# Patient Record
Sex: Female | Born: 1960 | Race: White | Hispanic: No | Smoking: Never smoker
Health system: Southern US, Community
[De-identification: ages and names within clinical notes are randomized; demographics above are authoritative.]

## PROBLEM LIST (undated history)

## (undated) DIAGNOSIS — R87610 Atypical squamous cells of undetermined significance on cytologic smear of cervix (ASC-US): Secondary | ICD-10-CM

## (undated) DIAGNOSIS — Z9889 Other specified postprocedural states: Secondary | ICD-10-CM

## (undated) DIAGNOSIS — D649 Anemia, unspecified: Secondary | ICD-10-CM

## (undated) DIAGNOSIS — J45909 Unspecified asthma, uncomplicated: Secondary | ICD-10-CM

## (undated) DIAGNOSIS — Z87442 Personal history of urinary calculi: Secondary | ICD-10-CM

## (undated) DIAGNOSIS — T7840XA Allergy, unspecified, initial encounter: Secondary | ICD-10-CM

## (undated) DIAGNOSIS — E7800 Pure hypercholesterolemia, unspecified: Secondary | ICD-10-CM

## (undated) DIAGNOSIS — R112 Nausea with vomiting, unspecified: Secondary | ICD-10-CM

## (undated) DIAGNOSIS — J302 Other seasonal allergic rhinitis: Secondary | ICD-10-CM

## (undated) DIAGNOSIS — K219 Gastro-esophageal reflux disease without esophagitis: Secondary | ICD-10-CM

## (undated) DIAGNOSIS — E039 Hypothyroidism, unspecified: Secondary | ICD-10-CM

## (undated) HISTORY — DX: Allergy, unspecified, initial encounter: T78.40XA

## (undated) HISTORY — DX: Gastro-esophageal reflux disease without esophagitis: K21.9

## (undated) HISTORY — DX: Pure hypercholesterolemia, unspecified: E78.00

## (undated) HISTORY — DX: Unspecified asthma, uncomplicated: J45.909

## (undated) HISTORY — DX: Other seasonal allergic rhinitis: J30.2

## (undated) HISTORY — DX: Hypothyroidism, unspecified: E03.9

## (undated) HISTORY — DX: Anemia, unspecified: D64.9

## (undated) HISTORY — DX: Atypical squamous cells of undetermined significance on cytologic smear of cervix (ASC-US): R87.610

## (undated) HISTORY — PX: FOOT SURGERY: SHX648

## (undated) HISTORY — PX: OTHER SURGICAL HISTORY: SHX169

## (undated) HISTORY — PX: COLONOSCOPY: SHX174

---

## 1998-08-12 HISTORY — PX: OTHER SURGICAL HISTORY: SHX169

## 2000-08-07 ENCOUNTER — Encounter (INDEPENDENT_AMBULATORY_CARE_PROVIDER_SITE_OTHER): Payer: Self-pay

## 2000-08-07 ENCOUNTER — Ambulatory Visit (HOSPITAL_COMMUNITY): Admission: RE | Admit: 2000-08-07 | Discharge: 2000-08-07 | Payer: Self-pay | Admitting: Obstetrics and Gynecology

## 2001-08-12 HISTORY — PX: CARPAL TUNNEL RELEASE: SHX101

## 2001-08-12 HISTORY — PX: LAPAROSCOPIC CHOLECYSTECTOMY: SUR755

## 2002-01-27 ENCOUNTER — Ambulatory Visit (HOSPITAL_COMMUNITY): Admission: RE | Admit: 2002-01-27 | Discharge: 2002-01-27 | Payer: Self-pay | Admitting: Gastroenterology

## 2002-01-27 ENCOUNTER — Encounter: Payer: Self-pay | Admitting: Gastroenterology

## 2002-01-29 ENCOUNTER — Encounter: Payer: Self-pay | Admitting: Gastroenterology

## 2002-01-29 ENCOUNTER — Ambulatory Visit (HOSPITAL_COMMUNITY): Admission: RE | Admit: 2002-01-29 | Discharge: 2002-01-29 | Payer: Self-pay | Admitting: Gastroenterology

## 2002-02-23 ENCOUNTER — Observation Stay (HOSPITAL_COMMUNITY): Admission: RE | Admit: 2002-02-23 | Discharge: 2002-02-23 | Payer: Self-pay | Admitting: *Deleted

## 2002-02-23 ENCOUNTER — Encounter (INDEPENDENT_AMBULATORY_CARE_PROVIDER_SITE_OTHER): Payer: Self-pay | Admitting: Specialist

## 2007-01-12 ENCOUNTER — Ambulatory Visit: Payer: Self-pay | Admitting: Gastroenterology

## 2007-01-22 ENCOUNTER — Encounter: Payer: Self-pay | Admitting: Gastroenterology

## 2007-01-22 ENCOUNTER — Ambulatory Visit (HOSPITAL_COMMUNITY): Admission: RE | Admit: 2007-01-22 | Discharge: 2007-01-22 | Payer: Self-pay | Admitting: Gastroenterology

## 2007-01-22 DIAGNOSIS — K222 Esophageal obstruction: Secondary | ICD-10-CM | POA: Insufficient documentation

## 2007-01-30 ENCOUNTER — Ambulatory Visit: Payer: Self-pay | Admitting: Gastroenterology

## 2009-08-29 ENCOUNTER — Ambulatory Visit: Payer: Self-pay | Admitting: Gynecology

## 2009-08-31 ENCOUNTER — Ambulatory Visit: Payer: Self-pay | Admitting: Gynecology

## 2009-12-22 ENCOUNTER — Ambulatory Visit: Payer: Self-pay | Admitting: Gynecology

## 2010-02-06 ENCOUNTER — Ambulatory Visit: Payer: Self-pay | Admitting: Gynecology

## 2010-08-21 ENCOUNTER — Other Ambulatory Visit
Admission: RE | Admit: 2010-08-21 | Discharge: 2010-08-21 | Payer: Self-pay | Source: Home / Self Care | Admitting: Gynecology

## 2010-08-21 ENCOUNTER — Ambulatory Visit
Admission: RE | Admit: 2010-08-21 | Discharge: 2010-08-21 | Payer: Self-pay | Source: Home / Self Care | Attending: Gynecology | Admitting: Gynecology

## 2010-12-25 NOTE — Assessment & Plan Note (Signed)
[facility]                         GASTROENTEROLOGY OFFICE NOTE   NAME:STOVALLGustavia, Carie                       MRN:          [id number]  DATE:01/12/2007                            DOB:          [birth year]    REFERRING PHYSICIAN:  Samuel Jester   REASON FOR CONSULTATION:  Ms. Louis Meckel is a 50 year old, white female  referred through the courtesy of Dr. Charm Barges and Aniceto Boss for  evaluation. She has been complaining of band-like chest discomfort. This  occurred spontaneously. It is without radiation. She is complaining of  dysphagia to solids. Symptoms have improved since taking Omeprazole. She  had been taking milk with some relief. She denies pyrosis, per say. She  is on no gastric irritants including nonsteroidals.   PAST MEDICAL HISTORY:  Pertinent for thyroid disease. She is status post  cholecystectomy.   FAMILY HISTORY:  Pertinent for mother with ovarian cancer. Father has  heart disease.   MEDICATIONS:  Synthroid, omeprazole, Hyoscyamine, Atarax, Zocor, and  Yaz.   ALLERGIES:  TEQUIN.   She neither smokes nor drinks. She is married and works in [employer].   REVIEW OF SYSTEMS:  Positive for night sweats.   PHYSICAL EXAMINATION:  VITAL SIGNS:  Pulse 76, blood pressure 106/78,  weight 155.  HEENT: EOMI. PERRLA. Sclerae are anicteric.  Conjunctivae are pink.  NECK:  Supple without thyromegaly, adenopathy or carotid bruits.  CHEST:  Clear to auscultation and percussion without adventitious  sounds.  CARDIAC:  Regular rhythm; normal S1 S2.  There are no murmurs, gallops  or rubs.  ABDOMEN:  Bowel sounds are normoactive.  Abdomen is soft, non-tender and  non-distended.  There are no abdominal masses, tenderness, splenic  enlargement or hepatomegaly.  EXTREMITIES:  Full range of motion.  No cyanosis, clubbing or edema.  RECTAL:  Deferred.   IMPRESSION:  1. Chest pain. This is likely related to acid reflux with possible      esophageal  spasm.  2. Dysphagia - rule out early esophageal stricture.   RECOMMENDATION:  1. Continue Omeprazole. I instructed Ms. Stovall to take it first      thing in the morning rather      than in the evening.  2. Upper endoscopy with Savary dilatation as indicated.     Barbette Hair. Arlyce Dice, MD,FACG  Electronically Signed    RDK/MedQ  DD: 01/12/2007  DT: 01/12/2007  Job #: [id number]   cc:   Jani Gravel, PA

## 2010-12-25 NOTE — Letter (Signed)
January 12, 2007    Carol Jester, MD  [address]  [place], [place] [address]   RE:  Carol Chambers, Carol Chambers  MRN:  [id number]  /  DOB:  Sep 20, 1960   Dear Dr. Charm Barges:   Upon your kind referral, I had the pleasure of evaluating your patient  and I am pleased to offer my findings.  I saw Carol Chambers in the office  today.  Enclosed is a copy of my progress note that details my findings  and recommendations.   Thank you for the opportunity to participate in your patient's care.    Sincerely,      Barbette Hair. Arlyce Dice, MD,FACG  Electronically Signed    RDK/MedQ  DD: 01/12/2007  DT: 01/12/2007  Job #: [id number]

## 2010-12-28 NOTE — Op Note (Signed)
[facility]  Patient:    Carol Chambers, Carol Chambers Visit Number: [id number] MRN: [id number]          Service Type: SUR Location: 4W [id number] [id number] Attending Physician:  Vikki Ports Proc. Date: 02/23/02 Admit Date:  02/23/2002 Discharge Date: 02/23/2002                             Operative Report  PREOPERATIVE DIAGNOSIS:  Symptomatic cholelithiasis.  POSTOPERATIVE DIAGNOSIS:  Symptomatic cholelithiasis.  PROCEDURE PERFORMED:  Laparoscopic cholecystectomy.  SURGEON:  Vikki Ports, M.D.  ASSISTANT:  Rose Phi. Young, M.D.  DESCRIPTION OF PROCEDURE:  The patient was taken to the operating room and placed in a supine position.  After adequate anesthesia was induced using the endotracheal tube, the abdomen was prepped and draped in normal sterile fashion.  Using a transverse infraumbilical incision, I dissected down to the fascia.  The fascia was opened vertically.  The peritoneum was entered.  An 0 Vicryl pursestring suture was placed around the fascial defect.  A Hasson trocar was placed in the abdomen and pneumoperitoneum was obtained with continuous flow carbon dioxide to a measurement of 15 mmHg.  Under direct visualization, a 10 mm port was placed in the subxiphoid region, two 5 mm ports were placed in the right abdomen.  The gallbladder was identified and had no adhesions, no edema.  It was grasped and retracted cephalad.  Each dissection down near the infundibulum showed a 1 mm cystic duct.  A good window was created behind it until the liver could be visualized.  The cystic duct was triply clipped and divided.  The cystic artery which lay just posterior to this also was dissected free and a good window was created behind it.  It was triply clipped and divided.  The gallbladder was on a nice thin mesentery and was easily dissected off the gallbladder bed and removed through the umbilical port.  Adequate hemostasis was ensured.  The  pneumoperitoneum was released.  The infraumbilical fascial defect was closed with the 0 Vicryl pursestring suture.  Incisions were all injected using 0.5% Marcaine.  The skin incisions were closed with subcuticular 4-0 Monocryl.  Steri-Strips and sterile dressings were applied.  The patient tolerated the procedure well and went the PACU in good condition. Attending Physician:  Danna Hefty R. DD:  02/23/02 TD:  02/25/02 Job: [id number]

## 2010-12-28 NOTE — Op Note (Signed)
[facility]  Patient:    Carol Chambers                         MRN: [id number] Attending:  Katherine Roan, M.D.                           Operative Report  PREOPERATIVE DIAGNOSIS:       Painful occlusion cyst of the right groin.  POSTOPERATIVE DIAGNOSIS:      Painful occlusion cyst of the right groin.  OPERATION:                    Excision of occlusion cyst of the right groin.  SURGEON:                      Katherine Roan, M.D.  DESCRIPTION OF PROCEDURE:     The patient was placed in the lithotomy position, prepped and draped in the usual fashion.  A small cyst was identified on the right labia.  It was infiltrated with 0.5% Marcaine with epinephrine and excised in an elliptical fashion.  The cyst was excised and the skin was closed with 4-0 Dexon.  Hemostasis was accomplished with needlepoint Bovie.  The patient tolerated this procedure well and was sent to the recovery room in good condition. DD:  08/07/00 TD:  08/07/00 Job: [id number]

## 2011-01-28 ENCOUNTER — Ambulatory Visit (INDEPENDENT_AMBULATORY_CARE_PROVIDER_SITE_OTHER): Payer: BC Managed Care – PPO | Admitting: Gynecology

## 2011-01-28 DIAGNOSIS — N898 Other specified noninflammatory disorders of vagina: Secondary | ICD-10-CM

## 2011-01-28 DIAGNOSIS — R350 Frequency of micturition: Secondary | ICD-10-CM

## 2011-01-28 DIAGNOSIS — B373 Candidiasis of vulva and vagina: Secondary | ICD-10-CM

## 2011-09-03 ENCOUNTER — Other Ambulatory Visit (HOSPITAL_COMMUNITY)
Admission: RE | Admit: 2011-09-03 | Discharge: 2011-09-03 | Disposition: A | Discharge status: Home / Self Care | Payer: BC Managed Care – PPO | Source: Ambulatory Visit | Attending: Gynecology | Admitting: Gynecology

## 2011-09-03 ENCOUNTER — Encounter: Payer: Self-pay | Admitting: Gynecology

## 2011-09-03 ENCOUNTER — Ambulatory Visit (INDEPENDENT_AMBULATORY_CARE_PROVIDER_SITE_OTHER): Payer: BC Managed Care – PPO | Admitting: Gynecology

## 2011-09-03 VITALS — BP 114/70 | Ht 63.0 in | Wt 189.0 lb

## 2011-09-03 DIAGNOSIS — N951 Menopausal and female climacteric states: Secondary | ICD-10-CM

## 2011-09-03 DIAGNOSIS — Z13220 Encounter for screening for lipoid disorders: Secondary | ICD-10-CM

## 2011-09-03 DIAGNOSIS — N926 Irregular menstruation, unspecified: Secondary | ICD-10-CM

## 2011-09-03 DIAGNOSIS — Z01419 Encounter for gynecological examination (general) (routine) without abnormal findings: Secondary | ICD-10-CM | POA: Insufficient documentation

## 2011-09-03 DIAGNOSIS — Z131 Encounter for screening for diabetes mellitus: Secondary | ICD-10-CM

## 2011-09-03 DIAGNOSIS — K219 Gastro-esophageal reflux disease without esophagitis: Secondary | ICD-10-CM | POA: Insufficient documentation

## 2011-09-03 LAB — URINALYSIS W MICROSCOPIC + REFLEX CULTURE
Bilirubin Urine: NEGATIVE
Leukocytes, UA: NEGATIVE
Nitrite: NEGATIVE
Protein, ur: NEGATIVE mg/dL
Specific Gravity, Urine: 1.015 (ref 1.005–1.030)
Urobilinogen, UA: NEGATIVE mg/dL (ref 0.0–1.0)

## 2011-09-03 LAB — TSH: TSH: 1.36 u[IU]/mL (ref 0.350–4.500)

## 2011-09-03 LAB — COMPREHENSIVE METABOLIC PANEL
ALT: 14 U/L (ref 0–35)
AST: 17 U/L (ref 0–37)
Albumin: 4.4 g/dL (ref 3.5–5.2)
Alkaline Phosphatase: 71 U/L (ref 39–117)
BUN: 16 mg/dL (ref 6–23)
Calcium: 9.6 mg/dL (ref 8.4–10.5)
Chloride: 103 mEq/L (ref 96–112)
Potassium: 4.5 mEq/L (ref 3.5–5.3)
Sodium: 138 mEq/L (ref 135–145)
Total Protein: 7 g/dL (ref 6.0–8.3)

## 2011-09-03 LAB — CBC WITH DIFFERENTIAL/PLATELET
Basophils Absolute: 0.1 10*3/uL (ref 0.0–0.1)
Eosinophils Relative: 2 % (ref 0–5)
HCT: 43.9 % (ref 36.0–46.0)
Lymphocytes Relative: 26 % (ref 12–46)
Lymphs Abs: 2.5 10*3/uL (ref 0.7–4.0)
MCV: 91.5 fL (ref 78.0–100.0)
Neutro Abs: 6 10*3/uL (ref 1.7–7.7)
Platelets: 296 10*3/uL (ref 150–400)
RBC: 4.8 MIL/uL (ref 3.87–5.11)
RDW: 13.6 % (ref 11.5–15.5)
WBC: 9.5 10*3/uL (ref 4.0–10.5)

## 2011-09-03 LAB — LIPID PANEL: LDL Cholesterol: 121 mg/dL — ABNORMAL HIGH (ref 0–99)

## 2011-09-03 MED ORDER — DIAZEPAM 5 MG PO TABS: 5.0000 mg | ORAL_TABLET | Freq: Three times a day (TID) | ORAL | Status: AC | PRN

## 2011-09-03 MED ORDER — NORETHIN ACE-ETH ESTRAD-FE 1-20 MG-MCG PO TABS: 1.0000 | ORAL_TABLET | Freq: Every day | ORAL | Status: DC

## 2011-09-03 NOTE — Patient Instructions (Signed)
Follow up for lab results.  Return in one year for gyn check up.

## 2011-09-03 NOTE — Progress Notes (Signed)
Carol Chambers 03-Apr-1961 [id number]        51 y.o.  for annual exam.  Doing well. On low dose oral contraceptives. Does note some night sweats throughout the month on and off.  Past medical history,surgical history, medications, allergies, family history and social history were all reviewed and documented in the EPIC chart. ROS:  Was performed and pertinent positives and negatives are included in the history.  Exam: Sherrilyn Rist chaperone present There were no vitals filed for this visit. General appearance  Normal Skin grossly normal Head/Neck normal with no cervical or supraclavicular adenopathy thyroid normal Lungs  clear Cardiac RR, without RMG Abdominal  soft, nontender, without masses, organomegaly or hernia Breasts  examined lying and sitting without masses, retractions, discharge or axillary adenopathy. Pelvic  Ext/BUS/vagina  normal   Cervix  normal  Pap done  Uterus  anteverted, normal size, shape and contour, midline and mobile nontender   Adnexa  Without masses or tenderness    Anus and perineum  normal   Rectovaginal  normal sphincter tone without palpated masses or tenderness.    Assessment/Plan:  51 y.o. female for annual exam.    1. Night sweats. Throughout the month does not seem worse during her pill free week.  No other symptoms such as hair loss skin changes weight changes. She is on thyroid replacement. We'll check baseline FSH, TSH. 2. Irregular menses. Patient does have skips in her periods when she does not have a withdrawal bleed and some that are very light. Certainly may be normal for her low dose pills but will go ahead and check a baseline prolactin along with her FSH TSH. 3. Contraception. Patient wants to continue on low dose oral contraceptives. I again discussed the risks to include stroke heart attack DVT all of which she understands accepts. She does not smoke she's not beening followed for any medical issues and I refilled her times a year with Loestrin 1/20  equivalent. 4. History of vaginismus. She is doing well from that standpoint. She does use Valium to help with muscle relaxation as well as some anxiety issues. I refilled her Valium 5 mg #90 no refill which seems to last year. 5. Breast health. Patient due for her mammogram in April I reminded her to schedule this. SBE monthly discussed. 6. Pap smear. Pap smear was done today as she does not have a number of normals in her chart yet. She has no history of abnormalities in the past.  Assuming this is normal then we may go to a less frequent screening interval will rediscuss next year. 7. Health maintenance. She asked me to do her baseline labs for her internist and that she would fax them to her who is following her for elevated cholesterol and hypothyroidism. I ordered a CBC comprehensive metabolic panel TSH lipid profile and urinalysis. Patient will follow up for all of her labs and I will discuss if any abnormalities otherwise she will see me in a year.    Dara Lords MD, 9:27 AM 09/03/2011

## 2011-09-04 LAB — PROLACTIN: Prolactin: 6.9 ng/mL

## 2011-10-28 ENCOUNTER — Ambulatory Visit
Admission: RE | Admit: 2011-10-28 | Discharge: 2011-10-28 | Disposition: A | Discharge status: Home / Self Care | Payer: BC Managed Care – PPO | Source: Ambulatory Visit | Attending: Allergy and Immunology | Admitting: Allergy and Immunology

## 2011-10-28 ENCOUNTER — Other Ambulatory Visit: Payer: Self-pay | Admitting: Allergy and Immunology

## 2011-10-28 ENCOUNTER — Telehealth: Payer: Self-pay | Admitting: Gastroenterology

## 2011-10-28 DIAGNOSIS — R059 Cough, unspecified: Secondary | ICD-10-CM

## 2011-10-28 DIAGNOSIS — R05 Cough: Secondary | ICD-10-CM

## 2011-10-28 NOTE — Telephone Encounter (Signed)
Pt states that her allergist has been treating her for infections and she has had several medications and steroids. He suggested she see Dr. Arlyce Dice because he thinks she may have damage to her vocal cords or esophagus. Let pt know Dr. Marzetta Board 1st available is in April. Pt scheduled to see Mike Gip PA Thursday 10/31/11@11am . Pt aware of appt date and time.

## 2011-10-31 ENCOUNTER — Encounter: Payer: Self-pay | Admitting: Physician Assistant

## 2011-10-31 ENCOUNTER — Ambulatory Visit (INDEPENDENT_AMBULATORY_CARE_PROVIDER_SITE_OTHER): Payer: BC Managed Care – PPO | Admitting: Physician Assistant

## 2011-10-31 VITALS — BP 110/70 | HR 68 | Ht 63.0 in | Wt 184.0 lb

## 2011-10-31 DIAGNOSIS — E039 Hypothyroidism, unspecified: Secondary | ICD-10-CM

## 2011-10-31 DIAGNOSIS — E785 Hyperlipidemia, unspecified: Secondary | ICD-10-CM

## 2011-10-31 DIAGNOSIS — K219 Gastro-esophageal reflux disease without esophagitis: Secondary | ICD-10-CM

## 2011-10-31 MED ORDER — DEXLANSOPRAZOLE 60 MG PO CPDR: 60.0000 mg | DELAYED_RELEASE_CAPSULE | Freq: Every day | ORAL | Status: DC

## 2011-10-31 NOTE — Patient Instructions (Signed)
We sent a prescription for the Dexilant to [facility].  We have given you some samples today. Take 1 tab 30 min before breakfast. We scheduled the Endoscopy with Dr Arlyce Dice for 11-07-2011.                                                                                                                ENDOSCOPY .  Upper GI endoscopy means using a flexible scope to look at the esophagus, stomach, and upper small bowel. This is done to make a diagnosis in people with heartburn, abdominal pain, or abnormal bleeding. Sometimes an endoscope is needed to remove foreign bodies or food that become stuck in the esophagus; it can also be used to take biopsy samples. For the best results, do not eat or drink for 8 hours before having your upper endoscopy.  To perform the endoscopy, you will probably be sedated and your throat will be numbed with a special spray. The endoscope is then slowly passed down your throat (this will not interfere with your breathing). An endoscopy exam takes 15 to 30 minutes to complete and there is no real pain. Patients rarely remember much about the procedure. The results of the test may take several days if a biopsy or other test is taken.  You may have a sore throat after an endoscopy exam. Serious complications are very rare. Stick to liquids and soft foods until your pain is better. Do not drive a car or operate any dangerous equipment for at least 24 hours after being sedated. SEEK IMMEDIATE MEDICAL CARE IF:   You have severe throat pain.   You have shortness of breath.   You have bleeding problems.   You have a fever.   You have difficulty recovering from your sedation.  Document Released: 09/05/2004 Document Revised: 07/18/2011 Document Reviewed: 07/31/2008 [facility] Patient Information 2012 [place], [place].

## 2011-10-31 NOTE — Progress Notes (Signed)
Subjective:    Patient ID: Carol Chambers, female    DOB: Oct 19, 1960, 51 y.o.   MRN: [id number]  HPI Carol Chambers is very nice 51 year old white female known to Dr. Arlyce Dice from prior endoscopies. She initially had an upper endoscopy done in 2003 which was a normal exam. She then had repeat EGD in June of 2008 with complaints of dysphagia and was found to have a distal esophageal stricture which was balloon dilated to 18 mm. She had good response from last dilation. She says she has had problems with chronic reflux for several years but had done well on Prilosec. Over the past couple of months she has had an episode of bronchitis which has been very difficult to resolve. She is being treated currently by her allergist and says that she has gone through courses of antibiotics, steroids, and inhalers and continues to have complaints of coughing, hoarseness and irritation in her throat. Her allergist suggested that she may be having increasing reflux problems and ask her to be seen by GI.  Patient states that she has had problems swallowing solids over the past couple of months and has actually been avoiding of meat and dry foods for fear of dysphagia.She says she's also having difficulty drinking anything cold because it seems to induce spasms which then causes her to have coughing and choking. She has had some increase in  indigestion but is not having ongoing heartburn. She has been maintained over the past few years on Prilosec 20 mg by mouth daily. She says she is having a lot of problems at night and wakes up nightly with a sense of fluid coming up in her throat which then causes her to choke and have coughing spells. She says she gets to the point she can't stop coughing and then makes a loud wheezing type noise. She is staying hoarse  during the day and also having frequent coughing spells during the day.   Review of Systems  Constitutional: Negative.   HENT: Positive for trouble swallowing and voice change.    Eyes: Negative.   Respiratory: Positive for cough and choking.   Cardiovascular: Negative.   Genitourinary: Negative.   Musculoskeletal: Negative.   Neurological: Negative.  Negative for dizziness.  Hematological: Negative.   Psychiatric/Behavioral: Negative.    Outpatient Prescriptions Prior to Visit  Medication Sig Dispense Refill  . HydrOXYzine HCl (ATARAX PO) Take 25 mg by mouth daily.       Marland Kitchen levothyroxine (SYNTHROID, LEVOTHROID) 112 MCG tablet Take 112 mcg by mouth daily.        Marland Kitchen MAGNESIUM PO Take by mouth.        . naproxen sodium (ANAPROX) 220 MG tablet Take 220 mg by mouth as needed.        . norethindrone-ethinyl estradiol (LOESTRIN FE 1/20) 1-20 MG-MCG tablet Take 1 tablet by mouth daily.  3 Package  4  . omeprazole (PRILOSEC) 20 MG capsule Take 20 mg by mouth daily.      . simvastatin (ZOCOR) 20 MG tablet Take 20 mg by mouth at bedtime.         Allergies  Allergen Reactions  . Sulfa Antibiotics   . Tequin   . Vicodin (Hydrocodone-Acetaminophen)    Patient Active Problem List  Diagnoses  . ESOPHAGEAL STRICTURE  . Acid reflux  . Hypothyroid  . Hyperlipidemia       Objective:   Physical Exam Well-developed white female in no acute distress, pleasant somewhat hoarse and coughing   blood  pressure 110/70 pulse 68 height 5 foot 3  weight 184, HEENT;  Nontraumatic, normocephalic, EOMI PERRLA sclera anicteric conjunctiva pink, voice with hoarse quality, Neck; Supple no JVD, Cardiovascular; regular rate and rhythm with S1-S2 no murmur gallop, Pulmonary; clear bilaterally, Abdomen; sof,t nontender, nondistended, bowel sounds are active there is no palpable mass or hepatosplenomegaly, Rectal; not done, Extremities; no clubbing cyanosis or edema skin warm dry, Psych; mood and affect normal and appropriate.        Assessment & Plan:  #7 51 year old female with history of chronic GERD and prior esophageal stricture dilated in 2008 now presenting with recurrent solid food  dysphagia as well as nocturnal episodes of reflux-induced coughing hoarseness and choking and possible reflux-induced asthmatic symptoms.  Plan; Will schedule for upper endoscopy with esophageal dilation with Dr. Arlyce Dice as soon as possible. Start Dexilant  60 mg by mouth every morning Antireflux regimen was reviewed in detail with the patient including need for elevation of the head of her bed at least 45 and n.p.o. for at least 2 hours prior to bedtime  each night. Further plans will be dependent on her response to dilation and maximal acid suppression with Dexilant

## 2011-11-01 NOTE — Progress Notes (Signed)
Reviewed and agree with management. Robert D. Kaplan, M.D., FACG  

## 2011-11-07 ENCOUNTER — Ambulatory Visit (AMBULATORY_SURGERY_CENTER): Payer: BC Managed Care – PPO | Admitting: Gastroenterology

## 2011-11-07 ENCOUNTER — Encounter: Payer: Self-pay | Admitting: Gastroenterology

## 2011-11-07 VITALS — BP 138/66 | HR 89 | Temp 99.2°F | Resp 99 | Ht 63.0 in | Wt 184.0 lb

## 2011-11-07 DIAGNOSIS — K222 Esophageal obstruction: Secondary | ICD-10-CM

## 2011-11-07 DIAGNOSIS — K219 Gastro-esophageal reflux disease without esophagitis: Secondary | ICD-10-CM

## 2011-11-07 MED ORDER — SODIUM CHLORIDE 0.9 % IV SOLN: 500.0000 mL | INTRAVENOUS | Status: DC

## 2011-11-07 NOTE — Op Note (Signed)
[facility] [address] [place], [place]  [address]  ENDOSCOPY PROCEDURE REPORT  PATIENT:  Carol Chambers, Carol Chambers  MR#:  [id number] BIRTHDATE:  1960-12-22, 51 yrs. old  GENDER:  female  ENDOSCOPIST:  Barbette Hair. Arlyce Dice, MD Referred by:  PROCEDURE DATE:  11/07/2011 PROCEDURE:  EGD, diagnostic 43235, Maloney Dilation of Esophagus ASA CLASS:  Class I INDICATIONS:  dysphagia  MEDICATIONS:   MAC sedation, administered by CRNA propofol 120mg IV, glycopyrrolate (Robinal) 0.2 mg IV, 0.6cc simethancone 0.6 cc PO TOPICAL ANESTHETIC:  DESCRIPTION OF PROCEDURE:   After the risks and benefits of the procedure were explained, informed consent was obtained.  The [facility] [id number] [id number] endoscope was introduced through the mouth and advanced to the third portion of the duodenum.  The instrument was slowly withdrawn as the mucosa was fully examined. <<PROCEDUREIMAGES>>  A stricture was found at the gastroesophageal junction. Early esophageal stricture Dilation with maloney dilator 18mm Mild resistance; no heme  Otherwise the examination was normal (see image2 and image3).    Retroflexed views revealed no abnormalities.    The scope was then withdrawn from the patient and the procedure completed.  COMPLICATIONS:  None  ENDOSCOPIC IMPRESSION: 1) Stricture at the gastroesophageal junction - s/p maloney dilitation 2) GERD 2) Otherwise normal examination RECOMMENDATIONS: 1) continue PPI - dexilant 2) Call office next 2-3 days to schedule an office appointment for 3-4 weeks  ______________________________ Barbette Hair. Arlyce Dice, MD  CC:  Edilia Bo MD  n. Rosalie DoctorBarbette Hair. Abed Schar at 11/07/2011 11:52 AM  Clearance Coots, [phone]

## 2011-11-07 NOTE — Progress Notes (Signed)
HUSBAND NOT PRESENT AT TIME OF MD CONSULTATION FOLLOWING PROCEDURE. CALLED HUSBAND ON CELL NUMBER, NO ANSWER, LEFT MESSAGE FOR HUSBAND TO RETURN TO [facility]. HUSBAND RETURNED AT 1205. CALLED DR. KAPLAN, MESSAGE GIVEN TO PATIENT'S HUSBAND PER MARIE MCCRAW RN. PATIENT STATING SHE REMEMBERS POST PROCEDURE REPORT WITH DR. KAPLAN AND HIS FINDINGS. HUSBAND VERBALIZED SATISFACTION WITH REPORT.

## 2011-11-07 NOTE — Progress Notes (Signed)
PROPOFOL PER [place] AMP CRNA. EWM

## 2011-11-07 NOTE — Progress Notes (Signed)
Patient did not experience any of the following events: a burn prior to discharge; a fall within the facility; wrong site/side/patient/procedure/implant event; or a hospital transfer or hospital admission upon discharge from the facility. (G8907) Patient did not have preoperative order for IV antibiotic SSI prophylaxis. (G8918)  

## 2011-11-07 NOTE — Patient Instructions (Addendum)
OV 3-4 weeks  NOTHING TO EAT OR DRINK UNTIL 1:00 PM. CLEAR LIQUIDS ONLY FROM 1:00 UNTIL 2:00. SOFT FOODS ONLY FOR THE REMAINDER OF THE DAY. RESUME NORMAL DIET IN AM.YOU HAD AN ENDOSCOPIC PROCEDURE TODAY AT THE [facility]: Refer to the procedure report that was given to you for any specific questions about what was found during the examination.  If the procedure report does not answer your questions, please call your gastroenterologist to clarify.  If you requested that your care partner not be given the details of your procedure findings, then the procedure report has been included in a sealed envelope for you to review at your convenience later.  YOU SHOULD EXPECT: Some feelings of bloating in the abdomen. Passage of more gas than usual.  Walking can help get rid of the air that was put into your GI tract during the procedure and reduce the bloating. If you had a lower endoscopy (such as a colonoscopy or flexible sigmoidoscopy) you may notice spotting of blood in your stool or on the toilet paper. If you underwent a bowel prep for your procedure, then you may not have a normal bowel movement for a few days.  DIET: Your first meal following the procedure should be a light meal and then it is ok to progress to your normal diet.  A half-sandwich or bowl of soup is an example of a good first meal.  Heavy or fried foods are harder to digest and may make you feel nauseous or bloated.  Likewise meals heavy in dairy and vegetables can cause extra gas to form and this can also increase the bloating.  Drink plenty of fluids but you should avoid alcoholic beverages for 24 hours.  ACTIVITY: Your care partner should take you home directly after the procedure.  You should plan to take it easy, moving slowly for the rest of the day.  You can resume normal activity the day after the procedure however you should NOT DRIVE or use heavy machinery for 24 hours (because of the sedation medicines used during the  test).    SYMPTOMS TO REPORT IMMEDIATELY: A gastroenterologist can be reached at any hour.  During normal business hours, 8:30 AM to 5:00 PM Monday through Friday, call [phone].  After hours and on weekends, please call the GI answering service at [phone] who will take a message and have the physician on call contact you.   Following lower endoscopy (colonoscopy or flexible sigmoidoscopy):  Excessive amounts of blood in the stool  Significant tenderness or worsening of abdominal pains  Swelling of the abdomen that is new, acute  Fever of 100F or higher  Following upper endoscopy (EGD)  Vomiting of blood or coffee ground material  New chest pain or pain under the shoulder blades  Painful or persistently difficult swallowing  New shortness of breath  Fever of 100F or higher  Black, tarry-looking stools  FOLLOW UP: If any biopsies were taken you will be contacted by phone or by letter within the next 1-3 weeks.  Call your gastroenterologist if you have not heard about the biopsies in 3 weeks.  Our staff will call the home number listed on your records the next business day following your procedure to check on you and address any questions or concerns that you may have at that time regarding the information given to you following your procedure. This is a courtesy call and so if there is no answer at the home number and  we have not heard from you through the emergency physician on call, we will assume that you have returned to your regular daily activities without incident.  SIGNATURES/CONFIDENTIALITY: You and/or your care partner have signed paperwork which will be entered into your electronic medical record.  These signatures attest to the fact that that the information above on your After Visit Summary has been reviewed and is understood.  Full responsibility of the confidentiality of this discharge information lies with you and/or your care-partner.

## 2011-11-11 ENCOUNTER — Telehealth: Payer: Self-pay | Admitting: *Deleted

## 2011-11-11 NOTE — Telephone Encounter (Signed)
  Follow up Call-  Call back number 11/07/2011  Post procedure Call Back phone  # [phone] cell  Permission to leave phone message Yes     No answer at # given.  Message left on voicemail.

## 2011-12-11 ENCOUNTER — Encounter: Payer: Self-pay | Admitting: Gastroenterology

## 2011-12-11 ENCOUNTER — Ambulatory Visit (INDEPENDENT_AMBULATORY_CARE_PROVIDER_SITE_OTHER): Payer: BC Managed Care – PPO | Admitting: Gastroenterology

## 2011-12-11 VITALS — BP 102/72 | HR 76 | Ht 63.0 in | Wt 182.2 lb

## 2011-12-11 DIAGNOSIS — K219 Gastro-esophageal reflux disease without esophagitis: Secondary | ICD-10-CM

## 2011-12-11 DIAGNOSIS — K222 Esophageal obstruction: Secondary | ICD-10-CM

## 2011-12-11 NOTE — Patient Instructions (Signed)
Call back as needed 

## 2011-12-11 NOTE — Assessment & Plan Note (Signed)
Resolved following dilatation

## 2011-12-11 NOTE — Progress Notes (Signed)
History of Present Illness:  Mrs. Carol Chambers has returned following dilatation of a distal esophagus stricture. Dysphagia has subsided. She has stopped coughing since starting dexilant.    Review of Systems: Pertinent positive and negative review of systems were noted in the above HPI section. All other review of systems were otherwise negative.    Current Medications, Allergies, Past Medical History, Past Surgical History, Family History and Social History were reviewed in [employer] electronic medical record  Vital signs were reviewed in today's medical record. Physical Exam: General: Well developed , well nourished, no acute distress

## 2011-12-11 NOTE — Assessment & Plan Note (Signed)
Well controlled with dexilant.

## 2011-12-13 ENCOUNTER — Encounter: Payer: Self-pay | Admitting: Gynecology

## 2012-09-03 ENCOUNTER — Ambulatory Visit (INDEPENDENT_AMBULATORY_CARE_PROVIDER_SITE_OTHER): Payer: BC Managed Care – PPO | Admitting: Gynecology

## 2012-09-03 ENCOUNTER — Encounter: Payer: Self-pay | Admitting: Gynecology

## 2012-09-03 VITALS — BP 120/76 | Ht 62.0 in | Wt 159.0 lb

## 2012-09-03 DIAGNOSIS — Z13220 Encounter for screening for lipoid disorders: Secondary | ICD-10-CM

## 2012-09-03 DIAGNOSIS — N912 Amenorrhea, unspecified: Secondary | ICD-10-CM

## 2012-09-03 DIAGNOSIS — E039 Hypothyroidism, unspecified: Secondary | ICD-10-CM

## 2012-09-03 DIAGNOSIS — N951 Menopausal and female climacteric states: Secondary | ICD-10-CM

## 2012-09-03 DIAGNOSIS — Z01419 Encounter for gynecological examination (general) (routine) without abnormal findings: Secondary | ICD-10-CM

## 2012-09-03 LAB — COMPREHENSIVE METABOLIC PANEL
ALT: 19 U/L (ref 0–35)
CO2: 29 mEq/L (ref 19–32)
Calcium: 9.3 mg/dL (ref 8.4–10.5)
Chloride: 105 mEq/L (ref 96–112)
Creat: 0.91 mg/dL (ref 0.50–1.10)

## 2012-09-03 LAB — CBC WITH DIFFERENTIAL/PLATELET
Eosinophils Absolute: 0.2 10*3/uL (ref 0.0–0.7)
Eosinophils Relative: 2 % (ref 0–5)
HCT: 42.7 % (ref 36.0–46.0)
Hemoglobin: 14.7 g/dL (ref 12.0–15.0)
Lymphs Abs: 2.1 10*3/uL (ref 0.7–4.0)
MCH: 30.2 pg (ref 26.0–34.0)
MCV: 87.9 fL (ref 78.0–100.0)
Monocytes Relative: 9 % (ref 3–12)
RBC: 4.86 MIL/uL (ref 3.87–5.11)

## 2012-09-03 LAB — LIPID PANEL
Cholesterol: 152 mg/dL (ref 0–200)
Total CHOL/HDL Ratio: 2.5 Ratio

## 2012-09-03 LAB — PREGNANCY, URINE: Preg Test, Ur: NEGATIVE

## 2012-09-03 MED ORDER — DIAZEPAM 5 MG PO TABS: 5.0000 mg | ORAL_TABLET | Freq: Four times a day (QID) | ORAL | Status: DC | PRN

## 2012-09-03 NOTE — Progress Notes (Signed)
Carol Chambers 1960/09/03 [id number]        52 y.o.  G1P0010 for annual exam.  Several issues noted below.  Past medical history,surgical history, medications, allergies, family history and social history were all reviewed and documented in the EPIC chart. ROS:  Was performed and pertinent positives and negatives are included in the history.  Exam: Sherrilyn Rist assistant Filed Vitals:   09/03/12 0957  BP: 120/76  Height: 5\' 2"  (1.575 m)  Weight: 159 lb (72.122 kg)   General appearance  Normal Skin grossly normal Head/Neck normal with no cervical or supraclavicular adenopathy thyroid normal Lungs  clear Cardiac RR, without RMG Abdominal  soft, nontender, without masses, organomegaly or hernia Breasts  examined lying and sitting without masses, retractions, discharge or axillary adenopathy. Pelvic  Ext/BUS/vagina  normal   Cervix  normal   Uterus  retroverted, normal size, shape and contour, midline and mobile nontender   Adnexa  Without masses or tenderness    Anus and perineum  normal   Rectovaginal  normal sphincter tone without palpated masses or tenderness.    Assessment/Plan:  52 y.o. G1P0010 female for annual exam.   1. Amenorrhea.  Patient stopped her birth control pills in October and has not had a period since then. She did note her periods were down to just light spotting. Is having some hot flashes and sweats. Check UPT TSH FSH. I reviewed various scenarios. If FSH elevated options for HRT versus observation reviewed. Patient would prefer observation. If FSH is normal I reviewed every 2-3 month progesterone challenge of this coming year and she accepts this. The patient will call for results. 2. Hypothyroidism. On replacement.  Check TSH per above. 3. Contraception. Not actively using contraception. I reviewed the risk of pregnancy even though she is 52 in the perimenopause. I strongly recommended she use condoms at least at this point and she agrees with this. 4. Pap smear 2013.   No Pap smear done today. No history of abnormal Pap smears previously. Plan repeat in 3 years per current screening guidelines. 5. Mammography 12/2011. Recommend repeat annually. SBE monthly reviewed. 6. Colonoscopy. Recommended screening colonoscopy this coming year she turns 52. Patient agrees to arrange. 7. Anxiety. Patient does use Valium 5 mg intermittently throughout the year for stress. #90 mail order prescription given no refill. 8. Health maintenance. Patient actually being seen by her primary for her hypothyroidism and hypercholesterolemia. She did ask if I would do routine labs she will forward these to her primary. I discussed my chart with her and she knows to log on and get copies of her reports to show to her primary physician. CBC comprehensive metabolic panel lipid profile urinalysis ordered along with her TSH and FSH. Follow up for lab results and will triage based on these results. Otherwise follow up in one year.    Dara Lords MD, 10:29 AM 09/03/2012

## 2012-09-03 NOTE — Patient Instructions (Signed)
Follow up for lab work through My Chart Follow up for annual exam in one year

## 2012-09-04 LAB — URINALYSIS W MICROSCOPIC + REFLEX CULTURE
Bacteria, UA: NONE SEEN
Glucose, UA: NEGATIVE mg/dL
Hgb urine dipstick: NEGATIVE
Ketones, ur: NEGATIVE mg/dL
Leukocytes, UA: NEGATIVE
Nitrite: NEGATIVE
Specific Gravity, Urine: 1.01 (ref 1.005–1.030)
Urobilinogen, UA: 0.2 mg/dL (ref 0.0–1.0)
pH: 7 (ref 5.0–8.0)

## 2012-09-04 LAB — FOLLICLE STIMULATING HORMONE: FSH: 143.2 m[IU]/mL — ABNORMAL HIGH

## 2012-09-04 LAB — TSH: TSH: 0.613 u[IU]/mL (ref 0.350–4.500)

## 2012-09-06 ENCOUNTER — Encounter: Payer: Self-pay | Admitting: Gastroenterology

## 2012-09-07 ENCOUNTER — Encounter: Payer: Self-pay | Admitting: Gastroenterology

## 2012-09-11 ENCOUNTER — Encounter: Payer: Self-pay | Admitting: Gynecology

## 2012-09-14 ENCOUNTER — Telehealth: Payer: Self-pay

## 2012-09-14 NOTE — Telephone Encounter (Signed)
Patient had sent email to [place] via My Chart on 09/07/12 saying her lab results had not yet appeared in My Chart.  I emailed her back and told her we would release them here at the office and Sherrilyn Rist did so that day.  Patient is calling today to say that she still has not been able to get them.  I printed results and mailed them to patient today.

## 2012-09-25 ENCOUNTER — Other Ambulatory Visit: Payer: Self-pay | Admitting: Gynecology

## 2012-09-25 ENCOUNTER — Other Ambulatory Visit: Payer: Self-pay

## 2012-09-25 MED ORDER — DIAZEPAM 5 MG PO TABS: ORAL_TABLET | ORAL | Status: DC

## 2012-09-25 NOTE — Telephone Encounter (Signed)
This refill was documented in error as denied.

## 2012-09-26 ENCOUNTER — Other Ambulatory Visit: Payer: Self-pay

## 2012-10-02 ENCOUNTER — Ambulatory Visit: Payer: BC Managed Care – PPO | Admitting: Gastroenterology

## 2012-10-08 ENCOUNTER — Encounter: Payer: Self-pay | Admitting: Gastroenterology

## 2012-10-08 ENCOUNTER — Ambulatory Visit (AMBULATORY_SURGERY_CENTER): Payer: BC Managed Care – PPO | Admitting: *Deleted

## 2012-10-08 VITALS — Ht 62.0 in | Wt 169.6 lb

## 2012-10-08 DIAGNOSIS — Z1211 Encounter for screening for malignant neoplasm of colon: Secondary | ICD-10-CM

## 2012-10-08 MED ORDER — NA SULFATE-K SULFATE-MG SULF 17.5-3.13-1.6 GM/177ML PO SOLN
ORAL | Status: DC
Start: 2012-10-08 — End: 2012-10-22

## 2012-10-22 ENCOUNTER — Encounter: Payer: Self-pay | Admitting: Gastroenterology

## 2012-10-22 ENCOUNTER — Ambulatory Visit (AMBULATORY_SURGERY_CENTER): Payer: BC Managed Care – PPO | Admitting: Gastroenterology

## 2012-10-22 VITALS — BP 105/72 | HR 68 | Temp 97.7°F | Resp 14

## 2012-10-22 DIAGNOSIS — Z1211 Encounter for screening for malignant neoplasm of colon: Secondary | ICD-10-CM

## 2012-10-22 MED ORDER — SODIUM CHLORIDE 0.9 % IV SOLN: 500.0000 mL | INTRAVENOUS | Status: DC

## 2012-10-22 NOTE — Progress Notes (Signed)
Report to pacu rn, vss, bbs=clear 

## 2012-10-22 NOTE — Progress Notes (Signed)
After giving the pt levsin, pt states pain is now a 3/10 and she thinks she can get the rest of her air out at home, pt states it is much better than it was before, letting pt leave   Patient did not experience any of the following events: a burn prior to discharge; a fall within the facility; wrong site/side/patient/procedure/implant event; or a hospital transfer or hospital admission upon discharge from the facility. (G8907)Patient did not have preoperative order for IV antibiotic SSI prophylaxis. [id number])

## 2012-10-22 NOTE — Op Note (Signed)
[facility] [address] [place], [address]   COLONOSCOPY PROCEDURE REPORT  PATIENT: Carol Chambers, Carol Chambers  MR#: [id number] BIRTHDATE: 1961/05/07 , 52  yrs. old GENDER: Female ENDOSCOPIST: Louis Meckel, MD REFERRED AO:[id number] Fontaine, M.D. PROCEDURE DATE:  10/22/2012 PROCEDURE:   Colonoscopy, diagnostic ASA CLASS:   Class I INDICATIONS:average risk screening. MEDICATIONS: MAC sedation, administered by CRNA and propofol (Diprivan) 250mg  IV  DESCRIPTION OF PROCEDURE:   After the risks benefits and alternatives of the procedure were thoroughly explained, informed consent was obtained.  A digital rectal exam revealed no abnormalities of the rectum.   The LB [id number]-[id number]  endoscope was introduced through the anus and advanced to the cecum, which was identified by both the appendix and ileocecal valve. No adverse events experienced.   The quality of the prep was Suprep excellent The instrument was then slowly withdrawn as the colon was fully examined.      COLON FINDINGS: A normal appearing cecum, ileocecal valve, and appendiceal orifice were identified.  The ascending, hepatic flexure, transverse, splenic flexure, descending, sigmoid colon and rectum appeared unremarkable.  No polyps or cancers were seen. Retroflexed views revealed no abnormalities. The time to cecum=4 minutes 23 seconds.  Withdrawal time=8 minutes 59 seconds.  The scope was withdrawn and the procedure completed. COMPLICATIONS: There were no complications.  ENDOSCOPIC IMPRESSION: Normal colon  RECOMMENDATIONS: Continue current colorectal screening recommendations for "routine risk" patients with a repeat colonoscopy in 10 years.   eSigned:  Louis Meckel, MD 10/22/2012 10:15 AM   cc:

## 2012-10-22 NOTE — Patient Instructions (Addendum)
YOU HAD AN ENDOSCOPIC PROCEDURE TODAY AT THE [facility]: Refer to the procedure report that was given to you for any specific questions about what was found during the examination.  If the procedure report does not answer your questions, please call your gastroenterologist to clarify.  If you requested that your care partner not be given the details of your procedure findings, then the procedure report has been included in a sealed envelope for you to review at your convenience later.  YOU SHOULD EXPECT: Some feelings of bloating in the abdomen. Passage of more gas than usual.  Walking can help get rid of the air that was put into your GI tract during the procedure and reduce the bloating. If you had a lower endoscopy (such as a colonoscopy or flexible sigmoidoscopy) you may notice spotting of blood in your stool or on the toilet paper. If you underwent a bowel prep for your procedure, then you may not have a normal bowel movement for a few days.  DIET: Your first meal following the procedure should be a light meal and then it is ok to progress to your normal diet.  A half-sandwich or bowl of soup is an example of a good first meal.  Heavy or fried foods are harder to digest and may make you feel nauseous or bloated.  Likewise meals heavy in dairy and vegetables can cause extra gas to form and this can also increase the bloating.  Drink plenty of fluids but you should avoid alcoholic beverages for 24 hours.  ACTIVITY: Your care partner should take you home directly after the procedure.  You should plan to take it easy, moving slowly for the rest of the day.  You can resume normal activity the day after the procedure however you should NOT DRIVE or use heavy machinery for 24 hours (because of the sedation medicines used during the test).    SYMPTOMS TO REPORT IMMEDIATELY: A gastroenterologist can be reached at any hour.  During normal business hours, 8:30 AM to 5:00 PM Monday through Friday,  call [phone].  After hours and on weekends, please call the GI answering service at [phone] who will take a message and have the physician on call contact you.   Following lower endoscopy (colonoscopy or flexible sigmoidoscopy):  Excessive amounts of blood in the stool  Significant tenderness or worsening of abdominal pains  Swelling of the abdomen that is new, acute  Fever of 100F or higher  FOLLOW UP: If any biopsies were taken you will be contacted by phone or by letter within the next 1-3 weeks.  Call your gastroenterologist if you have not heard about the biopsies in 3 weeks.  Our staff will call the home number listed on your records the next business day following your procedure to check on you and address any questions or concerns that you may have at that time regarding the information given to you following your procedure. This is a courtesy call and so if there is no answer at the home number and we have not heard from you through the emergency physician on call, we will assume that you have returned to your regular daily activities without incident.  SIGNATURES/CONFIDENTIALITY: You and/or your care partner have signed paperwork which will be entered into your electronic medical record.  These signatures attest to the fact that that the information above on your After Visit Summary has been reviewed and is understood.  Full responsibility of the confidentiality of this   discharge information lies with you and/or your care-partner.  Repeat colonoscopy in 10 years.  

## 2012-10-22 NOTE — Progress Notes (Signed)
When pt got up to get dressed pt started cramping and have abd pain rates 6/10, let pt go to restroom to see if she could get anymore air out, pt states she passed some gas and liquid, gave pt levsin 0.25mg  and sat pt in room off to side since she was already off monitor

## 2012-10-23 ENCOUNTER — Telehealth: Payer: Self-pay | Admitting: *Deleted

## 2012-10-23 NOTE — Telephone Encounter (Signed)
No answer left message to call if questions or concerns. 

## 2012-12-14 ENCOUNTER — Encounter: Payer: Self-pay | Admitting: Gynecology

## 2013-06-09 ENCOUNTER — Telehealth: Payer: Self-pay | Admitting: Gastroenterology

## 2013-06-09 NOTE — Telephone Encounter (Signed)
Pt states she thinks she is having problems with her esophagus again. States that she has seen her PCP and they think the cough that she is having is related to her esophagus. Pt states she is taking her prilosec and sleeping with the head of her bed elevated. Pt requesting to be seen. Pt scheduled to see Dr. Arlyce Dice 06/23/13@8 :45am. Pt aware of appt.

## 2013-06-17 ENCOUNTER — Other Ambulatory Visit: Payer: Self-pay

## 2013-06-23 ENCOUNTER — Ambulatory Visit (INDEPENDENT_AMBULATORY_CARE_PROVIDER_SITE_OTHER): Payer: BC Managed Care – PPO | Admitting: Gastroenterology

## 2013-06-23 ENCOUNTER — Encounter: Payer: Self-pay | Admitting: Gastroenterology

## 2013-06-23 VITALS — BP 100/66 | HR 60 | Ht 62.0 in | Wt 167.4 lb

## 2013-06-23 DIAGNOSIS — R059 Cough, unspecified: Secondary | ICD-10-CM | POA: Insufficient documentation

## 2013-06-23 DIAGNOSIS — R05 Cough: Secondary | ICD-10-CM | POA: Insufficient documentation

## 2013-06-23 NOTE — Assessment & Plan Note (Signed)
Patient's developed a chronic cough with the need to clear her throat, hoarseness with only occasional pyrosis.  Symptoms could be do to ongoing acid reflux.  Cyclical coughing is also a possibility.  Recommendations #1 trial of dexilant 60 mg q. a.m. and before dinner; if not improved in the next 2 weeks I will obtain a gastric emptying scan.  Finally, would consider esophageal pH testing if symptoms continue and tests are not diagnostic

## 2013-06-23 NOTE — Patient Instructions (Signed)
Take Dexilant twice a day Call back in 2 weeks to let us know how your doing

## 2013-06-23 NOTE — Progress Notes (Signed)
History of Present Illness:  Pleasant 52 year old white female with history of esophageal stricture referred for evaluation of coughing and hoarseness.  She has occasional pyrosis.  She's taking Prilosec daily.  She denies dysphagia or sore throat. She takes diclofenac a couple times a week    Review of Systems: Pertinent positive and negative review of systems were noted in the above HPI section. All other review of systems were otherwise negative.    Current Medications, Allergies, Past Medical History, Past Surgical History, Family History and Social History were reviewed in [employer] electronic medical record  Vital signs were reviewed in today's medical record. Physical Exam: General: Well developed , well nourished, no acute distress Skin: anicteric Head: Normocephalic and atraumatic Eyes:  sclerae anicteric, EOMI Ears: Normal auditory acuity Mouth: No deformity or lesions Lungs: Clear throughout to auscultation Heart: Regular rate and rhythm; no murmurs, rubs or bruits Abdomen: Soft, non tender and non distended. No masses, hepatosplenomegaly or hernias noted. Normal Bowel sounds Rectal:deferred Musculoskeletal: Symmetrical with no gross deformities  Pulses:  Normal pulses noted Extremities: No clubbing, cyanosis, edema or deformities noted Neurological: Alert oriented x 4, grossly nonfocal Psychological:  Alert and cooperative. Normal mood and affect

## 2013-07-01 ENCOUNTER — Telehealth: Payer: Self-pay | Admitting: Gastroenterology

## 2013-07-01 MED ORDER — DEXLANSOPRAZOLE 60 MG PO CPDR: 60.0000 mg | DELAYED_RELEASE_CAPSULE | Freq: Every day | ORAL | Status: DC

## 2013-07-01 NOTE — Telephone Encounter (Signed)
Called the patient to explain I would send in the prescription to her pharmacy  And that the insurance may not pay for Dexilant  I offered her samples but she said she lives to far away to pick them up

## 2013-07-06 ENCOUNTER — Telehealth: Payer: Self-pay | Admitting: Gastroenterology

## 2013-07-06 NOTE — Telephone Encounter (Signed)
Spoke with pt and she states she started taking the Dexilant 06/23/13. Discussed with pt that it probably was not the Dexilant if she has been taking it for 2 weeks. Instructed pt to call her PCP.

## 2013-09-06 ENCOUNTER — Encounter: Payer: BC Managed Care – PPO | Admitting: Gynecology

## 2013-09-10 ENCOUNTER — Encounter: Payer: Self-pay | Admitting: Gynecology

## 2013-09-10 ENCOUNTER — Other Ambulatory Visit: Payer: Self-pay | Admitting: Gynecology

## 2013-09-10 ENCOUNTER — Ambulatory Visit (INDEPENDENT_AMBULATORY_CARE_PROVIDER_SITE_OTHER): Payer: BC Managed Care – PPO | Admitting: Gynecology

## 2013-09-10 VITALS — BP 120/76 | Ht 63.0 in | Wt 167.0 lb

## 2013-09-10 DIAGNOSIS — E039 Hypothyroidism, unspecified: Secondary | ICD-10-CM

## 2013-09-10 DIAGNOSIS — Z01419 Encounter for gynecological examination (general) (routine) without abnormal findings: Secondary | ICD-10-CM

## 2013-09-10 DIAGNOSIS — L989 Disorder of the skin and subcutaneous tissue, unspecified: Secondary | ICD-10-CM

## 2013-09-10 DIAGNOSIS — N951 Menopausal and female climacteric states: Secondary | ICD-10-CM

## 2013-09-10 LAB — LIPID PANEL
CHOL/HDL RATIO: 2.3 ratio
CHOLESTEROL: 176 mg/dL (ref 0–200)
HDL: 77 mg/dL (ref >39)
LDL Cholesterol: 85 mg/dL (ref 0–99)
Triglycerides: 69 mg/dL (ref <150)
VLDL: 14 mg/dL (ref 0–40)

## 2013-09-10 LAB — COMPREHENSIVE METABOLIC PANEL
ALT: 15 U/L (ref 0–35)
AST: 15 U/L (ref 0–37)
Albumin: 4.4 g/dL (ref 3.5–5.2)
Alkaline Phosphatase: 77 U/L (ref 39–117)
BILIRUBIN TOTAL: 0.7 mg/dL (ref 0.2–1.2)
BUN: 13 mg/dL (ref 6–23)
CALCIUM: 9.5 mg/dL (ref 8.4–10.5)
CHLORIDE: 104 meq/L (ref 96–112)
CO2: 30 meq/L (ref 19–32)
Creat: 0.93 mg/dL (ref 0.50–1.10)
Glucose, Bld: 91 mg/dL (ref 70–99)
Potassium: 4.4 mEq/L (ref 3.5–5.3)
SODIUM: 140 meq/L (ref 135–145)
TOTAL PROTEIN: 6.7 g/dL (ref 6.0–8.3)

## 2013-09-10 LAB — CBC WITH DIFFERENTIAL/PLATELET
BASOS ABS: 0 10*3/uL (ref 0.0–0.1)
Basophils Relative: 1 % (ref 0–1)
Eosinophils Absolute: 0.1 10*3/uL (ref 0.0–0.7)
Eosinophils Relative: 2 % (ref 0–5)
HCT: 42.4 % (ref 36.0–46.0)
Hemoglobin: 14.4 g/dL (ref 12.0–15.0)
LYMPHS ABS: 2.1 10*3/uL (ref 0.7–4.0)
LYMPHS PCT: 35 % (ref 12–46)
MCH: 30.1 pg (ref 26.0–34.0)
MCHC: 34 g/dL (ref 30.0–36.0)
MCV: 88.7 fL (ref 78.0–100.0)
Monocytes Absolute: 0.6 10*3/uL (ref 0.1–1.0)
Monocytes Relative: 10 % (ref 3–12)
NEUTROS ABS: 3.2 10*3/uL (ref 1.7–7.7)
NEUTROS PCT: 52 % (ref 43–77)
PLATELETS: 265 10*3/uL (ref 150–400)
RBC: 4.78 MIL/uL (ref 3.87–5.11)
RDW: 13.9 % (ref 11.5–15.5)
WBC: 6 10*3/uL (ref 4.0–10.5)

## 2013-09-10 MED ORDER — PROGESTERONE MICRONIZED 100 MG PO CAPS: 100.0000 mg | ORAL_CAPSULE | Freq: Every day | ORAL | Status: DC

## 2013-09-10 MED ORDER — DIAZEPAM 5 MG PO TABS: ORAL_TABLET | ORAL | Status: DC

## 2013-09-10 MED ORDER — ESTRADIOL 0.075 MG/24HR TD PTTW: 1.0000 | MEDICATED_PATCH | TRANSDERMAL | Status: DC

## 2013-09-10 NOTE — Progress Notes (Signed)
Carol Chambers 25-Jul-1961 [id number]        52 y.o.  G1P0010 for annual exam.  Several issues and below.  Past medical history,surgical history, problem list, medications, allergies, family history and social history were all reviewed and documented in the EPIC chart.  ROS:  Performed and pertinent positives and negatives are included in the history, assessment and plan .  Exam: Kim assistant Filed Vitals:   09/10/13 0929  BP: 120/76  Height: 5\' 3"  (1.6 m)  Weight: 167 lb (75.751 kg)   General appearance  Normal Skin grossly normal excepting small raised hyperkeratotic papule in her upper thigh. Head/Neck normal with no cervical or supraclavicular adenopathy thyroid normal Lungs  clear Cardiac RR, without RMG Abdominal  soft, nontender, without masses, organomegaly or hernia Breasts  examined lying and sitting without masses, retractions, discharge or axillary adenopathy. Pelvic  Ext/BUS/vagina  Normal  Cervix  Normal  Uterus  Anteverted, normal size, shape and contour, midline and mobile nontender   Adnexa  Without masses or tenderness    Anus and perineum  Normal   Rectovaginal  Normal sphincter tone without palpated masses or tenderness.    Assessment/Plan:  53 y.o. G22P0010 female for annual exam.   1. Postmenopausal/menopausal symptoms. Patient having worsening hot flushes and night sweats. No significant vaginal dryness or dyspareunia. Finding it difficult to sleep at night. No vaginal bleeding. Options reviewed to include OTC products, pharmacologic nonhormonal such as Effexor and HRT.  I reviewed the whole issue of HRT with her to include the WHI study with increased risk of stroke, heart attack, DVT and breast cancer. The ACOG and NAMS statements for lowest dose for the shortest period of time reviewed. Transdermal versus oral first-pass effect benefit discussed. After lengthy discussion the patient wants to start HRT and we will start with Minivelle 0.075 twice weekly and  Prometrium 100 mg at bedtime. Annual prescription for both written. Patient will call me if she has any issues after initiation. Patient knows to call me if she does any vaginal bleeding. 2. New raised papule in her upper left thigh. Noticed for about a month. Hyperkeratotic appearance. Recommended removal and patient agrees per above note. Patient will followup for biopsy results. 3. Pap smear 2013. No Pap smear done today. No history of abnormal Pap smears previously. Plan repeat Pap smear next year 3 year interval. 4. Mammography 12/2012. Repeat this coming May. SBE monthly reviewed. 5. Colonoscopy 2014. Repeat at their recommended interval. 6. DEXA never. Plan further into the menopause. Increase calcium vitamin D reviewed. Check vitamin D level today. 7. Health maintenance. Baseline CBC comprehensive metabolic panel lipid profile urinalysis TSH vitamin D ordered. Followup if any issues with HRT otherwise annually.   Note: This document was prepared with digital dictation and possible smart phrase technology. Any transcriptional errors that result from this process are unintentional.   Anastasio Auerbach MD, 10:10 AM 09/10/2013

## 2013-09-10 NOTE — Addendum Note (Signed)
Addended by: Anastasio Auerbach on: 09/10/2013 10:18 AM   Modules accepted: Orders

## 2013-09-10 NOTE — Patient Instructions (Signed)
Start on hormone replacement as we discussed. Call me if you have any issues at all. Office will call you with the biopsy results.

## 2013-09-11 LAB — URINALYSIS W MICROSCOPIC + REFLEX CULTURE
BILIRUBIN URINE: NEGATIVE
CRYSTALS: NONE SEEN
Casts: NONE SEEN
Glucose, UA: NEGATIVE mg/dL
Hgb urine dipstick: NEGATIVE
Ketones, ur: NEGATIVE mg/dL
Nitrite: NEGATIVE
PROTEIN: NEGATIVE mg/dL
SPECIFIC GRAVITY, URINE: 1.011 (ref 1.005–1.030)
UROBILINOGEN UA: 0.2 mg/dL (ref 0.0–1.0)
pH: 7.5 (ref 5.0–8.0)

## 2013-09-11 LAB — VITAMIN D 25 HYDROXY (VIT D DEFICIENCY, FRACTURES): Vit D, 25-Hydroxy: 43 ng/mL (ref 30–89)

## 2013-09-11 LAB — TSH: TSH: 0.514 u[IU]/mL (ref 0.350–4.500)

## 2013-09-13 LAB — URINE CULTURE

## 2013-09-14 ENCOUNTER — Encounter: Payer: Self-pay | Admitting: Gynecology

## 2013-09-14 NOTE — Telephone Encounter (Signed)
The patches are to be used twice weekly. If cost is an issue 2 options would be to go to a generic weekly patch which is larger but cheaper or going to the estrogen pills which I think given her clinical situation does not carry a much higher risk of blood clots which is the real issue as far as patch over 12. The pills are very cheap.

## 2014-01-04 ENCOUNTER — Encounter: Payer: Self-pay | Admitting: Gynecology

## 2014-05-27 ENCOUNTER — Other Ambulatory Visit: Payer: Self-pay

## 2014-06-13 ENCOUNTER — Encounter: Payer: Self-pay | Admitting: Gynecology

## 2014-07-02 ENCOUNTER — Other Ambulatory Visit: Payer: Self-pay | Admitting: Gastroenterology

## 2014-07-13 ENCOUNTER — Ambulatory Visit (INDEPENDENT_AMBULATORY_CARE_PROVIDER_SITE_OTHER): Payer: BC Managed Care – PPO | Admitting: Gastroenterology

## 2014-07-13 ENCOUNTER — Encounter: Payer: Self-pay | Admitting: Gastroenterology

## 2014-07-13 VITALS — BP 110/60 | HR 66 | Ht 62.0 in | Wt 182.6 lb

## 2014-07-13 DIAGNOSIS — R059 Cough, unspecified: Secondary | ICD-10-CM

## 2014-07-13 DIAGNOSIS — K219 Gastro-esophageal reflux disease without esophagitis: Secondary | ICD-10-CM

## 2014-07-13 DIAGNOSIS — R05 Cough: Secondary | ICD-10-CM

## 2014-07-13 MED ORDER — DEXLANSOPRAZOLE 60 MG PO CPDR: 60.0000 mg | DELAYED_RELEASE_CAPSULE | Freq: Every day | ORAL | Status: DC

## 2014-07-13 NOTE — Progress Notes (Signed)
      History of Present Illness:  [name] has returned for reevaluation of a chronic cough.  She has a chronic nonproductive cough occurs throughout the day and night.  She will awaken coughing.  She's taking Robitussin without much improvement.  She also complains of frequent postprandial belching.  Food will come up into her chest.  She denies pyrosis.  He takes dexilant once daily.  She denies dysphagia.  In 2013 she underwent dilation of an early distal esophageal stricture     Review of Systems: Pertinent positive and negative review of systems were noted in the above HPI section. All other review of systems were otherwise negative.    Current Medications, Allergies, Past Medical History, Past Surgical History, Family History and Social History were reviewed in [facility] record  Vital signs were reviewed in today's medical record. Physical Exam: General: Well developed , well nourished, no acute distress Skin: anicteric Head: Normocephalic and atraumatic Eyes:  sclerae anicteric, EOMI Ears: Normal auditory acuity Mouth: No deformity or lesions Lungs: Clear throughout to auscultation Heart: Regular rate and rhythm; no murmurs, rubs or bruits Abdomen: Soft, non tender and non distended. No masses, hepatosplenomegaly or hernias noted. Normal Bowel sounds Rectal:deferred Musculoskeletal: Symmetrical with no gross deformities  Pulses:  Normal pulses noted Extremities: No clubbing, cyanosis, edema or deformities noted Neurological: Alert oriented x 4, grossly nonfocal Psychological:  Alert and cooperative. Normal mood and affect  See Assessment and Plan under Problem List

## 2014-07-13 NOTE — Patient Instructions (Signed)
You have been scheduled for a gastric emptying scan at [facility] on 08/01/2014 7:30am  at [facility] . Please arrive at least 15 minutes prior to your appointment for registration. Please make certain not to have anything to eat or drink after midnight the night before your test. Hold all stomach medications (ex: Zofran, phenergan, Reglan) 48 hours prior to your test. If you need to reschedule your appointment, please contact radiology scheduling at [phone]. _____________________________________________________________________ A gastric-emptying study measures how long it takes for food to move through your stomach. There are several ways to measure stomach emptying. In the most common test, you eat food that contains a small amount of radioactive material. A scanner that detects the movement of the radioactive material is placed over your abdomen to monitor the rate at which food leaves your stomach. This test normally takes about 2 hours to complete. _____________________________________________________________________   Refill Dexilant Call us back in 2 weeks and let us know how you are doing

## 2014-07-13 NOTE — Assessment & Plan Note (Signed)
Chronic cough could be a cyclical cough.  Chronic  esophageal reflux is also a concern.  If the latter, she has not responded to medical therapy.  Recommendations #1 increase dexilant to every morning and daily at bedtime #2 gastric emptying scan #3 if she's not improved and scan is negative then I will obtain a 48 hour bravo pH study off medicines together with impedance plethysmography

## 2014-07-19 ENCOUNTER — Telehealth: Payer: Self-pay | Admitting: Gastroenterology

## 2014-07-19 NOTE — Telephone Encounter (Signed)
Called pharmacy    They stated pt said she is suppose to get twice daily dexilant  I see that increase in Dr Guillermina [place] office note but Dr Deatra Ina sent it in for once a day  Dr Berna Spare is not going to pay for twice a day. I just spoke to pharmacist. We sent in her script for once a day   What do you want to do. Send in something new?? Insurance will not cover twice daily dexilant

## 2014-07-20 NOTE — Telephone Encounter (Signed)
Spoke with the pharmacist at [facility]  He said her insurance will pay for another prescription on the 16th.  I told her to go ahead and take the twice daily and if she runs out of her medication before then to contact the pharmacy and they said they will help her with giving her a few dexilant until the 16th. Patient understands and will call us if the twice a day is helping her

## 2014-07-20 NOTE — Telephone Encounter (Signed)
She needs to try twice a day dexilant for 1-2 weeks only then call back regarding status of cough.

## 2014-07-27 ENCOUNTER — Telehealth: Payer: Self-pay | Admitting: Gastroenterology

## 2014-07-27 NOTE — Telephone Encounter (Signed)
Patient has a Gastric Emptying scan on 08/01/14.

## 2014-08-01 ENCOUNTER — Ambulatory Visit (HOSPITAL_COMMUNITY)
Admission: RE | Admit: 2014-08-01 | Discharge: 2014-08-01 | Disposition: A | Discharge status: Home / Self Care | Payer: BC Managed Care – PPO | Source: Ambulatory Visit | Attending: Gastroenterology | Admitting: Gastroenterology

## 2014-08-01 DIAGNOSIS — K219 Gastro-esophageal reflux disease without esophagitis: Secondary | ICD-10-CM

## 2014-08-01 DIAGNOSIS — R140 Abdominal distension (gaseous): Secondary | ICD-10-CM | POA: Insufficient documentation

## 2014-08-01 MED ORDER — TECHNETIUM TC 99M SULFUR COLLOID
2.1000 | Freq: Once | INTRAVENOUS | Status: AC | PRN
  Administered 2014-08-01: 2.1 via ORAL

## 2014-08-02 ENCOUNTER — Other Ambulatory Visit: Payer: Self-pay

## 2014-08-02 ENCOUNTER — Telehealth: Payer: Self-pay | Admitting: Gastroenterology

## 2014-08-02 DIAGNOSIS — K2100 Gastro-esophageal reflux disease with esophagitis, without bleeding: Secondary | ICD-10-CM

## 2014-08-03 ENCOUNTER — Other Ambulatory Visit: Payer: Self-pay

## 2014-08-03 DIAGNOSIS — R1013 Epigastric pain: Secondary | ICD-10-CM

## 2014-08-03 NOTE — Telephone Encounter (Signed)
Advised of her GES results. 48hr Bravo with impedance plethysmography scheduled for 08/23/14. Instructions mailed to the patient.

## 2014-08-10 ENCOUNTER — Encounter (HOSPITAL_COMMUNITY): Payer: Self-pay | Admitting: *Deleted

## 2014-08-11 ENCOUNTER — Telehealth: Payer: Self-pay | Admitting: Gastroenterology

## 2014-08-11 NOTE — Telephone Encounter (Signed)
She is advised to hold the Dexilant for 7 days prior to the study.

## 2014-08-17 ENCOUNTER — Telehealth: Payer: Self-pay | Admitting: Gastroenterology

## 2014-08-17 NOTE — Telephone Encounter (Signed)
Patient notified of recommendation. 

## 2014-08-17 NOTE — Telephone Encounter (Signed)
Resume dexilant and stop it 48 hours before test

## 2014-08-17 NOTE — Telephone Encounter (Signed)
Spoke with patient and she states she is coughing since she stopped the Dexilant 2 days ago(has to be off until 1/l12/16 for test). She is having problems working due to coughing. Please, advise.

## 2014-08-23 ENCOUNTER — Encounter (HOSPITAL_COMMUNITY): Payer: Self-pay | Admitting: *Deleted

## 2014-08-23 ENCOUNTER — Ambulatory Visit (HOSPITAL_COMMUNITY): Payer: BLUE CROSS/BLUE SHIELD | Admitting: Anesthesiology

## 2014-08-23 ENCOUNTER — Ambulatory Visit (HOSPITAL_COMMUNITY)
Admission: RE | Admit: 2014-08-23 | Discharge: 2014-08-23 | Disposition: A | Discharge status: Home / Self Care | Payer: BLUE CROSS/BLUE SHIELD | Source: Ambulatory Visit | Attending: Gastroenterology | Admitting: Gastroenterology

## 2014-08-23 ENCOUNTER — Encounter (HOSPITAL_COMMUNITY)
Admission: RE | Disposition: A | Discharge status: Home / Self Care | Payer: Self-pay | Source: Ambulatory Visit | Attending: Gastroenterology

## 2014-08-23 DIAGNOSIS — R05 Cough: Secondary | ICD-10-CM | POA: Insufficient documentation

## 2014-08-23 DIAGNOSIS — K259 Gastric ulcer, unspecified as acute or chronic, without hemorrhage or perforation: Secondary | ICD-10-CM | POA: Diagnosis not present

## 2014-08-23 DIAGNOSIS — E039 Hypothyroidism, unspecified: Secondary | ICD-10-CM | POA: Insufficient documentation

## 2014-08-23 DIAGNOSIS — K219 Gastro-esophageal reflux disease without esophagitis: Secondary | ICD-10-CM | POA: Insufficient documentation

## 2014-08-23 DIAGNOSIS — R1013 Epigastric pain: Secondary | ICD-10-CM

## 2014-08-23 DIAGNOSIS — K269 Duodenal ulcer, unspecified as acute or chronic, without hemorrhage or perforation: Secondary | ICD-10-CM

## 2014-08-23 DIAGNOSIS — K2981 Duodenitis with bleeding: Secondary | ICD-10-CM

## 2014-08-23 HISTORY — DX: Personal history of urinary calculi: Z87.442

## 2014-08-23 HISTORY — DX: Nausea with vomiting, unspecified: R11.2

## 2014-08-23 HISTORY — PX: ESOPHAGOGASTRODUODENOSCOPY (EGD) WITH PROPOFOL: SHX5813

## 2014-08-23 HISTORY — PX: BRAVO PH STUDY: SHX5421

## 2014-08-23 HISTORY — DX: Other specified postprocedural states: Z98.890

## 2014-08-23 SURGERY — PH MONITORING, ESOPHAGUS, WIRELESS
Anesthesia: Monitor Anesthesia Care

## 2014-08-23 MED ORDER — PROPOFOL 10 MG/ML IV BOLUS
INTRAVENOUS | Status: DC | PRN
  Administered 2014-08-23 (×2): 100 mg via INTRAVENOUS
  Administered 2014-08-23 (×2): 50 mg via INTRAVENOUS

## 2014-08-23 MED ORDER — DEXLANSOPRAZOLE 60 MG PO CPDR: 60.0000 mg | DELAYED_RELEASE_CAPSULE | Freq: Every day | ORAL | Status: DC

## 2014-08-23 MED ORDER — SODIUM CHLORIDE 0.9 % IV SOLN: INTRAVENOUS | Status: DC

## 2014-08-23 MED ORDER — LACTATED RINGERS IV SOLN
INTRAVENOUS | Status: DC
  Administered 2014-08-23: 1000 mL via INTRAVENOUS

## 2014-08-23 MED ORDER — PROPOFOL 10 MG/ML IV BOLUS
INTRAVENOUS | Status: AC
  Filled 2014-08-23: qty 20

## 2014-08-23 SURGICAL SUPPLY — 16 items
BLOCK BITE 60FR ADLT L/F BLUE (MISCELLANEOUS) ×2 IMPLANT
Bravo PH Capsule ×1 IMPLANT
ELECT REM PT RETURN 9FT ADLT (ELECTROSURGICAL)
ELECTRODE REM PT RTRN 9FT ADLT (ELECTROSURGICAL) IMPLANT
FORCEP RJ3 GP 1.8X160 W-NEEDLE (CUTTING FORCEPS) IMPLANT
FORCEPS BIOP RAD 4 LRG CAP 4 (CUTTING FORCEPS) IMPLANT
NDL SCLEROTHERAPY 25GX240 (NEEDLE) IMPLANT
NEEDLE SCLEROTHERAPY 25GX240 (NEEDLE) IMPLANT
PROBE APC STR FIRE (PROBE) IMPLANT
PROBE INJECTION GOLD (MISCELLANEOUS)
PROBE INJECTION GOLD 7FR (MISCELLANEOUS) IMPLANT
SNARE SHORT THROW 13M SML OVAL (MISCELLANEOUS) IMPLANT
SYR 50ML LL SCALE MARK (SYRINGE) IMPLANT
TUBING ENDO SMARTCAP PENTAX (MISCELLANEOUS) ×4 IMPLANT
TUBING IRRIGATION ENDOGATOR (MISCELLANEOUS) ×2 IMPLANT
WATER STERILE IRR 1000ML POUR (IV SOLUTION) IMPLANT

## 2014-08-23 NOTE — Transfer of Care (Signed)
Immediate Anesthesia Transfer of Care Note  Patient: Carol Chambers  Procedure(s) Performed: Procedure(s) (LRB): BRAVO PH STUDY (N/A) ESOPHAGOGASTRODUODENOSCOPY (EGD) WITH PROPOFOL (N/A)  Patient Location: PACU  Anesthesia Type: MAC  Level of Consciousness: sedated, patient cooperative and responds to stimulation  Airway & Oxygen Therapy: Patient Spontanous Breathing and Patient connected to face mask oxgen  Post-op Assessment: Report given to PACU RN and Post -op Vital signs reviewed and stable  Post vital signs: Reviewed and stable  Complications: No apparent anesthesia complications

## 2014-08-23 NOTE — H&P (Signed)
    History of Present Illness: [name] has returned for reevaluation of a chronic cough. She has a chronic nonproductive cough occurs throughout the day and night. She will awaken coughing. She's taking Robitussin without much improvement. She also complains of frequent postprandial belching. Food will come up into her chest. She denies pyrosis. He takes dexilant once daily. She denies dysphagia. In 2013 she underwent dilation of an early distal esophageal stricture     Review of Systems: Pertinent positive and negative review of systems were noted in the above HPI section. All other review of systems were otherwise negative.    Current Medications, Allergies, Past Medical History, Past Surgical History, Family History and Social History were reviewed in [place] record  Vital signs were reviewed in today's medical record. Physical Exam: General: Well developed , well nourished, no acute distress Skin: anicteric Head: Normocephalic and atraumatic Eyes: sclerae anicteric, EOMI Ears: Normal auditory acuity Mouth: No deformity or lesions Lungs: Clear throughout to auscultation Heart: Regular rate and rhythm; no murmurs, rubs or bruits Abdomen: Soft, non tender and non distended. No masses, hepatosplenomegaly or hernias noted. Normal Bowel sounds Rectal:deferred Musculoskeletal: Symmetrical with no gross deformities  Pulses: Normal pulses noted Extremities: No clubbing, cyanosis, edema or deformities noted Neurological: Alert oriented x 4, grossly nonfocal Psychological: Alert and cooperative. Normal mood and affect  See Assessment and Plan under Problem List               Cough - Carol Castle, MD at 07/13/2014 10:52 AM     Status: Written Related Problem: Cough   Expand All Collapse All   Chronic cough could be a cyclical cough. Chronic esophageal reflux is also a concern. If the latter, she has not responded to medical  therapy.  Recommendations #1 increase dexilant to every morning and daily at bedtime #2 gastric emptying scan #3 if she's not improved and scan is negative then I will obtain a 48 hour bravo pH study off medicines together with impedance plethysmography

## 2014-08-23 NOTE — Op Note (Addendum)
[facility] [place], [id number]   ENDOSCOPY PROCEDURE REPORT  PATIENT: Carol Chambers, Carol Chambers  MR#: [id number] BIRTHDATE: 1960/09/02 , 66  yrs. old GENDER: female ENDOSCOPIST: Inda Castle, MD REFERRED BY:  Donalynn Furlong, M.D. PROCEDURE DATE:  08/23/2014 PROCEDURE:  EGD w/ biopsy and EGD w/ Bravo capsule placement ASA CLASS:     Class II INDICATIONS:  chronic cough. MEDICATIONS: Monitored anesthesia care TOPICAL ANESTHETIC:  DESCRIPTION OF PROCEDURE: After the risks benefits and alternatives of the procedure were thoroughly explained, informed consent was obtained.  The [place] [id number] endoscope was introduced through the mouth and advanced to the second portion of the duodenum , Without limitations.  The instrument was slowly withdrawn as the mucosa was fully examined.    In the duodenal bulb there are at least 2 superficial 1-2 mm ulcers in several areas of erosions.  Biopsies were taken.   Except for the findings listed the EGD was otherwise normal.  Retroflexed views revealed no abnormalities.   C-line was at 41 cm  The scope was then withdrawn from the patient and the procedure completed. a bravo pH probe was placed at 37 cm from the incisors.  COMPLICATIONS: There were no immediate complications.  ENDOSCOPIC IMPRESSION: 1.   In the duodenal bulb there are at least 2 superficial 1-2 mm ulcers in several areas of erosions.  Biopsies were taken 2.   EGD was otherwise normal  RECOMMENDATIONS: Hold PPI therapy for 2 days Await biopsy results  REPEAT EXAM:  eSigned:  Inda Castle, MD 08/23/2014 4:41 PM Revised: 08/23/2014 4:41 PM   CC:

## 2014-08-23 NOTE — Discharge Instructions (Signed)
Esophagogastroduodenoscopy °Care After °Refer to this sheet in the next few weeks. These instructions provide you with information on caring for yourself after your procedure. Your caregiver may also give you more specific instructions. Your treatment has been planned according to current medical practices, but problems sometimes occur. Call your caregiver if you have any problems or questions after your procedure.  °HOME CARE INSTRUCTIONS °· Do not eat or drink anything until the numbing medicine (local anesthetic) has worn off and your gag reflex has returned. You will know that the local anesthetic has worn off when you can swallow comfortably. °· Do not drive for 12 hours after the procedure or as directed by your caregiver. °· Only take medicines as directed by your caregiver. °SEEK MEDICAL CARE IF:  °· You cannot stop coughing. °· You are not urinating at all or less than usual. °SEEK IMMEDIATE MEDICAL CARE IF: °· You have difficulty swallowing. °· You cannot eat or drink. °· You have worsening throat or chest pain. °· You have dizziness, lightheadedness, or you faint. °· You have nausea or vomiting. °· You have chills. °· You have a fever. °· You have severe abdominal pain. °· You have black, tarry, or bloody stools. °Document Released: 07/15/2012 Document Reviewed: 07/15/2012 °Exit[employer]® Patient Information ©2015 [employer]. This information is not intended to replace advice given to you by your health care provider. Make sure you discuss any questions you have with your health care provider. ° °

## 2014-08-23 NOTE — Anesthesia Preprocedure Evaluation (Signed)
Anesthesia Evaluation  Patient identified by MRN, date of birth, ID band Patient awake    Reviewed: Allergy & Precautions, H&P , NPO status , Patient's Chart, lab work & pertinent test results  History of Anesthesia Complications (+) PONV  Airway Mallampati: II  TM Distance: >3 FB Neck ROM: full    Dental no notable dental hx. (+) Teeth Intact, Dental Advisory Given   Pulmonary neg pulmonary ROS,  breath sounds clear to auscultation  Pulmonary exam normal       Cardiovascular Exercise Tolerance: Good negative cardio ROS  Rhythm:regular Rate:Normal     Neuro/Psych negative neurological ROS  negative psych ROS   GI/Hepatic negative GI ROS, Neg liver ROS, GERD-  Medicated and Controlled,  Endo/Other  negative endocrine ROSHypothyroidism   Renal/GU negative Renal ROS  negative genitourinary   Musculoskeletal   Abdominal   Peds  Hematology negative hematology ROS (+)   Anesthesia Other Findings   Reproductive/Obstetrics negative OB ROS                             Anesthesia Physical Anesthesia Plan  ASA: II  Anesthesia Plan: MAC   Post-op Pain Management:    Induction:   Airway Management Planned:   Additional Equipment:   Intra-op Plan:   Post-operative Plan:   Informed Consent: I have reviewed the patients History and Physical, chart, labs and discussed the procedure including the risks, benefits and alternatives for the proposed anesthesia with the patient or authorized representative who has indicated his/her understanding and acceptance.   Dental Advisory Given  Plan Discussed with: CRNA and Surgeon  Anesthesia Plan Comments:         Anesthesia Quick Evaluation

## 2014-08-23 NOTE — Anesthesia Postprocedure Evaluation (Signed)
  Anesthesia Post-op Note  Patient: Carol Chambers  Procedure(s) Performed: Procedure(s) (LRB): BRAVO PH STUDY (N/A) ESOPHAGOGASTRODUODENOSCOPY (EGD) WITH PROPOFOL (N/A)  Patient Location: PACU  Anesthesia Type: Mac  Level of Consciousness: awake and alert   Airway and Oxygen Therapy: Patient Spontanous Breathing  Post-op Pain: mild  Post-op Assessment: Post-op Vital signs reviewed, Patient's Cardiovascular Status Stable, Respiratory Function Stable, Patent Airway and No signs of Nausea or vomiting  Last Vitals:  Filed Vitals:   08/23/14 1710  BP: 112/64  Pulse: 81  Temp:   Resp: 11    Post-op Vital Signs: stable   Complications: No apparent anesthesia complications

## 2014-08-24 ENCOUNTER — Encounter (HOSPITAL_COMMUNITY): Payer: Self-pay | Admitting: Gastroenterology

## 2014-08-26 ENCOUNTER — Encounter: Payer: Self-pay | Admitting: Gastroenterology

## 2014-08-29 ENCOUNTER — Encounter: Payer: Self-pay | Admitting: Gastroenterology

## 2014-08-29 ENCOUNTER — Encounter: Payer: Self-pay | Admitting: Gynecology

## 2014-08-30 ENCOUNTER — Encounter: Payer: Self-pay | Admitting: Gastroenterology

## 2014-08-30 ENCOUNTER — Telehealth: Payer: Self-pay | Admitting: Gastroenterology

## 2014-08-30 NOTE — Telephone Encounter (Signed)
Advised patient the email was forwarded to Dr Deatra Ina

## 2014-08-31 ENCOUNTER — Encounter: Payer: Self-pay | Admitting: Gastroenterology

## 2014-08-31 NOTE — Telephone Encounter (Signed)
I am talking with patient about placing the probe again for the Bravo. She is trying to come up with a date she can do this again. It is looking like late Feb. To coordinate schedules. She has surgery on 2/18. She asking if this is the only option to diagnose and treat her for the chronic cough.

## 2014-08-31 NOTE — Telephone Encounter (Signed)
Think this is an important test to do

## 2014-09-01 ENCOUNTER — Other Ambulatory Visit: Payer: Self-pay

## 2014-09-01 ENCOUNTER — Other Ambulatory Visit: Payer: Self-pay | Admitting: Gynecology

## 2014-09-01 DIAGNOSIS — R053 Chronic cough: Secondary | ICD-10-CM

## 2014-09-01 DIAGNOSIS — R05 Cough: Secondary | ICD-10-CM

## 2014-09-01 NOTE — Telephone Encounter (Signed)
rescheduled

## 2014-09-05 ENCOUNTER — Encounter: Payer: Self-pay | Admitting: Gastroenterology

## 2014-09-08 ENCOUNTER — Other Ambulatory Visit: Payer: Self-pay | Admitting: Gastroenterology

## 2014-09-08 DIAGNOSIS — R0789 Other chest pain: Secondary | ICD-10-CM

## 2014-09-08 MED ORDER — HYOSCYAMINE SULFATE 0.125 MG SL SUBL: 0.2500 mg | SUBLINGUAL_TABLET | SUBLINGUAL | Status: DC | PRN

## 2014-09-08 NOTE — Telephone Encounter (Signed)
Please see mychart msg : pt wants to know if we are going to call this medication into her pharmacy.  She would like a call when it has been sent.

## 2014-09-12 ENCOUNTER — Encounter: Payer: BC Managed Care – PPO | Admitting: Gynecology

## 2014-09-22 ENCOUNTER — Other Ambulatory Visit (HOSPITAL_COMMUNITY)
Admission: RE | Admit: 2014-09-22 | Discharge: 2014-09-22 | Disposition: A | Discharge status: Home / Self Care | Payer: BLUE CROSS/BLUE SHIELD | Source: Ambulatory Visit | Attending: Gynecology | Admitting: Gynecology

## 2014-09-22 ENCOUNTER — Encounter: Payer: Self-pay | Admitting: Gynecology

## 2014-09-22 ENCOUNTER — Ambulatory Visit (INDEPENDENT_AMBULATORY_CARE_PROVIDER_SITE_OTHER): Payer: BLUE CROSS/BLUE SHIELD | Admitting: Gynecology

## 2014-09-22 VITALS — BP 120/76 | Ht 63.0 in | Wt 183.0 lb

## 2014-09-22 DIAGNOSIS — Z79890 Hormone replacement therapy (postmenopausal): Secondary | ICD-10-CM

## 2014-09-22 DIAGNOSIS — L918 Other hypertrophic disorders of the skin: Secondary | ICD-10-CM | POA: Diagnosis not present

## 2014-09-22 DIAGNOSIS — Z1151 Encounter for screening for human papillomavirus (HPV): Secondary | ICD-10-CM | POA: Insufficient documentation

## 2014-09-22 DIAGNOSIS — Z01419 Encounter for gynecological examination (general) (routine) without abnormal findings: Secondary | ICD-10-CM | POA: Diagnosis not present

## 2014-09-22 DIAGNOSIS — L989 Disorder of the skin and subcutaneous tissue, unspecified: Secondary | ICD-10-CM | POA: Diagnosis not present

## 2014-09-22 LAB — CBC WITH DIFFERENTIAL/PLATELET
BASOS PCT: 1 % (ref 0–1)
Basophils Absolute: 0.1 10*3/uL (ref 0.0–0.1)
Eosinophils Absolute: 0.1 10*3/uL (ref 0.0–0.7)
Eosinophils Relative: 2 % (ref 0–5)
HCT: 44.6 % (ref 36.0–46.0)
Hemoglobin: 14.9 g/dL (ref 12.0–15.0)
LYMPHS PCT: 31 % (ref 12–46)
Lymphs Abs: 2.1 10*3/uL (ref 0.7–4.0)
MCH: 30.9 pg (ref 26.0–34.0)
MCHC: 33.4 g/dL (ref 30.0–36.0)
MCV: 92.5 fL (ref 78.0–100.0)
MPV: 10.6 fL (ref 8.6–12.4)
Monocytes Absolute: 0.7 10*3/uL (ref 0.1–1.0)
Monocytes Relative: 11 % (ref 3–12)
Neutro Abs: 3.7 10*3/uL (ref 1.7–7.7)
Neutrophils Relative %: 55 % (ref 43–77)
PLATELETS: 282 10*3/uL (ref 150–400)
RBC: 4.82 MIL/uL (ref 3.87–5.11)
RDW: 13.2 % (ref 11.5–15.5)
WBC: 6.8 10*3/uL (ref 4.0–10.5)

## 2014-09-22 LAB — URINALYSIS W MICROSCOPIC + REFLEX CULTURE
Bilirubin Urine: NEGATIVE
Casts: NONE SEEN
Crystals: NONE SEEN
Glucose, UA: NEGATIVE mg/dL
HGB URINE DIPSTICK: NEGATIVE
Ketones, ur: NEGATIVE mg/dL
Leukocytes, UA: NEGATIVE
Nitrite: NEGATIVE
PH: 5.5 (ref 5.0–8.0)
Protein, ur: NEGATIVE mg/dL
RBC / HPF: NONE SEEN RBC/hpf (ref <3)
Specific Gravity, Urine: 1.019 (ref 1.005–1.030)
Urobilinogen, UA: 0.2 mg/dL (ref 0.0–1.0)
WBC, UA: NONE SEEN WBC/hpf (ref <3)

## 2014-09-22 LAB — COMPREHENSIVE METABOLIC PANEL
ALT: 13 U/L (ref 0–35)
AST: 16 U/L (ref 0–37)
Albumin: 4.2 g/dL (ref 3.5–5.2)
Alkaline Phosphatase: 58 U/L (ref 39–117)
BILIRUBIN TOTAL: 0.9 mg/dL (ref 0.2–1.2)
BUN: 17 mg/dL (ref 6–23)
CALCIUM: 9.6 mg/dL (ref 8.4–10.5)
CHLORIDE: 103 meq/L (ref 96–112)
CO2: 28 meq/L (ref 19–32)
Creat: 0.94 mg/dL (ref 0.50–1.10)
Glucose, Bld: 96 mg/dL (ref 70–99)
Potassium: 5.2 mEq/L (ref 3.5–5.3)
SODIUM: 139 meq/L (ref 135–145)
TOTAL PROTEIN: 6.9 g/dL (ref 6.0–8.3)

## 2014-09-22 LAB — LIPID PANEL
CHOLESTEROL: 209 mg/dL — AB (ref 0–200)
HDL: 69 mg/dL (ref >39)
LDL CALC: 111 mg/dL — AB (ref 0–99)
Total CHOL/HDL Ratio: 3 Ratio
Triglycerides: 143 mg/dL (ref <150)
VLDL: 29 mg/dL (ref 0–40)

## 2014-09-22 MED ORDER — PROGESTERONE MICRONIZED 100 MG PO CAPS: ORAL_CAPSULE | ORAL | Status: DC

## 2014-09-22 MED ORDER — DIAZEPAM 5 MG PO TABS: ORAL_TABLET | ORAL | Status: DC

## 2014-09-22 MED ORDER — ESTRADIOL 0.075 MG/24HR TD PTTW: 1.0000 | MEDICATED_PATCH | TRANSDERMAL | Status: DC

## 2014-09-22 NOTE — Patient Instructions (Signed)
Office will call you with biopsy results.  Call if any issues with the hormone replacement. Call if any vaginal bleeding.  You may obtain a copy of any labs that were done today by logging onto MyChart as outlined in the instructions provided with your AVS (after visit summary). The office will not call with normal lab results but certainly if there are any significant abnormalities then we will contact you.   Health Maintenance, Female A healthy lifestyle and preventative care can promote health and wellness.  Maintain regular health, dental, and eye exams.  Eat a healthy diet. Foods like vegetables, fruits, whole grains, low-fat dairy products, and lean protein foods contain the nutrients you need without too many calories. Decrease your intake of foods high in solid fats, added sugars, and salt. Get information about a proper diet from your caregiver, if necessary.  Regular physical exercise is one of the most important things you can do for your health. Most adults should get at least 150 minutes of moderate-intensity exercise (any activity that increases your heart rate and causes you to sweat) each week. In addition, most adults need muscle-strengthening exercises on 2 or more days a week.   Maintain a healthy weight. The body mass index (BMI) is a screening tool to identify possible weight problems. It provides an estimate of body fat based on height and weight. Your caregiver can help determine your BMI, and can help you achieve or maintain a healthy weight. For adults 20 years and older:  A BMI below 18.5 is considered underweight.  A BMI of 18.5 to 24.9 is normal.  A BMI of 25 to 29.9 is considered overweight.  A BMI of 30 and above is considered obese.  Maintain normal blood lipids and cholesterol by exercising and minimizing your intake of saturated fat. Eat a balanced diet with plenty of fruits and vegetables. Blood tests for lipids and cholesterol should begin at age 8 and be  repeated every 5 years. If your lipid or cholesterol levels are high, you are over 50, or you are a high risk for heart disease, you may need your cholesterol levels checked more frequently.Ongoing high lipid and cholesterol levels should be treated with medicines if diet and exercise are not effective.  If you smoke, find out from your caregiver how to quit. If you do not use tobacco, do not start.  Lung cancer screening is recommended for adults aged 73 80 years who are at high risk for developing lung cancer because of a history of smoking. Yearly low-dose computed tomography (CT) is recommended for people who have at least a 30-pack-year history of smoking and are a current smoker or have quit within the past 15 years. A pack year of smoking is smoking an average of 1 pack of cigarettes a day for 1 year (for example: 1 pack a day for 30 years or 2 packs a day for 15 years). Yearly screening should continue until the smoker has stopped smoking for at least 15 years. Yearly screening should also be stopped for people who develop a health problem that would prevent them from having lung cancer treatment.  If you are pregnant, do not drink alcohol. If you are breastfeeding, be very cautious about drinking alcohol. If you are not pregnant and choose to drink alcohol, do not exceed 1 drink per day. One drink is considered to be 12 ounces (355 mL) of beer, 5 ounces (148 mL) of wine, or 1.5 ounces (44 mL) of liquor.  Avoid use of street drugs. Do not share needles with anyone. Ask for help if you need support or instructions about stopping the use of drugs.  High blood pressure causes heart disease and increases the risk of stroke. Blood pressure should be checked at least every 1 to 2 years. Ongoing high blood pressure should be treated with medicines, if weight loss and exercise are not effective.  If you are 44 to 52 years old, ask your caregiver if you should take aspirin to prevent strokes.  Diabetes  screening involves taking a blood sample to check your fasting blood sugar level. This should be done once every 3 years, after age 54, if you are within normal weight and without risk factors for diabetes. Testing should be considered at a younger age or be carried out more frequently if you are overweight and have at least 1 risk factor for diabetes.  Breast cancer screening is essential preventative care for women. You should practice "breast self-awareness." This means understanding the normal appearance and feel of your breasts and may include breast self-examination. Any changes detected, no matter how small, should be reported to a caregiver. Women in their 31s and 30s should have a clinical breast exam (CBE) by a caregiver as part of a regular health exam every 1 to 3 years. After age 12, women should have a CBE every year. Starting at age 70, women should consider having a mammogram (breast X-ray) every year. Women who have a family history of breast cancer should talk to their caregiver about genetic screening. Women at a high risk of breast cancer should talk to their caregiver about having an MRI and a mammogram every year.  Breast cancer gene (BRCA)-related cancer risk assessment is recommended for women who have family members with BRCA-related cancers. BRCA-related cancers include breast, ovarian, tubal, and peritoneal cancers. Having family members with these cancers may be associated with an increased risk for harmful changes (mutations) in the breast cancer genes BRCA1 and BRCA2. Results of the assessment will determine the need for genetic counseling and BRCA1 and BRCA2 testing.  The Pap test is a screening test for cervical cancer. Women should have a Pap test starting at age 35. Between ages 63 and 55, Pap tests should be repeated every 2 years. Beginning at age 24, you should have a Pap test every 3 years as long as the past 3 Pap tests have been normal. If you had a hysterectomy for a  problem that was not cancer or a condition that could lead to cancer, then you no longer need Pap tests. If you are between ages 62 and 39, and you have had normal Pap tests going back 10 years, you no longer need Pap tests. If you have had past treatment for cervical cancer or a condition that could lead to cancer, you need Pap tests and screening for cancer for at least 20 years after your treatment. If Pap tests have been discontinued, risk factors (such as a new sexual partner) need to be reassessed to determine if screening should be resumed. Some women have medical problems that increase the chance of getting cervical cancer. In these cases, your caregiver may recommend more frequent screening and Pap tests.  The human papillomavirus (HPV) test is an additional test that may be used for cervical cancer screening. The HPV test looks for the virus that can cause the cell changes on the cervix. The cells collected during the Pap test can be tested for HPV. The  HPV test could be used to screen women aged 30 years and older, and should be used in women of any age who have unclear Pap test results. After the age of 63, women should have HPV testing at the same frequency as a Pap test.  Colorectal cancer can be detected and often prevented. Most routine colorectal cancer screening begins at the age of 54 and continues through age 62. However, your caregiver may recommend screening at an earlier age if you have risk factors for colon cancer. On a yearly basis, your caregiver may provide home test kits to check for hidden blood in the stool. Use of a small camera at the end of a tube, to directly examine the colon (sigmoidoscopy or colonoscopy), can detect the earliest forms of colorectal cancer. Talk to your caregiver about this at age 31, when routine screening begins. Direct examination of the colon should be repeated every 5 to 10 years through age 42, unless early forms of pre-cancerous polyps or small growths  are found.  Hepatitis C blood testing is recommended for all people born from [birth year] through 1965 and any individual with known risks for hepatitis C.  Practice safe sex. Use condoms and avoid high-risk sexual practices to reduce the spread of sexually transmitted infections (STIs). Sexually active women aged 4 and younger should be checked for Chlamydia, which is a common sexually transmitted infection. Older women with new or multiple partners should also be tested for Chlamydia. Testing for other STIs is recommended if you are sexually active and at increased risk.  Osteoporosis is a disease in which the bones lose minerals and strength with aging. This can result in serious bone fractures. The risk of osteoporosis can be identified using a bone density scan. Women ages 28 and over and women at risk for fractures or osteoporosis should discuss screening with their caregivers. Ask your caregiver whether you should be taking a calcium supplement or vitamin D to reduce the rate of osteoporosis.  Menopause can be associated with physical symptoms and risks. Hormone replacement therapy is available to decrease symptoms and risks. You should talk to your caregiver about whether hormone replacement therapy is right for you.  Use sunscreen. Apply sunscreen liberally and repeatedly throughout the day. You should seek shade when your shadow is shorter than you. Protect yourself by wearing long sleeves, pants, a wide-brimmed hat, and sunglasses year round, whenever you are outdoors.  Notify your caregiver of new moles or changes in moles, especially if there is a change in shape or color. Also notify your caregiver if a mole is larger than the size of a pencil eraser.  Stay current with your immunizations. Document Released: 02/11/2011 Document Revised: 11/23/2012 Document Reviewed: 02/11/2011 [facility] Patient Information 2014 [place].

## 2014-09-22 NOTE — Addendum Note (Signed)
Addended by: Nelva Nay on: 09/22/2014 09:24 AM   Modules accepted: Orders

## 2014-09-22 NOTE — Progress Notes (Signed)
Carol Chambers May 31, 1961 [id number]        54 y.o.  G1P0010 for annual exam.  Several issues noted below.  Past medical history,surgical history, problem list, medications, allergies, family history and social history were all reviewed and documented as reviewed in the EPIC chart.  ROS:  Performed with pertinent positives and negatives included in the history, assessment and plan.   Additional significant findings :  none   Exam: Kim Counsellor Vitals:   09/22/14 0841  BP: 120/76  Height: 5\' 3"  (1.6 m)  Weight: 183 lb (83.008 kg)   General appearance:  Normal affect, orientation and appearance. Skin: Grossly normal. Small skin tag/seborrheic keratoses junction of left breast and lower chest wall 12:00 position. Physical Exam  Pulmonary/Chest:      HEENT: Without gross lesions.  No cervical or supraclavicular adenopathy. Thyroid normal.  Lungs:  Clear without wheezing, rales or rhonchi Cardiac: RR, without RMG Abdominal:  Soft, nontender, without masses, guarding, rebound, organomegaly or hernia Breasts:  Examined lying and sitting without masses, retractions, discharge or axillary adenopathy. Pelvic:  Ext/BUS/vagina normal  Cervix normal. Pap/HPV  Uterus axial to anteverted, normal size, shape and contour, midline and mobile nontender   Adnexa  Without masses or tenderness    Anus and perineum  Normal   Rectovaginal  Normal sphincter tone without palpated masses or tenderness.   Procedure: Skin surrounding the skin tag was cleansed with Betadine, infiltrated with an percent lidocaine and the skin tag was excised in its entirety to the level of the surrounding skin. Several nitrate hemostasis applied. Sterile dressing applied.  Specimen sent to pathology.  Assessment/Plan:  54 y.o. G5P0010 female for annual exam.   1. Skin tag left anterior chest in the bra line. Bothersome to the patient. Patient desires to have removed which was done per above note. Patient will follow  up for pathology results.  Of note, review of last years annual exam I removed a seborrheic keratoses from her thigh but inadvertently did not include a procedure note. Pathology from that biopsy did show a benign seborrheic keratoses. 2. Postmenopausal/HRT. Patient started on minivelle 0.075 patches and Prometrium 100 mg nightly last year due to worsening menopausal symptoms. She has had great relief and is doing well and wants to continue. I again reviewed the risks to include stroke heart attack DVT and breast cancer. The issues of weaning/lowest dose for shortest period of time recommendations also discussed. At this point since she has just initiated it and doing well we will continue on this and refill 1 year provided. Patient knows to call if any vaginal bleeding. 3. Anxiety. Patient does have occasional anxiety for which she takes Valium 5 mg. #90 no refill provided to her [place]. 4. Mammography 12/2013. Continue with annual mammography this coming May. SBE monthly reviewed. 5. Pap smear 2013. Pap smear/HPV today.  No history of significant abnormal Pap smears. 6. Colonoscopy 2014. Repeat at their recommended interval. 7. DEXA never. Will plan further into the menopause noting HRT now protective. Check vitamin D level today. 8. Health maintenance. Baseline CBC, comprehensive metabolic panel, lipid profile, urinalysis, vitamin D, TSH ordered. Follow up in one year, sooner if any issues.     Anastasio Auerbach MD, 9:10 AM 09/22/2014

## 2014-09-23 ENCOUNTER — Other Ambulatory Visit: Payer: Self-pay | Admitting: Gynecology

## 2014-09-23 DIAGNOSIS — E559 Vitamin D deficiency, unspecified: Secondary | ICD-10-CM

## 2014-09-23 LAB — CYTOLOGY - PAP

## 2014-09-23 LAB — VITAMIN D 25 HYDROXY (VIT D DEFICIENCY, FRACTURES): VIT D 25 HYDROXY: 26 ng/mL — AB (ref 30–100)

## 2014-09-23 LAB — TSH: TSH: 1.696 u[IU]/mL (ref 0.350–4.500)

## 2014-09-30 ENCOUNTER — Encounter (HOSPITAL_COMMUNITY): Payer: Self-pay | Admitting: *Deleted

## 2014-10-07 ENCOUNTER — Encounter: Payer: Self-pay | Admitting: Gynecology

## 2014-10-07 ENCOUNTER — Other Ambulatory Visit: Payer: Self-pay | Admitting: Gynecology

## 2014-10-10 ENCOUNTER — Encounter (HOSPITAL_COMMUNITY)
Admission: RE | Disposition: A | Discharge status: Home / Self Care | Payer: Self-pay | Source: Ambulatory Visit | Attending: Gastroenterology

## 2014-10-10 ENCOUNTER — Ambulatory Visit (HOSPITAL_COMMUNITY)
Admission: RE | Admit: 2014-10-10 | Discharge: 2014-10-10 | Disposition: A | Discharge status: Home / Self Care | Payer: BLUE CROSS/BLUE SHIELD | Source: Ambulatory Visit | Attending: Gastroenterology | Admitting: Gastroenterology

## 2014-10-10 ENCOUNTER — Encounter (HOSPITAL_COMMUNITY): Payer: Self-pay | Admitting: *Deleted

## 2014-10-10 ENCOUNTER — Ambulatory Visit (HOSPITAL_COMMUNITY): Payer: BLUE CROSS/BLUE SHIELD | Admitting: Registered Nurse

## 2014-10-10 DIAGNOSIS — R112 Nausea with vomiting, unspecified: Secondary | ICD-10-CM | POA: Insufficient documentation

## 2014-10-10 DIAGNOSIS — E039 Hypothyroidism, unspecified: Secondary | ICD-10-CM | POA: Insufficient documentation

## 2014-10-10 DIAGNOSIS — R053 Chronic cough: Secondary | ICD-10-CM

## 2014-10-10 DIAGNOSIS — R05 Cough: Secondary | ICD-10-CM

## 2014-10-10 HISTORY — PX: BRAVO PH STUDY: SHX5421

## 2014-10-10 HISTORY — PX: ESOPHAGOGASTRODUODENOSCOPY (EGD) WITH PROPOFOL: SHX5813

## 2014-10-10 SURGERY — PH MONITORING, ESOPHAGUS, WIRELESS
Anesthesia: Monitor Anesthesia Care

## 2014-10-10 MED ORDER — LACTATED RINGERS IV SOLN
INTRAVENOUS | Status: DC | PRN
  Administered 2014-10-10: 10:00:00 via INTRAVENOUS

## 2014-10-10 MED ORDER — SODIUM CHLORIDE 0.9 % IV SOLN: INTRAVENOUS | Status: DC

## 2014-10-10 MED ORDER — LIDOCAINE HCL (CARDIAC) 20 MG/ML IV SOLN
INTRAVENOUS | Status: DC | PRN
  Administered 2014-10-10: 100 mg via INTRAVENOUS

## 2014-10-10 MED ORDER — LIDOCAINE HCL (CARDIAC) 20 MG/ML IV SOLN
INTRAVENOUS | Status: AC
  Filled 2014-10-10: qty 5

## 2014-10-10 MED ORDER — PROPOFOL 10 MG/ML IV BOLUS
INTRAVENOUS | Status: AC
  Filled 2014-10-10: qty 20

## 2014-10-10 MED ORDER — BUTAMBEN-TETRACAINE-BENZOCAINE 2-2-14 % EX AERO
INHALATION_SPRAY | CUTANEOUS | Status: DC | PRN
  Administered 2014-10-10: 2 via TOPICAL

## 2014-10-10 MED ORDER — LACTATED RINGERS IV SOLN: INTRAVENOUS | Status: DC

## 2014-10-10 MED ORDER — PROPOFOL INFUSION 10 MG/ML OPTIME
INTRAVENOUS | Status: DC | PRN
  Administered 2014-10-10: 140 ug/kg/min via INTRAVENOUS

## 2014-10-10 SURGICAL SUPPLY — 15 items

## 2014-10-10 NOTE — Anesthesia Procedure Notes (Signed)
Procedure Name: MAC Date/Time: 10/10/2014 11:12 AM Performed by: Carleene Cooper A Pre-anesthesia Checklist: Patient identified, Timeout performed, Emergency Drugs available, Patient being monitored and Suction available Patient Re-evaluated:Patient Re-evaluated prior to inductionOxygen Delivery Method: Nasal cannula Dental Injury: Teeth and Oropharynx as per pre-operative assessment

## 2014-10-10 NOTE — Discharge Instructions (Signed)
Hold dexilant for 48 hours

## 2014-10-10 NOTE — Anesthesia Postprocedure Evaluation (Signed)
  Anesthesia Post-op Note  Patient: Carol Chambers  Procedure(s) Performed: Procedure(s) with comments: BRAVO PH STUDY (N/A) - 2nd attempt ESOPHAGOGASTRODUODENOSCOPY (EGD) WITH PROPOFOL (N/A)  Patient Location: PACU  Anesthesia Type:MAC  Level of Consciousness: awake and alert   Airway and Oxygen Therapy: Patient Spontanous Breathing  Post-op Pain: none  Post-op Assessment: Post-op Vital signs reviewed  Post-op Vital Signs: Reviewed  Last Vitals:  Filed Vitals:   10/10/14 1155  BP:   Pulse: 70  Temp:   Resp: 17    Complications: No apparent anesthesia complications

## 2014-10-10 NOTE — Op Note (Signed)
[facility] [place], [id number]   ENDOSCOPY PROCEDURE REPORT  PATIENT: Carol Chambers, Carol Chambers  MR#: [id number] BIRTHDATE: 1961/06/29 , 51  yrs. old GENDER: female ENDOSCOPIST: Inda Castle, MD REFERRED BY: PROCEDURE DATE:  10/10/2014 PROCEDURE:  EGD, diagnostic  and placement of bravo pH probe ASA CLASS: INDICATIONS:  nausea and vomiting. MEDICATIONS: Monitored anesthesia care TOPICAL ANESTHETIC:  DESCRIPTION OF PROCEDURE: After the risks benefits and alternatives of the procedure were thoroughly explained, informed consent was obtained.  The Pentax Gastroscope [id number] endoscope was introduced through the mouth and advanced to the third portion of the duodenum , Without limitations.  The instrument was slowly withdrawn as the mucosa was fully examined.      EXAM: The esophagus and gastroesophageal junction were completely normal in appearance. the Z line was located at 41 cm from the incisors. The stomach was entered and closely examined.The antrum, angularis, and lesser curvature were well visualized, including a retroflexed view of the cardia and fundus.  The stomach wall was normally distensable.  The scope passed easily through the pylorus into the duodenum.  Retroflexed views revealed no abnormalities. a bravo pH probe was placed at 35 cm from the incisors  The scope was then withdrawn from the patient and the procedure completed.  COMPLICATIONS: There were no immediate complications.  ENDOSCOPIC IMPRESSION: Normal appearing esophagus and GE junction, the stomach was well visualized and normal in appearance, normal appearing duodenum  - status post bravo pH placement  RECOMMENDATIONS: hold dexilant for next 2 days  REPEAT EXAM:  eSigned:  Inda Castle, MD 10/10/2014 11:30 AM    CC:

## 2014-10-10 NOTE — H&P (Signed)
    History of Present Illness: [name] has returned for reevaluation of a chronic cough. She has a chronic nonproductive cough occurs throughout the day and night. She will awaken coughing. She's taking Robitussin without much improvement. She also complains of frequent postprandial belching. Food will come up into her chest. She denies pyrosis. He takes dexilant once daily. She denies dysphagia. 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  At endoscopy in January, 2016 superficial duodenal ulcers were seen.  A bravo pH probe was placed but fell off within an hour of placement.  Patient is here for repeat upper endoscopy for the purpose of placing a bravo pH probe.     Review of Systems: Pertinent positive and negative review of systems were noted in the above HPI section. All other review of systems were otherwise negative.    Current Medications, Allergies, Past Medical History, Past Surgical History, Family History and Social History were reviewed in [place] [facility] record  Vital signs were reviewed in today's medical record. Physical Exam: General: Well developed , well nourished, no acute distress Skin: anicteric Head: Normocephalic and atraumatic Eyes: sclerae anicteric, EOMI Ears: Normal auditory acuity Mouth: No deformity or lesions Lungs: Clear throughout to auscultation Heart: Regular rate and rhythm; no murmurs, rubs or bruits Abdomen: Soft, non tender and non distended. No masses, hepatosplenomegaly or hernias noted. Normal Bowel sounds Rectal:deferred Musculoskeletal: Symmetrical with no gross deformities  Pulses: Normal pulses noted Extremities: No clubbing, cyanosis, edema or deformities noted Neurological: Alert oriented x 4, grossly nonfocal Psychological: Alert and cooperative. Normal mood and affect  See Assessment and Plan under Problem List               Cough - Inda Castle, MD at  07/13/2014 10:52 AM     Status: Written Related Problem: Cough   Expand All Collapse All   Chronic cough could be a cyclical cough. Chronic esophageal reflux is also a concern. If the latter, she has not responded to medical therapy.  Recommendations  #1 48 hour bravo pH study off medicines

## 2014-10-10 NOTE — Transfer of Care (Signed)
Immediate Anesthesia Transfer of Care Note  Patient: Carol Chambers  Procedure(s) Performed: Procedure(s) with comments: BRAVO PH STUDY (N/A) - 2nd attempt ESOPHAGOGASTRODUODENOSCOPY (EGD) WITH PROPOFOL (N/A)  Patient Location: PACU and Endoscopy Unit  Anesthesia Type:MAC  Level of Consciousness: awake, alert , oriented and patient cooperative  Airway & Oxygen Therapy: Patient Spontanous Breathing and Patient connected to nasal cannula oxygen  Post-op Assessment: Report given to RN, Post -op Vital signs reviewed and stable and Patient moving all extremities  Post vital signs: Reviewed and stable  Last Vitals:  Filed Vitals:   10/10/14 0950  BP: 112/71  Pulse: 82  Temp: 36.7 C  Resp: 12    Complications: No apparent anesthesia complications

## 2014-10-10 NOTE — Anesthesia Preprocedure Evaluation (Addendum)
Anesthesia Evaluation  Patient identified by MRN, date of birth, ID band Patient awake    Reviewed: Allergy & Precautions, H&P , NPO status , Patient's Chart, lab work & pertinent test results  History of Anesthesia Complications (+) PONV  Airway Mallampati: II  TM Distance: >3 FB Neck ROM: full    Dental no notable dental hx. (+) Teeth Intact, Dental Advisory Given   Pulmonary neg pulmonary ROS,  breath sounds clear to auscultation  Pulmonary exam normal       Cardiovascular Exercise Tolerance: Good negative cardio ROS  Rhythm:regular Rate:Normal     Neuro/Psych negative neurological ROS  negative psych ROS   GI/Hepatic negative GI ROS, Neg liver ROS, GERD-  Medicated and Controlled,  Endo/Other  negative endocrine ROSHypothyroidism   Renal/GU negative Renal ROS  negative genitourinary   Musculoskeletal   Abdominal   Peds  Hematology negative hematology ROS (+)   Anesthesia Other Findings   Reproductive/Obstetrics negative OB ROS                            Anesthesia Physical  Anesthesia Plan  ASA: II  Anesthesia Plan: MAC   Post-op Pain Management:    Induction: Intravenous  Airway Management Planned: Natural Airway and Nasal Cannula  Additional Equipment:   Intra-op Plan:   Post-operative Plan:   Informed Consent: I have reviewed the patients History and Physical, chart, labs and discussed the procedure including the risks, benefits and alternatives for the proposed anesthesia with the patient or authorized representative who has indicated his/her understanding and acceptance.   Dental Advisory Given  Plan Discussed with: CRNA  Anesthesia Plan Comments:         Anesthesia Quick Evaluation

## 2014-10-12 ENCOUNTER — Encounter: Payer: Self-pay | Admitting: Gynecology

## 2014-10-12 ENCOUNTER — Encounter (HOSPITAL_COMMUNITY): Payer: Self-pay | Admitting: Gastroenterology

## 2014-10-14 ENCOUNTER — Encounter: Payer: Self-pay | Admitting: Gastroenterology

## 2014-10-18 NOTE — Procedures (Signed)
[facility]                               PROCEDURE NOTE  NAME:STOVALLGeana, Walts                       MRN:          [id number] DATE:10/10/2014                            DOB:          1961/02/25   This is a Bravo 48-hour pH study performed on February 29, 2015.  The patient underwent the examination while holding PPI therapy.  In the first 24 hours, the percentage of time in reflux was 9.8%.  The longest reflux episode was 10 seconds.  Total DeMeester score was 31.1.  On day 2, percentage of time in reflux was 4.8.  Total DeMeester score was 13 with normal less than or equal to 14.72.  Symptom index for reflux was 31.8 and sensitivity 29.2.  Symptom associated probability was 100.  IMPRESSION:  Symptomatic gastroesophageal reflux while off proton pump inhibitor therapy.  The patient did not complain of cough during the study.  RECOMMENDATIONS:  Continue high-dose PPI therapy.    Sandy Salaam. Deatra Ina, MD,FACG    RDK/MedQ  DD: 10/17/2014  DT: 10/18/2014  Job #: [id number]

## 2014-10-19 ENCOUNTER — Ambulatory Visit (INDEPENDENT_AMBULATORY_CARE_PROVIDER_SITE_OTHER): Payer: BLUE CROSS/BLUE SHIELD | Admitting: Gastroenterology

## 2014-10-19 ENCOUNTER — Encounter: Payer: Self-pay | Admitting: Gastroenterology

## 2014-10-19 VITALS — BP 124/72 | HR 68 | Ht 62.5 in | Wt 189.0 lb

## 2014-10-19 DIAGNOSIS — K219 Gastro-esophageal reflux disease without esophagitis: Secondary | ICD-10-CM

## 2014-10-19 DIAGNOSIS — R059 Cough, unspecified: Secondary | ICD-10-CM

## 2014-10-19 DIAGNOSIS — R05 Cough: Secondary | ICD-10-CM

## 2014-10-19 NOTE — Progress Notes (Signed)
      History of Present Illness:  Ms. Brayton El went 48 hour bravo pH testing while holding medications.  She has significant reflux on day one with total DeMeester score of 31.1.  On day 2 DeMeester score was 13.  Interestingly, during these 2 days she had no coughing.  She claims that her cough has primarily subsided.    Review of Systems: Pertinent positive and negative review of systems were noted in the above HPI section. All other review of systems were otherwise negative.    Current Medications, Allergies, Past Medical History, Past Surgical History, Family History and Social History were reviewed in [facility] record  Vital signs were reviewed in today's medical record. Physical Exam: General: Well developed , well nourished, no acute distress   See Assessment and Plan under Problem List

## 2014-10-19 NOTE — Assessment & Plan Note (Signed)
Patient does have acid reflux, it is well controlled with dexilant.  No coughing during the 2 days of the probable pH study.  On this basis I think it is unlikely that coughing is related to reflux.  Plan to continue dexilant

## 2014-10-19 NOTE — Assessment & Plan Note (Signed)
Coughing has subsided.  The absence of cough during documented episodes of reflux mitigated against reflux as a cause for cough.

## 2014-10-19 NOTE — Patient Instructions (Signed)
Follow up as needed

## 2014-10-20 ENCOUNTER — Other Ambulatory Visit: Payer: Self-pay | Admitting: *Deleted

## 2014-10-20 ENCOUNTER — Encounter: Payer: Self-pay | Admitting: Gastroenterology

## 2014-10-20 MED ORDER — DEXLANSOPRAZOLE 60 MG PO CPDR: 60.0000 mg | DELAYED_RELEASE_CAPSULE | Freq: Every day | ORAL | Status: DC

## 2014-10-20 MED ORDER — DEXLANSOPRAZOLE 60 MG PO CPDR: DELAYED_RELEASE_CAPSULE | ORAL | Status: DC

## 2014-10-21 ENCOUNTER — Telehealth: Payer: Self-pay | Admitting: Gastroenterology

## 2014-10-21 NOTE — Telephone Encounter (Signed)
Called pharmacy back. They was making sure that the new dexilant rx was correct

## 2014-12-06 ENCOUNTER — Other Ambulatory Visit: Payer: Self-pay | Admitting: *Deleted

## 2014-12-06 ENCOUNTER — Encounter: Payer: Self-pay | Admitting: Gynecology

## 2014-12-06 MED ORDER — ESTRADIOL 0.075 MG/24HR TD PTTW: 1.0000 | MEDICATED_PATCH | TRANSDERMAL | Status: DC

## 2014-12-06 NOTE — Telephone Encounter (Signed)
I am okay with the substitution of the patch by the pharmacy.

## 2014-12-06 NOTE — Telephone Encounter (Signed)
minvelle patch cost is too expensive, per TF okay for pt to have vivelle dot patch with refills

## 2014-12-13 ENCOUNTER — Other Ambulatory Visit: Payer: Self-pay | Admitting: *Deleted

## 2014-12-13 DIAGNOSIS — E559 Vitamin D deficiency, unspecified: Secondary | ICD-10-CM

## 2014-12-19 ENCOUNTER — Encounter: Payer: Self-pay | Admitting: Gynecology

## 2014-12-19 NOTE — Telephone Encounter (Signed)
It is fine to do at work

## 2015-01-03 ENCOUNTER — Other Ambulatory Visit: Payer: Self-pay

## 2015-01-03 ENCOUNTER — Encounter: Payer: Self-pay | Admitting: Gastroenterology

## 2015-01-03 MED ORDER — DEXLANSOPRAZOLE 60 MG PO CPDR: DELAYED_RELEASE_CAPSULE | ORAL | Status: DC

## 2015-01-06 ENCOUNTER — Other Ambulatory Visit: Payer: BLUE CROSS/BLUE SHIELD

## 2015-01-10 ENCOUNTER — Encounter: Payer: Self-pay | Admitting: Gynecology

## 2015-01-13 ENCOUNTER — Encounter: Payer: Self-pay | Admitting: Gynecology

## 2015-01-17 ENCOUNTER — Encounter: Payer: Self-pay | Admitting: Gynecology

## 2015-04-23 ENCOUNTER — Encounter: Payer: Self-pay | Admitting: Gynecology

## 2015-04-25 ENCOUNTER — Encounter: Payer: Self-pay | Admitting: Gynecology

## 2015-04-25 ENCOUNTER — Ambulatory Visit (INDEPENDENT_AMBULATORY_CARE_PROVIDER_SITE_OTHER): Payer: BLUE CROSS/BLUE SHIELD | Admitting: Gynecology

## 2015-04-25 VITALS — BP 112/70

## 2015-04-25 DIAGNOSIS — N950 Postmenopausal bleeding: Secondary | ICD-10-CM | POA: Diagnosis not present

## 2015-04-25 NOTE — Progress Notes (Signed)
Carol Chambers 22-Oct-1960 [id number]        54 y.o.  G1P0010 Presents with episode of postmenopausal bleeding starting 3 days ago and lasting for 2 days. Had a little bit of cramping associated with it but no prodromal/moliminal symptoms. Last menses reported 3 years ago.  Is on HRT to consist of Vivelle dot 0.075 patches and Prometrium 100 mg daily. Denies having missed any medication.  No urinary symptoms or GI symptoms.  Past medical history,surgical history, problem list, medications, allergies, family history and social history were all reviewed and documented in the EPIC chart.  Directed ROS with pertinent positives and negatives documented in the history of present illness/assessment and plan.  Exam: Kim assistant Filed Vitals:   04/25/15 1359  BP: 112/70   General appearance:  Normal Spine straight without CVA tenderness Abdomen soft nontender without masses guarding rebound Pelvic external BUS vagina normal with atrophic changes. Cervix normal with no bleeding noted. Uterus is normal size midline mobile nontender. Adnexa without masses or tenderness.  Assessment/Plan:  54 y.o. G1P0010 with episode of postmenopausal bleeding now resolved. On HRT. Discussed differential to include abarrant ovulation, atrophic bleeding, polyps, hyperplasia/uterine cancer. Schedule sonohysterogram for endometrial assessment. Patient agrees to follow up for this.    Anastasio Auerbach MD, 2:11 PM 04/25/2015

## 2015-04-25 NOTE — Patient Instructions (Signed)
Follow-up for the ultrasound as scheduled. 

## 2015-04-27 ENCOUNTER — Other Ambulatory Visit: Payer: Self-pay | Admitting: Gynecology

## 2015-04-27 DIAGNOSIS — N950 Postmenopausal bleeding: Secondary | ICD-10-CM

## 2015-05-03 ENCOUNTER — Telehealth: Payer: Self-pay | Admitting: Gynecology

## 2015-05-03 NOTE — Telephone Encounter (Signed)
05/03/15-I LM VM cell for pt that her BC ins covers her sonohysterogram under her $15 copay. BC [phone]

## 2015-05-11 ENCOUNTER — Other Ambulatory Visit: Payer: Self-pay | Admitting: Gynecology

## 2015-05-11 ENCOUNTER — Ambulatory Visit (INDEPENDENT_AMBULATORY_CARE_PROVIDER_SITE_OTHER): Payer: BLUE CROSS/BLUE SHIELD

## 2015-05-11 ENCOUNTER — Ambulatory Visit (INDEPENDENT_AMBULATORY_CARE_PROVIDER_SITE_OTHER): Payer: BLUE CROSS/BLUE SHIELD | Admitting: Gynecology

## 2015-05-11 ENCOUNTER — Encounter: Payer: Self-pay | Admitting: Gynecology

## 2015-05-11 DIAGNOSIS — N83339 Acquired atrophy of ovary and fallopian tube, unspecified side: Secondary | ICD-10-CM

## 2015-05-11 DIAGNOSIS — D251 Intramural leiomyoma of uterus: Secondary | ICD-10-CM

## 2015-05-11 DIAGNOSIS — N950 Postmenopausal bleeding: Secondary | ICD-10-CM

## 2015-05-11 DIAGNOSIS — N852 Hypertrophy of uterus: Secondary | ICD-10-CM | POA: Diagnosis not present

## 2015-05-11 DIAGNOSIS — N8333 Acquired atrophy of ovary and fallopian tube: Secondary | ICD-10-CM

## 2015-05-11 DIAGNOSIS — Q51810 Arcuate uterus: Secondary | ICD-10-CM

## 2015-05-11 NOTE — Patient Instructions (Signed)
Office will call you with biopsy results. Call if you do any further bleeding.

## 2015-05-11 NOTE — Progress Notes (Addendum)
Carol Chambers [birth year] [id number]        54 y.o.  G1P0010 presents for sonohysterogram due to an episode of postmenopausal bleeding after 3 years of no menses.  Past medical history,surgical history, problem list, medications, allergies, family history and social history were all reviewed and documented in the EPIC chart.  Directed ROS with pertinent positives and negatives documented in the history of present illness/assessment and plan.  Exam: [place] assistant There were no vitals filed for this visit. General appearance:  Normal External BUS vagina normal. Cervix normal. Uterus grossly normal size retroverted. Adnexa without masses or tenderness.  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 Endometrial echo of 3.8 mm.  Right and left ovaries atrophic in appearance. Cul-de-sac negative.  Sonohysterogram performed, sterile technique, easy catheter introduction, good distention with no abnormalities. Endometrial sample taken. Patient tolerated well.  Assessment/Plan:  54 y.o. G1P0010 with episode of postmenopausal bleeding. Endometrium appears thin. Will follow up for biopsy results. Assuming negative them will monitor for now And patient nose to report any further bleeding.    Anastasio Auerbach MD, 3:35 PM 05/11/2015

## 2015-05-14 ENCOUNTER — Encounter: Payer: Self-pay | Admitting: Gynecology

## 2015-05-15 MED ORDER — MEGESTROL ACETATE 40 MG PO TABS: 40.0000 mg | ORAL_TABLET | Freq: Two times a day (BID) | ORAL | Status: DC

## 2015-06-04 ENCOUNTER — Encounter: Payer: Self-pay | Admitting: Gynecology

## 2015-06-05 NOTE — Telephone Encounter (Signed)
I would stay on the same dose of hormones and see what next month brings. Call if continued bleeding.

## 2015-06-22 ENCOUNTER — Encounter: Payer: Self-pay | Admitting: Gynecology

## 2015-06-23 NOTE — Telephone Encounter (Signed)
I would recommend increasing her minivelle patch to the 0.1 mg patch. Continue the Prometrium 100 mg nightly and see if the little extra estrogen doesn't stabilize the lining and stop the bleeding.

## 2015-06-26 NOTE — Telephone Encounter (Signed)
She certainly can try stopping the patch and progesterone to see what happens and how she feels. Again the alternative would be to increase the strength of the estrogen patch as previously outlined. Either choice is okay by me

## 2015-09-25 ENCOUNTER — Encounter: Payer: BLUE CROSS/BLUE SHIELD | Admitting: Gynecology

## 2015-09-29 ENCOUNTER — Ambulatory Visit (INDEPENDENT_AMBULATORY_CARE_PROVIDER_SITE_OTHER): Payer: BLUE CROSS/BLUE SHIELD | Admitting: Gynecology

## 2015-09-29 ENCOUNTER — Encounter: Payer: Self-pay | Admitting: Gynecology

## 2015-09-29 VITALS — BP 124/70 | Ht 62.0 in | Wt 174.0 lb

## 2015-09-29 DIAGNOSIS — Z01419 Encounter for gynecological examination (general) (routine) without abnormal findings: Secondary | ICD-10-CM | POA: Diagnosis not present

## 2015-09-29 DIAGNOSIS — Z1321 Encounter for screening for nutritional disorder: Secondary | ICD-10-CM

## 2015-09-29 DIAGNOSIS — F411 Generalized anxiety disorder: Secondary | ICD-10-CM | POA: Diagnosis not present

## 2015-09-29 DIAGNOSIS — E038 Other specified hypothyroidism: Secondary | ICD-10-CM

## 2015-09-29 DIAGNOSIS — Z13220 Encounter for screening for lipoid disorders: Secondary | ICD-10-CM | POA: Diagnosis not present

## 2015-09-29 LAB — LIPID PANEL
CHOLESTEROL: 214 mg/dL — AB (ref 125–200)
HDL: 92 mg/dL (ref >=46)
LDL Cholesterol: 107 mg/dL (ref <130)
Total CHOL/HDL Ratio: 2.3 Ratio (ref <=5.0)
Triglycerides: 77 mg/dL (ref <150)
VLDL: 15 mg/dL (ref <30)

## 2015-09-29 LAB — CBC WITH DIFFERENTIAL/PLATELET
Basophils Absolute: 0 10*3/uL (ref 0.0–0.1)
Basophils Relative: 0 % (ref 0–1)
Eosinophils Absolute: 0.2 10*3/uL (ref 0.0–0.7)
Eosinophils Relative: 2 % (ref 0–5)
HCT: 43.6 % (ref 36.0–46.0)
HEMOGLOBIN: 14.8 g/dL (ref 12.0–15.0)
LYMPHS PCT: 33 % (ref 12–46)
Lymphs Abs: 2.5 10*3/uL (ref 0.7–4.0)
MCH: 30.5 pg (ref 26.0–34.0)
MCHC: 33.9 g/dL (ref 30.0–36.0)
MCV: 89.9 fL (ref 78.0–100.0)
MONOS PCT: 10 % (ref 3–12)
MPV: 11.2 fL (ref 8.6–12.4)
Monocytes Absolute: 0.8 10*3/uL (ref 0.1–1.0)
NEUTROS ABS: 4.1 10*3/uL (ref 1.7–7.7)
NEUTROS PCT: 55 % (ref 43–77)
Platelets: 237 10*3/uL (ref 150–400)
RBC: 4.85 MIL/uL (ref 3.87–5.11)
RDW: 13.2 % (ref 11.5–15.5)
WBC: 7.5 10*3/uL (ref 4.0–10.5)

## 2015-09-29 LAB — COMPREHENSIVE METABOLIC PANEL
ALBUMIN: 4.5 g/dL (ref 3.6–5.1)
ALT: 12 U/L (ref 6–29)
AST: 14 U/L (ref 10–35)
Alkaline Phosphatase: 77 U/L (ref 33–130)
BUN: 13 mg/dL (ref 7–25)
CO2: 25 mmol/L (ref 20–31)
CREATININE: 0.86 mg/dL (ref 0.50–1.05)
Calcium: 9.7 mg/dL (ref 8.6–10.4)
Chloride: 103 mmol/L (ref 98–110)
Glucose, Bld: 94 mg/dL (ref 65–99)
Potassium: 4.3 mmol/L (ref 3.5–5.3)
SODIUM: 139 mmol/L (ref 135–146)
TOTAL PROTEIN: 6.9 g/dL (ref 6.1–8.1)
Total Bilirubin: 0.9 mg/dL (ref 0.2–1.2)

## 2015-09-29 LAB — TSH: TSH: 0.4 mIU/L

## 2015-09-29 MED ORDER — FLUOXETINE HCL 10 MG PO CAPS: 10.0000 mg | ORAL_CAPSULE | Freq: Every day | ORAL | Status: DC

## 2015-09-29 NOTE — Progress Notes (Signed)
DEYANI SPAINHOWER [birth year] [id number]        55 y.o.  G1P0010  for annual exam.  Doing well. Several issues noted below.  Past medical history,surgical history, problem list, medications, allergies, family history and social history were all reviewed and documented as reviewed in the EPIC chart.  ROS:  Performed with pertinent positives and negatives included in the history, assessment and plan.   Additional significant findings :  none   Exam: [name] assistant Filed Vitals:   09/29/15 0837  BP: 124/70  Height: 5\' 2"  (1.575 m)  Weight: 174 lb (78.926 kg)   General appearance:  Normal affect, orientation and appearance. Skin: Grossly normal HEENT: Without gross lesions.  No cervical or supraclavicular adenopathy. Thyroid normal.  Lungs:  Clear without wheezing, rales or rhonchi Cardiac: RR, without RMG Abdominal:  Soft, nontender, without masses, guarding, rebound, organomegaly or hernia Breasts:  Examined lying and sitting without masses, retractions, discharge or axillary adenopathy. Pelvic:  Ext/BUS/vagina normal  Cervix normal  Uterus anteverted, normal size, shape and contour, midline and mobile nontender   Adnexa without masses or tenderness    Anus and perineum normal   Rectovaginal normal sphincter tone without palpated masses or tenderness.    Assessment/Plan:  55 y.o. G25P0010 female for annual exam.   1. Postmenopausal. Has been on HRT with workup for postmenopausal bleeding this past fall. She decided to stop her HRT and has done well with this with no further bleeding. Does note some anxiety with short temper as the only thing she's noticed since stopping HRT. Options for management reviewed and ultimately will going to try a low-dose fluoxetine 10 mg daily. She has been using Valium in the past states she really isn't using it anymore and has a whole bottle at home. Side effects and risks of fluoxetine reviewed up to including suicide ideation. Patient will call me  after a month for dosage adjustment if she does not feel she is getting an adequate response. 2. Pap smear/HPV 09/2014 negative. No Pap smear done today.  No history of abnormal Pap smears. 3. Mammography 12/2014. Continue with annual mammography when due. SBE monthly 4. Colonoscopy 2014. Repeat at their recommended interval. 5. DEXA never. Plan further into menopause. 6. Health maintenance. Patient requests baseline labs that she forward to her primary physician who is following her for hypothyroidism and hypercholesterolemia. CBC, comprehensive metabolic panel, lipid profile, urinalysis, vitamin D and TSH ordered. Follow up in response to the fluoxetine otherwise 1 year for annual exam   Anastasio Auerbach MD, 9:03 AM 09/29/2015

## 2015-09-29 NOTE — Patient Instructions (Signed)
Start on the fluoxetine 10 mg tablet daily. If you do not feel it's helping with the anxiety in 1 month call me for dose adjustment. If you're otherwise doing well then stay on this through next year when I see you.  You may obtain a copy of any labs that were done today by logging onto MyChart as outlined in the instructions provided with your AVS (after visit summary). The office will not call with normal lab results but certainly if there are any significant abnormalities then we will contact you.   Health Maintenance Adopting a healthy lifestyle and getting preventive care can go a long way to promote health and wellness. Talk with your health care provider about what schedule of regular examinations is right for you. This is a good chance for you to check in with your provider about disease prevention and staying healthy. In between checkups, there are plenty of things you can do on your own. Experts have done a lot of research about which lifestyle changes and preventive measures are most likely to keep you healthy. Ask your health care provider for more information. WEIGHT AND DIET  Eat a healthy diet  Be sure to include plenty of vegetables, fruits, low-fat dairy products, and lean protein.  Do not eat a lot of foods high in solid fats, added sugars, or salt.  Get regular exercise. This is one of the most important things you can do for your health.  Most adults should exercise for at least 150 minutes each week. The exercise should increase your heart rate and make you sweat (moderate-intensity exercise).  Most adults should also do strengthening exercises at least twice a week. This is in addition to the moderate-intensity exercise.  Maintain a healthy weight  Body mass index (BMI) is a measurement that can be used to identify possible weight problems. It estimates body fat based on height and weight. Your health care provider can help determine your BMI and help you achieve or  maintain a healthy weight.  For females 54 years of age and older:   A BMI below 18.5 is considered underweight.  A BMI of 18.5 to 24.9 is normal.  A BMI of 25 to 29.9 is considered overweight.  A BMI of 30 and above is considered obese.  Watch levels of cholesterol and blood lipids  You should start having your blood tested for lipids and cholesterol at 53 years of age, then have this test every 5 years.  You may need to have your cholesterol levels checked more often if:  Your lipid or cholesterol levels are high.  You are older than 53 years of age.  You are at high risk for heart disease.  CANCER SCREENING   Lung Cancer  Lung cancer screening is recommended for adults 50-7 years old who are at high risk for lung cancer because of a history of smoking.  A yearly low-dose CT scan of the lungs is recommended for people who:  Currently smoke.  Have quit within the past 15 years.  Have at least a 30-pack-year history of smoking. A pack year is smoking an average of one pack of cigarettes a day for 1 year.  Yearly screening should continue until it has been 15 years since you quit.  Yearly screening should stop if you develop a health problem that would prevent you from having lung cancer treatment.  Breast Cancer  Practice breast self-awareness. This means understanding how your breasts normally appear and feel.  It  also means doing regular breast self-exams. Let your health care provider know about any changes, no matter how small.  If you are in your 20s or 30s, you should have a clinical breast exam (CBE) by a health care provider every 1-3 years as part of a regular health exam.  If you are 92 or older, have a CBE every year. Also consider having a breast X-ray (mammogram) every year.  If you have a family history of breast cancer, talk to your health care provider about genetic screening.  If you are at high risk for breast cancer, talk to your health care  provider about having an MRI and a mammogram every year.  Breast cancer gene (BRCA) assessment is recommended for women who have family members with BRCA-related cancers. BRCA-related cancers include:  Breast.  Ovarian.  Tubal.  Peritoneal cancers.  Results of the assessment will determine the need for genetic counseling and BRCA1 and BRCA2 testing. Cervical Cancer Routine pelvic examinations to screen for cervical cancer are no longer recommended for nonpregnant women who are considered low risk for cancer of the pelvic organs (ovaries, uterus, and vagina) and who do not have symptoms. A pelvic examination may be necessary if you have symptoms including those associated with pelvic infections. Ask your health care provider if a screening pelvic exam is right for you.   The Pap test is the screening test for cervical cancer for women who are considered at risk.  If you had a hysterectomy for a problem that was not cancer or a condition that could lead to cancer, then you no longer need Pap tests.  If you are older than 65 years, and you have had normal Pap tests for the past 10 years, you no longer need to have Pap tests.  If you have had past treatment for cervical cancer or a condition that could lead to cancer, you need Pap tests and screening for cancer for at least 20 years after your treatment.  If you no longer get a Pap test, assess your risk factors if they change (such as having a new sexual partner). This can affect whether you should start being screened again.  Some women have medical problems that increase their chance of getting cervical cancer. If this is the case for you, your health care provider may recommend more frequent screening and Pap tests.  The human papillomavirus (HPV) test is another test that may be used for cervical cancer screening. The HPV test looks for the virus that can cause cell changes in the cervix. The cells collected during the Pap test can be  tested for HPV.  The HPV test can be used to screen women 68 years of age and older. Getting tested for HPV can extend the interval between normal Pap tests from three to five years.  An HPV test also should be used to screen women of any age who have unclear Pap test results.  After 53 years of age, women should have HPV testing as often as Pap tests.  Colorectal Cancer  This type of cancer can be detected and often prevented.  Routine colorectal cancer screening usually begins at 53 years of age and continues through 53 years of age.  Your health care provider may recommend screening at an earlier age if you have risk factors for colon cancer.  Your health care provider may also recommend using home test kits to check for hidden blood in the stool.  A small camera at the end  of a tube can be used to examine your colon directly (sigmoidoscopy or colonoscopy). This is done to check for the earliest forms of colorectal cancer.  Routine screening usually begins at age 76.  Direct examination of the colon should be repeated every 5-10 years through 53 years of age. However, you may need to be screened more often if early forms of precancerous polyps or small growths are found. Skin Cancer  Check your skin from head to toe regularly.  Tell your health care provider about any new moles or changes in moles, especially if there is a change in a mole's shape or color.  Also tell your health care provider if you have a mole that is larger than the size of a pencil eraser.  Always use sunscreen. Apply sunscreen liberally and repeatedly throughout the day.  Protect yourself by wearing long sleeves, pants, a wide-brimmed hat, and sunglasses whenever you are outside. HEART DISEASE, DIABETES, AND HIGH BLOOD PRESSURE   Have your blood pressure checked at least every 1-2 years. High blood pressure causes heart disease and increases the risk of stroke.  If you are between 84 years and 57 years  old, ask your health care provider if you should take aspirin to prevent strokes.  Have regular diabetes screenings. This involves taking a blood sample to check your fasting blood sugar level.  If you are at a normal weight and have a low risk for diabetes, have this test once every three years after 53 years of age.  If you are overweight and have a high risk for diabetes, consider being tested at a younger age or more often. PREVENTING INFECTION  Hepatitis B  If you have a higher risk for hepatitis B, you should be screened for this virus. You are considered at high risk for hepatitis B if:  You were born in a country where hepatitis B is common. Ask your health care provider which countries are considered high risk.  Your parents were born in a high-risk country, and you have not been immunized against hepatitis B (hepatitis B vaccine).  You have HIV or AIDS.  You use needles to inject street drugs.  You live with someone who has hepatitis B.  You have had sex with someone who has hepatitis B.  You get hemodialysis treatment.  You take certain medicines for conditions, including cancer, organ transplantation, and autoimmune conditions. Hepatitis C  Blood testing is recommended for:  Everyone born from [birth year] through 1963.  Anyone with known risk factors for hepatitis C. Sexually transmitted infections (STIs)  You should be screened for sexually transmitted infections (STIs) including gonorrhea and chlamydia if:  You are sexually active and are younger than 53 years of age.  You are older than 53 years of age and your health care provider tells you that you are at risk for this type of infection.  Your sexual activity has changed since you were last screened and you are at an increased risk for chlamydia or gonorrhea. Ask your health care provider if you are at risk.  If you do not have HIV, but are at risk, it may be recommended that you take a prescription medicine  daily to prevent HIV infection. This is called pre-exposure prophylaxis (PrEP). You are considered at risk if:  You are sexually active and do not regularly use condoms or know the HIV status of your partner(s).  You take drugs by injection.  You are sexually active with a partner who has HIV.  Talk with your health care provider about whether you are at high risk of being infected with HIV. If you choose to begin PrEP, you should first be tested for HIV. You should then be tested every 3 months for as long as you are taking PrEP.  PREGNANCY   If you are premenopausal and you may become pregnant, ask your health care provider about preconception counseling.  If you may become pregnant, take 400 to 800 micrograms (mcg) of folic acid every day.  If you want to prevent pregnancy, talk to your health care provider about birth control (contraception). OSTEOPOROSIS AND MENOPAUSE   Osteoporosis is a disease in which the bones lose minerals and strength with aging. This can result in serious bone fractures. Your risk for osteoporosis can be identified using a bone density scan.  If you are 41 years of age or older, or if you are at risk for osteoporosis and fractures, ask your health care provider if you should be screened.  Ask your health care provider whether you should take a calcium or vitamin D supplement to lower your risk for osteoporosis.  Menopause may have certain physical symptoms and risks.  Hormone replacement therapy may reduce some of these symptoms and risks. Talk to your health care provider about whether hormone replacement therapy is right for you.  HOME CARE INSTRUCTIONS   Schedule regular health, dental, and eye exams.  Stay current with your immunizations.   Do not use any tobacco products including cigarettes, chewing tobacco, or electronic cigarettes.  If you are pregnant, do not drink alcohol.  If you are breastfeeding, limit how much and how often you drink  alcohol.  Limit alcohol intake to no more than 1 drink per day for nonpregnant women. One drink equals 12 ounces of beer, 5 ounces of wine, or 1 ounces of hard liquor.  Do not use street drugs.  Do not share needles.  Ask your health care provider for help if you need support or information about quitting drugs.  Tell your health care provider if you often feel depressed.  Tell your health care provider if you have ever been abused or do not feel safe at home. Document Released: 02/11/2011 Document Revised: 12/13/2013 Document Reviewed: 06/30/2013 [facility] Patient Information 2015 [place], [place]. This information is not intended to replace advice given to you by your health care provider. Make sure you discuss any questions you have with your health care provider.

## 2015-09-30 LAB — VITAMIN D 25 HYDROXY (VIT D DEFICIENCY, FRACTURES): Vit D, 25-Hydroxy: 40 ng/mL (ref 30–100)

## 2015-09-30 LAB — URINALYSIS W MICROSCOPIC + REFLEX CULTURE
Bacteria, UA: NONE SEEN [HPF]
Bilirubin Urine: NEGATIVE
Casts: NONE SEEN [LPF]
Crystals: NONE SEEN [HPF]
Glucose, UA: NEGATIVE
HGB URINE DIPSTICK: NEGATIVE
Ketones, ur: NEGATIVE
NITRITE: NEGATIVE
PH: 6.5 (ref 5.0–8.0)
Protein, ur: NEGATIVE
RBC / HPF: NONE SEEN RBC/HPF (ref <=2)
Specific Gravity, Urine: 1.017 (ref 1.001–1.035)
Squamous Epithelial / LPF: NONE SEEN [HPF] (ref <=5)
WBC, UA: NONE SEEN WBC/HPF (ref <=5)
Yeast: NONE SEEN [HPF]

## 2015-10-02 LAB — URINE CULTURE

## 2015-10-04 ENCOUNTER — Other Ambulatory Visit: Payer: Self-pay | Admitting: Gynecology

## 2015-10-04 ENCOUNTER — Other Ambulatory Visit: Payer: Self-pay

## 2015-10-04 MED ORDER — NITROFURANTOIN MONOHYD MACRO 100 MG PO CAPS: 100.0000 mg | ORAL_CAPSULE | Freq: Three times a day (TID) | ORAL | Status: DC

## 2015-10-04 MED ORDER — NITROFURANTOIN MONOHYD MACRO 100 MG PO CAPS
100.0000 mg | ORAL_CAPSULE | Freq: Two times a day (BID) | ORAL | Status: DC
Start: 2015-10-04 — End: 2016-03-27

## 2015-10-04 NOTE — Addendum Note (Signed)
Addended by: Ramond Craver on: 10/04/2015 02:42 PM   Modules accepted: Orders

## 2015-10-04 NOTE — Progress Notes (Signed)
Macrobid 100 mg 3 times a day 7 days

## 2015-10-23 ENCOUNTER — Encounter: Payer: Self-pay | Admitting: Gynecology

## 2015-10-23 MED ORDER — FLUOXETINE HCL 20 MG PO CAPS: 20.0000 mg | ORAL_CAPSULE | Freq: Every day | ORAL | Status: DC

## 2015-10-23 NOTE — Telephone Encounter (Signed)
Recommend increasing to 20 mg once daily. See if this doesn't help. Occasionally we do use a twice daily routine but if we can avoid it that would be good

## 2015-11-09 ENCOUNTER — Other Ambulatory Visit: Payer: Self-pay | Admitting: Gynecology

## 2015-11-10 ENCOUNTER — Telehealth: Payer: Self-pay | Admitting: *Deleted

## 2015-11-10 ENCOUNTER — Encounter: Payer: Self-pay | Admitting: Gynecology

## 2015-11-10 MED ORDER — ESTRADIOL 0.075 MG/24HR TD PTTW: 1.0000 | MEDICATED_PATCH | TRANSDERMAL | Status: DC

## 2015-11-10 MED ORDER — PROGESTERONE MICRONIZED 100 MG PO CAPS: 100.0000 mg | ORAL_CAPSULE | Freq: Every day | ORAL | Status: DC

## 2015-11-10 NOTE — Telephone Encounter (Signed)
Both Rx sent, pt aware via my chart message.

## 2015-11-10 NOTE — Telephone Encounter (Signed)
Okay to refill both 1 year

## 2015-11-10 NOTE — Telephone Encounter (Signed)
Pt said that she started back on HRT because of the increased hot flashes they became unbearable, had some of the  vivelle dot patch 0.075 mg left, but needs Rx for progesterone 100 mg at bedtime. Okay to refill? Please advise

## 2015-11-13 ENCOUNTER — Encounter: Payer: Self-pay | Admitting: Gynecology

## 2015-11-13 ENCOUNTER — Other Ambulatory Visit: Payer: Self-pay

## 2015-11-13 MED ORDER — FLUOXETINE HCL 20 MG PO CAPS: 20.0000 mg | ORAL_CAPSULE | Freq: Every day | ORAL | Status: DC

## 2015-12-13 ENCOUNTER — Encounter: Payer: Self-pay | Admitting: Gynecology

## 2015-12-13 ENCOUNTER — Other Ambulatory Visit: Payer: Self-pay | Admitting: Gynecology

## 2015-12-13 MED ORDER — PROGESTERONE MICRONIZED 200 MG PO CAPS: 200.0000 mg | ORAL_CAPSULE | Freq: Every day | ORAL | Status: DC

## 2015-12-13 NOTE — Telephone Encounter (Signed)
Let's try increasing progesterone to 200 mg nightly from the 100 mg. Continue on the estrogen patches.  Suggest doing this for 6 months and then go back to the 100 mg and see how she does.

## 2016-01-15 DIAGNOSIS — Z1231 Encounter for screening mammogram for malignant neoplasm of breast: Secondary | ICD-10-CM | POA: Diagnosis not present

## 2016-01-18 DIAGNOSIS — H5203 Hypermetropia, bilateral: Secondary | ICD-10-CM | POA: Diagnosis not present

## 2016-02-20 ENCOUNTER — Encounter: Payer: Self-pay | Admitting: Gynecology

## 2016-02-20 NOTE — Telephone Encounter (Signed)
Sorry, I do not do cholesterol or hypertensive meds. To many things change and new things available that really should be dealt with by a physician who does this on a regular basis.

## 2016-02-27 DIAGNOSIS — M549 Dorsalgia, unspecified: Secondary | ICD-10-CM | POA: Diagnosis not present

## 2016-02-27 DIAGNOSIS — H832X9 Labyrinthine dysfunction, unspecified ear: Secondary | ICD-10-CM | POA: Diagnosis not present

## 2016-03-23 ENCOUNTER — Encounter: Payer: Self-pay | Admitting: Gynecology

## 2016-03-25 ENCOUNTER — Other Ambulatory Visit: Payer: Self-pay | Admitting: Gynecology

## 2016-03-25 DIAGNOSIS — N950 Postmenopausal bleeding: Secondary | ICD-10-CM

## 2016-03-25 NOTE — Telephone Encounter (Signed)
Recommend sonohysterogram. We did one about a year ago but I wanted go ahead and repeat this to make sure when are not missing something in the cavity.

## 2016-03-25 NOTE — Telephone Encounter (Signed)
Several different options here. First is not to do the sonohysterogram but do an endometrial biopsy which is painful but not as much as sonohysterogram. If she is interested in stopping hormones she could try something different like Effexor which is nonhormonal. I think this is a better conversation to have face-to-face with office visit.

## 2016-03-27 ENCOUNTER — Encounter: Payer: Self-pay | Admitting: Gynecology

## 2016-03-27 ENCOUNTER — Ambulatory Visit (INDEPENDENT_AMBULATORY_CARE_PROVIDER_SITE_OTHER): Payer: BLUE CROSS/BLUE SHIELD | Admitting: Gynecology

## 2016-03-27 VITALS — BP 120/76

## 2016-03-27 DIAGNOSIS — N950 Postmenopausal bleeding: Secondary | ICD-10-CM

## 2016-03-27 DIAGNOSIS — N951 Menopausal and female climacteric states: Secondary | ICD-10-CM

## 2016-03-27 MED ORDER — PROGESTERONE MICRONIZED 200 MG PO CAPS: 200.0000 mg | ORAL_CAPSULE | Freq: Every day | ORAL | 1 refills | Status: DC

## 2016-03-27 MED ORDER — ESTRADIOL 0.05 MG/24HR TD PTTW: 1.0000 | MEDICATED_PATCH | TRANSDERMAL | 2 refills | Status: DC

## 2016-03-27 NOTE — Telephone Encounter (Signed)
It would be highly unlikely

## 2016-03-27 NOTE — Patient Instructions (Signed)
Call me in 2 months to let me know how you're doing.

## 2016-03-27 NOTE — Progress Notes (Signed)
    Carol Chambers 18-Apr-1961 [id number]        55 y.o.  G1P0010 presents to discuss her menopausal symptoms and spotting history. Patient was on Vivelle 0.075 mg patches and Prometrium 100 mg nightly. Had some spotting 11 months ago and underwent sonohysterogram which showed thin endometrial echo at 3.8 mm in an endometrial biopsy that showed scant proliferative endometrium. She stopped her patches and initially did well but then developed mood swings which were addressed with Prozac and ultimately started having hot flashes and sweats and reinitiated her patches 0.075 and Prometrium 100 mg. Over the 2 months she's had some spotting and we adjusted her Prometrium to 200 mg. She's continued to have some spotting on and off.  Past medical history,surgical history, problem list, medications, allergies, family history and social history were all reviewed and documented in the EPIC chart.  Directed ROS with pertinent positives and negatives documented in the history of present illness/assessment and plan.  Exam: Vitals:   03/27/16 0901  BP: 120/76   General appearance:  Normal   Assessment/Plan:  55 y.o. G1P0010 with menopausal symptoms and intermittent spotting. Endometrial assessment within the past year was negative. I reviewed various options with the patient to include stopping her HRT altogether and may be adjusting her Prozac up a little bit to hopefully mitigate any worsening menopausal symptoms. Alternatives would be to switch her regimen either completely to something such as Activella or adjust her current regimen. I also reviewed with her whether we need to re-assess her endometrial cavity at this point ranging from repeat sonohysterogram to endometrial biopsy alone. Patient is very reluctant want to consider those options. I reviewed with her that unlikely given the thin endometrium less than a year ago with negative biopsy and on a higher dose of progesterone that she wouldn't have  developed any significant hyperplasia but I cannot guarantee this and she would have to accept that in the absence of sampling which she does. At this point going to try decreasing her estradiol patch to 0.05 mg and keep her Prometrium at 200 mg and she will call me in 2 months in follow up. She will call me sooner if she has any issues.    Anastasio Auerbach MD, 9:24 AM 03/27/2016

## 2016-05-08 ENCOUNTER — Other Ambulatory Visit: Payer: Self-pay | Admitting: Gynecology

## 2016-05-08 ENCOUNTER — Telehealth: Payer: Self-pay

## 2016-05-08 MED ORDER — DIAZEPAM 5 MG PO TABS: ORAL_TABLET | ORAL | 1 refills | Status: DC

## 2016-05-08 MED ORDER — ESTRADIOL 0.05 MG/24HR TD PTTW: 1.0000 | MEDICATED_PATCH | TRANSDERMAL | 3 refills | Status: DC

## 2016-05-08 MED ORDER — PROGESTERONE MICRONIZED 200 MG PO CAPS: 200.0000 mg | ORAL_CAPSULE | Freq: Every day | ORAL | 3 refills | Status: DC

## 2016-05-08 NOTE — Telephone Encounter (Signed)
Valium 5 mg #30 with 4 refills 1 by mouth every 6 hours when necessary anxiety

## 2016-05-08 NOTE — Telephone Encounter (Signed)
Okay to refill both through next annual exam

## 2016-05-08 NOTE — Telephone Encounter (Signed)
The request was from her mail order pharmacy and for a 90 days supply which is what she received last Rx. Please advise.

## 2016-05-08 NOTE — Telephone Encounter (Signed)
Patient e-mailed back on her refill email. "he wanted me to get back with him in 2 months to let him know how I was doing on the 0.05 mg patch and the 200 mg prog. So far I've only had a little brown discharge no bleeding. If this is what he thinks I should stay on, is it to early to call them into the [facility]? [facility] know [facility] as of Oct. 1. it will still be mail order though. Thank you"  If you want her to stay on it I will send Rxs.

## 2016-05-08 NOTE — Telephone Encounter (Signed)
A for 90 day supply refill 1

## 2016-05-08 NOTE — Telephone Encounter (Signed)
Rx's sent and email sent back to patient letting her know.

## 2016-07-26 ENCOUNTER — Ambulatory Visit (INDEPENDENT_AMBULATORY_CARE_PROVIDER_SITE_OTHER): Payer: BLUE CROSS/BLUE SHIELD | Admitting: Physician Assistant

## 2016-07-26 ENCOUNTER — Encounter: Payer: Self-pay | Admitting: Physician Assistant

## 2016-07-26 VITALS — BP 107/68 | HR 70 | Temp 98.0°F | Ht 62.0 in | Wt 166.6 lb

## 2016-07-26 DIAGNOSIS — M19019 Primary osteoarthritis, unspecified shoulder: Secondary | ICD-10-CM

## 2016-07-26 MED ORDER — MELOXICAM 15 MG PO TABS: 15.0000 mg | ORAL_TABLET | Freq: Every day | ORAL | 1 refills | Status: DC

## 2016-07-26 NOTE — Progress Notes (Signed)
.   BP 107/68   Pulse 70   Temp 98 F (36.7 C) (Oral)   Ht 5\' 2"  (1.575 m)   Wt 166 lb 9.6 oz (75.6 kg)   LMP 05/26/2014   BMI 30.47 kg/m    Subjective:    Patient ID: Carol Chambers, female    DOB: Jan 01, 1961, 55 y.o.   MRN: [id number]  HPI: Carol Chambers is a 55 y.o. female presenting on 07/26/2016 for Neck Pain (Since before Thanksgiving ) and Shoulder Pain  Approximately one month ago patient had increased pain in the left shoulder. It hurt more with lifting and raising. She has been working out most days at lunch. She also been doing yoga several days in the evening. She had great increase with lifting things above her head. She denies any deformity to the shoulder. She has no prior injury.  Relevant past medical, surgical, family and social history reviewed and updated as indicated. Allergies and medications reviewed and updated.  Past Medical History:  Diagnosis Date  . Acid reflux   . Anemia   . History of kidney stones   . Hypercholesteremia   . Hypothyroidism   . PONV (postoperative nausea and vomiting)    with Cholecystectomy  . Seasonal allergies     Past Surgical History:  Procedure Laterality Date  . BRAVO [place] STUDY N/A 08/23/2014   Procedure: BRAVO [place] STUDY;  Surgeon: Inda Castle, MD;  Location: [facility];  Service: Endoscopy;  Laterality: N/A;  48 hour bravo pH study with impedance plethysmography   . BRAVO [place] STUDY N/A 10/10/2014   Procedure: BRAVO [place] STUDY;  Surgeon: Inda Castle, MD;  Location: [facility];  Service: Endoscopy;  Laterality: N/A;  2nd attempt  . CARPAL TUNNEL RELEASE  2003   bilat  . ESOPHAGOGASTRODUODENOSCOPY (EGD) WITH PROPOFOL N/A 08/23/2014   Procedure: ESOPHAGOGASTRODUODENOSCOPY (EGD) WITH PROPOFOL;  Surgeon: Inda Castle, MD;  Location: [facility];  Service: Endoscopy;  Laterality: N/A;  . ESOPHAGOGASTRODUODENOSCOPY (EGD) WITH PROPOFOL N/A 10/10/2014   Procedure: ESOPHAGOGASTRODUODENOSCOPY (EGD) WITH PROPOFOL;  Surgeon:  Inda Castle, MD;  Location: [facility];  Service: Endoscopy;  Laterality: N/A;  . Esophagus stretched     X 2  . FOOT SURGERY     03-24-14 Plantar Fasciatitis  . FOOT SURGERY Left    foot surgery 09-29-14  . groin cyst removed  2000   right  . LAPAROSCOPIC CHOLECYSTECTOMY  2003    Review of Systems  Constitutional: Negative.   HENT: Negative.   Eyes: Negative.   Respiratory: Negative.   Gastrointestinal: Negative.   Genitourinary: Negative.   Musculoskeletal: Positive for arthralgias. Negative for joint swelling, neck pain and neck stiffness.    Allergies as of 07/26/2016      Reactions   Sulfa Antibiotics Hives   Tequin Swelling   Swelling of tongue   Codeine Itching   Vicodin [hydrocodone-acetaminophen] Other (See Comments)   Hallucinations      Medication List       Accurate as of 07/26/16 10:49 AM. Always use your most recent med list.          ATARAX PO Take 25 mg by mouth at bedtime.   cyclobenzaprine 10 MG tablet Commonly known as:  FLEXERIL Take 10 mg by mouth 3 (three) times daily as needed for muscle spasms.   diazepam 5 MG tablet Commonly known as:  VALIUM One by mouth every six hours as needed for anxiety.   estradiol 0.05 MG/24HR  patch Commonly known as:  VIVELLE-DOT Place 1 patch (0.05 mg total) onto the skin 2 (two) times a week.   GNP MAGNESIUM 250 MG Tabs Generic drug:  Magnesium Take 2 tablets by mouth daily.   levothyroxine 100 MCG tablet Commonly known as:  SYNTHROID, LEVOTHROID Take 100 mcg by mouth daily.   meloxicam 15 MG tablet Commonly known as:  MOBIC Take 1 tablet (15 mg total) by mouth daily.   omeprazole 40 MG capsule Commonly known as:  PRILOSEC Take 40 mg by mouth daily.   progesterone 200 MG capsule Commonly known as:  PROMETRIUM Take 1 capsule (200 mg total) by mouth daily.   simvastatin 20 MG tablet Commonly known as:  ZOCOR Take 20 mg by mouth at bedtime.   VITAMIN D PO Take by mouth.            Objective:    BP 107/68   Pulse 70   Temp 98 F (36.7 C) (Oral)   Ht 5\' 2"  (1.575 m)   Wt 166 lb 9.6 oz (75.6 kg)   LMP 05/26/2014   BMI 30.47 kg/m   Allergies  Allergen Reactions  . Sulfa Antibiotics Hives  . Tequin Swelling    Swelling of tongue  . Codeine Itching  . Vicodin [Hydrocodone-Acetaminophen] Other (See Comments)    Hallucinations     Physical Exam  Constitutional: She is oriented to person, place, and time. She appears well-developed and well-nourished.  HENT:  Head: Normocephalic and atraumatic.  Eyes: Conjunctivae and EOM are normal. Pupils are equal, round, and reactive to light.  Cardiovascular: Normal rate, regular rhythm, normal heart sounds and intact distal pulses.   Pulmonary/Chest: Effort normal and breath sounds normal.  Abdominal: Soft. Bowel sounds are normal.  Musculoskeletal:       Left shoulder: She exhibits tenderness and crepitus. She exhibits normal range of motion and no swelling.  Negative Hawkins and empty can  Neurological: She is alert and oriented to person, place, and time. She has normal reflexes.  Skin: Skin is warm and dry. No rash noted.  Psychiatric: She has a normal mood and affect. Her behavior is normal. Judgment and thought content normal.        Assessment & Plan:   1. Arthropathy of shoulder region - meloxicam (MOBIC) 15 MG tablet; Take 1 tablet (15 mg total) by mouth daily.  Dispense: 30 tablet; Refill: 1   Continue all other maintenance medications as listed above.  Follow up plan: Return if symptoms worsen or fail to improve.  Educational handout given for shoulder injury  Terald Sleeper PA-C [place] [address]  [place], [place] [address] [phone]   07/26/2016, 10:49 AM

## 2016-07-26 NOTE — Patient Instructions (Signed)
Rotator Cuff Injury Rotator cuff injury is any type of injury to the set of muscles and tendons that make up the stabilizing unit of your shoulder. This unit holds the ball of your upper arm bone (humerus) in the socket of your shoulder blade (scapula). What are the causes? Injuries to your rotator cuff most commonly come from sports or activities that cause your arm to be moved repeatedly over your head. Examples of this include throwing, weight lifting, swimming, or racquet sports. Long lasting (chronic) irritation of your rotator cuff can cause soreness and swelling (inflammation), bursitis, and eventual damage to your tendons, such as a tear (rupture). What are the signs or symptoms? Acute rotator cuff tear:  Sudden tearing sensation followed by severe pain shooting from your upper shoulder down your arm toward your elbow.  Decreased range of motion of your shoulder because of pain and muscle spasm.  Severe pain.  Inability to raise your arm out to the side because of pain and loss of muscle power (large tears). Chronic rotator cuff tear:  Pain that usually is worse at night and may interfere with sleep.  Gradual weakness and decreased shoulder motion as the pain worsens.  Decreased range of motion. Rotator cuff tendinitis:  Deep ache in your shoulder and the outside upper arm over your shoulder.  Pain that comes on gradually and becomes worse when lifting your arm to the side or turning it inward. How is this diagnosed? Rotator cuff injury is diagnosed through a medical history, physical exam, and imaging exam. The medical history helps determine the type of rotator cuff injury. Your health care provider will look at your injured shoulder, feel the injured area, and ask you to move your shoulder in different positions. X-ray exams typically are done to rule out other causes of shoulder pain, such as fractures. MRI is the exam of choice for the most severe shoulder injuries because the  images show muscles and tendons. How is this treated? Chronic tear:  Medicine for pain, such as acetaminophen or ibuprofen.  Physical therapy and range-of-motion exercises may be helpful in maintaining shoulder function and strength.  Steroid injections into your shoulder joint.  Surgical repair of the rotator cuff if the injury does not heal with noninvasive treatment. Acute tear:  Anti-inflammatory medicines such as ibuprofen and naproxen to help reduce pain and swelling.  A sling to help support your arm and rest your rotator cuff muscles. Long-term use of a sling is not advised. It may cause significant stiffening of the shoulder joint.  Surgery may be considered within a few weeks, especially in younger, active people, to return the shoulder to full function.  Indications for surgical treatment include the following:  Age younger than 60 years.  Rotator cuff tears that are complete.  Physical therapy, rest, and anti-inflammatory medicines have been used for 6-8 weeks, with no improvement.  Employment or sporting activity that requires constant shoulder use. Tendinitis:  Anti-inflammatory medicines such as ibuprofen and naproxen to help reduce pain and swelling.  A sling to help support your arm and rest your rotator cuff muscles. Long-term use of a sling is not advised. It may cause significant stiffening of the shoulder joint.  Severe tendinitis may require:  Steroid injections into your shoulder joint.  Physical therapy.  Surgery. Follow these instructions at home:  Apply ice to your injury:  Put ice in a plastic bag.  Place a towel between your skin and the bag.  Leave the ice on for   20 minutes, 2-3 times a day.  If you have a shoulder immobilizer (sling and straps), wear it until told otherwise by your health care provider.  You may want to sleep on several pillows or in a recliner at night to lessen swelling and pain.  Only take over-the-counter or  prescription medicines for pain, discomfort, or fever as directed by your health care provider.  Do simple hand squeezing exercises with a soft rubber ball to decrease hand swelling. Contact a health care provider if:  Your shoulder pain increases, or new pain or numbness develops in your arm, hand, or fingers.  Your hand or fingers are colder than your other hand. Get help right away if:  Your arm, hand, or fingers are numb or tingling.  Your arm, hand, or fingers are increasingly swollen and painful, or they turn white or blue. This information is not intended to replace advice given to you by your health care provider. Make sure you discuss any questions you have with your health care provider. Document Released: 07/26/2000 Document Revised: 01/04/2016 Document Reviewed: 03/10/2013 Elsevier Interactive Patient Education  2017 [employer] Inc[employer]  

## 2016-09-11 ENCOUNTER — Other Ambulatory Visit: Payer: Self-pay

## 2016-09-11 MED ORDER — PHENTERMINE HCL 37.5 MG PO CAPS: 37.5000 mg | ORAL_CAPSULE | ORAL | 1 refills | Status: DC

## 2016-10-03 ENCOUNTER — Other Ambulatory Visit: Payer: Self-pay | Admitting: *Deleted

## 2016-10-03 ENCOUNTER — Encounter: Payer: Self-pay | Admitting: Physician Assistant

## 2016-10-03 DIAGNOSIS — E785 Hyperlipidemia, unspecified: Secondary | ICD-10-CM

## 2016-10-03 DIAGNOSIS — E039 Hypothyroidism, unspecified: Secondary | ICD-10-CM

## 2016-10-03 DIAGNOSIS — Z0000 Encounter for general adult medical examination without abnormal findings: Secondary | ICD-10-CM

## 2016-10-04 ENCOUNTER — Ambulatory Visit (INDEPENDENT_AMBULATORY_CARE_PROVIDER_SITE_OTHER): Payer: BLUE CROSS/BLUE SHIELD | Admitting: Gynecology

## 2016-10-04 ENCOUNTER — Other Ambulatory Visit: Payer: BLUE CROSS/BLUE SHIELD

## 2016-10-04 ENCOUNTER — Encounter: Payer: Self-pay | Admitting: Gynecology

## 2016-10-04 VITALS — BP 118/76 | Ht 63.0 in | Wt 167.0 lb

## 2016-10-04 DIAGNOSIS — E785 Hyperlipidemia, unspecified: Secondary | ICD-10-CM | POA: Diagnosis not present

## 2016-10-04 DIAGNOSIS — Z01411 Encounter for gynecological examination (general) (routine) with abnormal findings: Secondary | ICD-10-CM | POA: Diagnosis not present

## 2016-10-04 DIAGNOSIS — Z0000 Encounter for general adult medical examination without abnormal findings: Secondary | ICD-10-CM

## 2016-10-04 DIAGNOSIS — N952 Postmenopausal atrophic vaginitis: Secondary | ICD-10-CM | POA: Diagnosis not present

## 2016-10-04 DIAGNOSIS — E039 Hypothyroidism, unspecified: Secondary | ICD-10-CM | POA: Diagnosis not present

## 2016-10-04 MED ORDER — FLUOXETINE HCL 20 MG PO CAPS: 20.0000 mg | ORAL_CAPSULE | Freq: Every day | ORAL | 4 refills | Status: DC

## 2016-10-04 NOTE — Progress Notes (Signed)
    Carol Chambers 07-Jan-1961 [id number]        56 y.o.  G1P0010 for annual exam.    Past medical history,surgical history, problem list, medications, allergies, family history and social history were all reviewed and documented as reviewed in the EPIC chart.  ROS:  Performed with pertinent positives and negatives included in the history, assessment and plan.   Additional significant findings :  None   Exam: [name] assistant Vitals:   10/04/16 1215  BP: 118/76  Weight: 167 lb (75.8 kg)  Height: 5\' 3"  (1.6 m)   Body mass index is 29.58 kg/m.  General appearance:  Normal affect, orientation and appearance. Skin: Grossly normal HEENT: Without gross lesions.  No cervical or supraclavicular adenopathy. Thyroid normal.  Lungs:  Clear without wheezing, rales or rhonchi Cardiac: RR, without RMG Abdominal:  Soft, nontender, without masses, guarding, rebound, organomegaly or hernia Breasts:  Examined lying and sitting without masses, retractions, discharge or axillary adenopathy. Pelvic:  Ext, BUS, Vagina: Normal with mild atrophic changes  Cervix: Normal with mild atrophic changes  Uterus: Anteverted, normal size, shape and contour, midline and mobile nontender   Adnexa: Without masses or tenderness    Anus and perineum: Normal   Rectovaginal: Normal sphincter tone without palpated masses or tenderness.    Assessment/Plan:  56 y.o. G15P0010 female for annual exam.  1. Postmenopausal/atrophic genital changes. Patient had been on HRT previously. Had started due to anxiety short temper. Had postmenopausal bleeding with workup 2016. Had tried different HRT regimens with some spotting on and off and also only has decided to stop this. Was started on fluoxetine 24 g daily and notes that this has corrected her anxiety short temper and she is doing well. No significant hot flushes, night sweats, vaginal dryness or any bleeding. Prefers to stay off of HRT. Refill for fluoxetine 20 mg daily  provided 1 year. Will follow up if any issues and knows to call she does any bleeding. 2. Mammography 01/2016. Continue with annual mammography when due. SBE monthly reviewed. 3. Pap smear/HPV 09/2014 negative. No Pap smear done today. No history of abnormal Pap smears previously. Plan repeat Pap smear at 5 year interval per current screening guidelines. 4. Colonoscopy 2014. Repeat at their recommended interval. 5. DEXA never. We'll plan further into the menopause. 6. Health maintenance. Patient had routine lab work drawn this morning through her primary physician's office. Follow up in one year, sooner as needed.   Anastasio Auerbach MD, 12:43 PM 10/04/2016

## 2016-10-04 NOTE — Patient Instructions (Signed)

## 2016-10-05 LAB — CBC WITH DIFFERENTIAL/PLATELET
BASOS: 1 %
Basophils Absolute: 0 10*3/uL (ref 0.0–0.2)
EOS (ABSOLUTE): 0.1 10*3/uL (ref 0.0–0.4)
EOS: 2 %
HEMATOCRIT: 41.9 % (ref 34.0–46.6)
Hemoglobin: 14.4 g/dL (ref 11.1–15.9)
IMMATURE GRANULOCYTES: 0 %
Immature Grans (Abs): 0 10*3/uL (ref 0.0–0.1)
Lymphocytes Absolute: 1.7 10*3/uL (ref 0.7–3.1)
Lymphs: 29 %
MCH: 30.8 pg (ref 26.6–33.0)
MCHC: 34.4 g/dL (ref 31.5–35.7)
MCV: 90 fL (ref 79–97)
MONOS ABS: 0.5 10*3/uL (ref 0.1–0.9)
Monocytes: 9 %
NEUTROS ABS: 3.5 10*3/uL (ref 1.4–7.0)
NEUTROS PCT: 59 %
Platelets: 253 10*3/uL (ref 150–379)
RBC: 4.67 x10E6/uL (ref 3.77–5.28)
RDW: 13.2 % (ref 12.3–15.4)
WBC: 5.8 10*3/uL (ref 3.4–10.8)

## 2016-10-05 LAB — CMP14+EGFR
ALBUMIN: 4.2 g/dL (ref 3.5–5.5)
ALT: 12 IU/L (ref 0–32)
AST: 15 IU/L (ref 0–40)
Albumin/Globulin Ratio: 1.9 (ref 1.2–2.2)
Alkaline Phosphatase: 71 IU/L (ref 39–117)
BUN / CREAT RATIO: 13 (ref 9–23)
BUN: 11 mg/dL (ref 6–24)
Bilirubin Total: 0.5 mg/dL (ref 0.0–1.2)
CALCIUM: 9.2 mg/dL (ref 8.7–10.2)
CO2: 24 mmol/L (ref 18–29)
CREATININE: 0.85 mg/dL (ref 0.57–1.00)
Chloride: 101 mmol/L (ref 96–106)
GFR, EST AFRICAN AMERICAN: 90 mL/min/{1.73_m2} (ref >59)
GFR, EST NON AFRICAN AMERICAN: 78 mL/min/{1.73_m2} (ref >59)
GLOBULIN, TOTAL: 2.2 g/dL (ref 1.5–4.5)
Glucose: 84 mg/dL (ref 65–99)
Potassium: 4.3 mmol/L (ref 3.5–5.2)
SODIUM: 142 mmol/L (ref 134–144)
TOTAL PROTEIN: 6.4 g/dL (ref 6.0–8.5)

## 2016-10-05 LAB — LIPID PANEL
CHOL/HDL RATIO: 2.4 ratio (ref 0.0–4.4)
Cholesterol, Total: 206 mg/dL — ABNORMAL HIGH (ref 100–199)
HDL: 85 mg/dL (ref >39)
LDL Calculated: 100 mg/dL — ABNORMAL HIGH (ref 0–99)
Triglycerides: 107 mg/dL (ref 0–149)
VLDL CHOLESTEROL CAL: 21 mg/dL (ref 5–40)

## 2016-10-05 LAB — THYROID PANEL WITH TSH
Free Thyroxine Index: 1.4 (ref 1.2–4.9)
T3 Uptake Ratio: 25 % (ref 24–39)
T4, Total: 5.7 ug/dL (ref 4.5–12.0)
TSH: 1.68 u[IU]/mL (ref 0.450–4.500)

## 2016-10-08 ENCOUNTER — Other Ambulatory Visit: Payer: Self-pay | Admitting: *Deleted

## 2016-10-08 DIAGNOSIS — S92511A Displaced fracture of proximal phalanx of right lesser toe(s), initial encounter for closed fracture: Secondary | ICD-10-CM | POA: Diagnosis not present

## 2016-10-08 MED ORDER — SIMVASTATIN 20 MG PO TABS: 20.0000 mg | ORAL_TABLET | Freq: Every day | ORAL | 2 refills | Status: DC

## 2016-10-08 MED ORDER — OMEPRAZOLE 40 MG PO CPDR: 40.0000 mg | DELAYED_RELEASE_CAPSULE | Freq: Every day | ORAL | 2 refills | Status: DC

## 2016-10-08 MED ORDER — LEVOTHYROXINE SODIUM 100 MCG PO TABS: 100.0000 ug | ORAL_TABLET | Freq: Every day | ORAL | 2 refills | Status: DC

## 2016-10-09 ENCOUNTER — Telehealth: Payer: Self-pay | Admitting: Physician Assistant

## 2016-10-09 NOTE — Telephone Encounter (Signed)
Pharm called  And confirmed new dose

## 2016-10-09 NOTE — Telephone Encounter (Signed)
Yes refill one year on omeprazole 40 mg one daily

## 2016-11-23 DIAGNOSIS — H04123 Dry eye syndrome of bilateral lacrimal glands: Secondary | ICD-10-CM | POA: Diagnosis not present

## 2016-11-23 DIAGNOSIS — H40033 Anatomical narrow angle, bilateral: Secondary | ICD-10-CM | POA: Diagnosis not present

## 2016-11-23 DIAGNOSIS — H5210 Myopia, unspecified eye: Secondary | ICD-10-CM | POA: Diagnosis not present

## 2017-01-17 ENCOUNTER — Encounter: Payer: Self-pay | Admitting: Gynecology

## 2017-01-17 DIAGNOSIS — Z1231 Encounter for screening mammogram for malignant neoplasm of breast: Secondary | ICD-10-CM | POA: Diagnosis not present

## 2017-01-30 ENCOUNTER — Other Ambulatory Visit: Payer: Self-pay

## 2017-01-30 NOTE — Telephone Encounter (Signed)
Check with patient. At last discussion I thought that she was not using the Valium anymore. I know she is on the fluoxetine and was doing well with this.

## 2017-01-30 NOTE — Telephone Encounter (Signed)
Patient said the pharmacy created those refills. She said she does not need either one (Prozac or Valium). No refills sent.

## 2017-03-05 ENCOUNTER — Ambulatory Visit (INDEPENDENT_AMBULATORY_CARE_PROVIDER_SITE_OTHER): Payer: BLUE CROSS/BLUE SHIELD | Admitting: Physician Assistant

## 2017-03-05 ENCOUNTER — Encounter: Payer: Self-pay | Admitting: Physician Assistant

## 2017-03-05 VITALS — BP 103/64 | HR 83 | Temp 98.2°F | Ht 63.0 in | Wt 175.6 lb

## 2017-03-05 DIAGNOSIS — B360 Pityriasis versicolor: Secondary | ICD-10-CM | POA: Diagnosis not present

## 2017-03-05 DIAGNOSIS — K1370 Unspecified lesions of oral mucosa: Secondary | ICD-10-CM | POA: Diagnosis not present

## 2017-03-05 MED ORDER — FLUCONAZOLE 150 MG PO TABS: 300.0000 mg | ORAL_TABLET | ORAL | 1 refills | Status: DC

## 2017-03-05 MED ORDER — PHENTERMINE HCL 37.5 MG PO TABS
37.5000 mg | ORAL_TABLET | Freq: Every day | ORAL | 1 refills | Status: DC
Start: 2017-03-05 — End: 2017-09-15

## 2017-03-05 NOTE — Progress Notes (Signed)
BP 103/64   Pulse 83   Temp 98.2 F (36.8 C) (Oral)   Ht 5\' 3"  (1.6 m)   Wt 175 lb 9.6 oz (79.7 kg)   LMP 05/26/2014   BMI 31.11 kg/m    Subjective:    Patient ID: Carol Chambers, female    DOB: [birth year], 56 y.o.   MRN: [id number]  HPI: Carol Chambers is a 56 y.o. female presenting on 03/05/2017 for sores in mouth and spots on back  This patient comes in for periodic recheck on medications and conditions including new growths in lip and recurrent tinea versicolor. She has had 3 small areas growing inside her lower lip. The largest one is on the left side and is getting hit by her teeth at times. Her dentist had her use a mouthguard because he thought she may have been biting on it at night. She claims that she has never been a little biter. She has also had a recurrence of her tinea versicolor. It is been several years since she has had it. After her trip to the beach the white patches were visible on her back.   All medications are reviewed today. There are no reports of any problems with the medications. All of the medical conditions are reviewed and updated.  Lab work is reviewed and will be ordered as medically necessary. There are no new problems reported with today's visit.   Relevant past medical, surgical, family and social history reviewed and updated as indicated. Allergies and medications reviewed and updated.  Past Medical History:  Diagnosis Date  . Acid reflux   . Anemia   . History of kidney stones   . Hypercholesteremia   . Hypothyroidism   . PONV (postoperative nausea and vomiting)    with Cholecystectomy  . Seasonal allergies     Past Surgical History:  Procedure Laterality Date  . BRAVO [place] STUDY N/A 08/23/2014   Procedure: BRAVO [place] STUDY;  Surgeon: Inda Castle, MD;  Location: [facility];  Service: Endoscopy;  Laterality: N/A;  48 hour bravo pH study with impedance plethysmography   . BRAVO [place] STUDY N/A 10/10/2014   Procedure: BRAVO [place] STUDY;  Surgeon:  Inda Castle, MD;  Location: [facility];  Service: Endoscopy;  Laterality: N/A;  2nd attempt  . CARPAL TUNNEL RELEASE  2003   bilat  . ESOPHAGOGASTRODUODENOSCOPY (EGD) WITH PROPOFOL N/A 08/23/2014   Procedure: ESOPHAGOGASTRODUODENOSCOPY (EGD) WITH PROPOFOL;  Surgeon: Inda Castle, MD;  Location: [facility];  Service: Endoscopy;  Laterality: N/A;  . ESOPHAGOGASTRODUODENOSCOPY (EGD) WITH PROPOFOL N/A 10/10/2014   Procedure: ESOPHAGOGASTRODUODENOSCOPY (EGD) WITH PROPOFOL;  Surgeon: Inda Castle, MD;  Location: [facility];  Service: Endoscopy;  Laterality: N/A;  . Esophagus stretched     X 2  . FOOT SURGERY     03-24-14 Plantar Fasciatitis  . FOOT SURGERY Left    foot surgery 09-29-14  . groin cyst removed  2000   right  . LAPAROSCOPIC CHOLECYSTECTOMY  2003    Review of Systems  Constitutional: Negative.  Negative for activity change, fatigue and fever.  HENT: Positive for mouth sores.   Eyes: Negative.   Respiratory: Negative.  Negative for cough.   Cardiovascular: Negative.  Negative for chest pain.  Gastrointestinal: Negative.  Negative for abdominal pain.  Endocrine: Negative.   Genitourinary: Negative.  Negative for dysuria.  Musculoskeletal: Negative.   Skin: Positive for color change and pallor.  Neurological: Negative.     Allergies as of  03/05/2017      Reactions   Sulfa Antibiotics Hives   Tequin Swelling   Swelling of tongue   Codeine Itching   Vicodin [hydrocodone-acetaminophen] Other (See Comments)   Hallucinations      Medication List       Accurate as of 03/05/17 11:08 AM. Always use your most recent med list.          ATARAX PO Take 25 mg by mouth at bedtime.   diazepam 5 MG tablet Commonly known as:  VALIUM One by mouth every six hours as needed for anxiety.   fluconazole 150 MG tablet Commonly known as:  DIFLUCAN Take 2 tablets (300 mg total) by mouth once a week.   FLUoxetine 20 MG capsule Commonly known as:  PROZAC Take 1 capsule  (20 mg total) by mouth daily.   GNP MAGNESIUM 250 MG Tabs Generic drug:  Magnesium Take 2 tablets by mouth daily.   levothyroxine 100 MCG tablet Commonly known as:  SYNTHROID, LEVOTHROID Take 1 tablet (100 mcg total) by mouth daily.   omeprazole 40 MG capsule Commonly known as:  PRILOSEC Take 1 capsule (40 mg total) by mouth daily.   phentermine 37.5 MG tablet Commonly known as:  ADIPEX-P Take 1 tablet (37.5 mg total) by mouth daily before breakfast.   simvastatin 20 MG tablet Commonly known as:  ZOCOR Take 1 tablet (20 mg total) by mouth at bedtime.   VITAMIN D PO Take by mouth.          Objective:    BP 103/64   Pulse 83   Temp 98.2 F (36.8 C) (Oral)   Ht 5\' 3"  (1.6 m)   Wt 175 lb 9.6 oz (79.7 kg)   LMP 05/26/2014   BMI 31.11 kg/m   Allergies  Allergen Reactions  . Sulfa Antibiotics Hives  . Tequin Swelling    Swelling of tongue  . Codeine Itching  . Vicodin [Hydrocodone-Acetaminophen] Other (See Comments)    Hallucinations     Physical Exam  Constitutional: She is oriented to person, place, and time. She appears well-developed and well-nourished.  HENT:  Head: Normocephalic and atraumatic.  3 small hypertrophic lesions that appear almost like skin tags on the inner lining of the lower lip. The one on the left is large enough to touch the teeth. No signs of trauma.   Eyes: Pupils are equal, round, and reactive to light. Conjunctivae and EOM are normal.  Cardiovascular: Normal rate, regular rhythm, normal heart sounds and intact distal pulses.   Pulmonary/Chest: Effort normal and breath sounds normal.  Abdominal: Soft. Bowel sounds are normal.  Neurological: She is alert and oriented to person, place, and time. She has normal reflexes.  Skin: Skin is warm and dry. No rash noted.  Psychiatric: She has a normal mood and affect. Her behavior is normal. Judgment and thought content normal.  Nursing note and vitals reviewed.       Assessment & Plan:     1. Mouth lesion - Ambulatory referral to ENT  2. Tinea versicolor - fluconazole (DIFLUCAN) 150 MG tablet; Take 2 tablets (300 mg total) by mouth once a week.  Dispense: 4 tablet; Refill: 1   Current Outpatient Prescriptions:  .  Cholecalciferol (VITAMIN D PO), Take by mouth., Disp: , Rfl:  .  diazepam (VALIUM) 5 MG tablet, One by mouth every six hours as needed for anxiety., Disp: 90 tablet, Rfl: 1 .  FLUoxetine (PROZAC) 20 MG capsule, Take 1 capsule (20 mg total)  by mouth daily., Disp: 90 capsule, Rfl: 4 .  HydrOXYzine HCl (ATARAX PO), Take 25 mg by mouth at bedtime. , Disp: , Rfl:  .  levothyroxine (SYNTHROID, LEVOTHROID) 100 MCG tablet, Take 1 tablet (100 mcg total) by mouth daily., Disp: 90 tablet, Rfl: 2 .  Magnesium (GNP MAGNESIUM) 250 MG TABS, Take 2 tablets by mouth daily., Disp: , Rfl:  .  omeprazole (PRILOSEC) 40 MG capsule, Take 1 capsule (40 mg total) by mouth daily., Disp: 90 capsule, Rfl: 2 .  simvastatin (ZOCOR) 20 MG tablet, Take 1 tablet (20 mg total) by mouth at bedtime., Disp: 90 tablet, Rfl: 2 .  fluconazole (DIFLUCAN) 150 MG tablet, Take 2 tablets (300 mg total) by mouth once a week., Disp: 4 tablet, Rfl: 1 .  phentermine (ADIPEX-P) 37.5 MG tablet, Take 1 tablet (37.5 mg total) by mouth daily before breakfast., Disp: 30 tablet, Rfl: 1  Continue all other maintenance medications as listed above.  Follow up plan: Return if symptoms worsen or fail to improve.  Educational handout given for [place] Shannon PA-C [facility] [address]  [place], [place] [address] [phone]   03/05/2017, 11:08 AM

## 2017-03-05 NOTE — Patient Instructions (Signed)
In a few days you may receive a survey in the mail or online from [employer] regarding your visit with us today. Please take a moment to fill this out. Your feedback is very important to our whole office. It can help us better understand your needs as well as improve your experience and satisfaction. Thank you for taking your time to complete it. We care about you.  Samaad Hashem, PA-C  

## 2017-03-10 ENCOUNTER — Encounter: Payer: Self-pay | Admitting: Physician Assistant

## 2017-03-17 DIAGNOSIS — D100 Benign neoplasm of lip: Secondary | ICD-10-CM | POA: Diagnosis not present

## 2017-03-17 DIAGNOSIS — M65312 Trigger thumb, left thumb: Secondary | ICD-10-CM | POA: Diagnosis not present

## 2017-04-28 DIAGNOSIS — M65312 Trigger thumb, left thumb: Secondary | ICD-10-CM | POA: Diagnosis not present

## 2017-06-23 ENCOUNTER — Other Ambulatory Visit: Payer: Self-pay | Admitting: Orthopedic Surgery

## 2017-08-03 ENCOUNTER — Other Ambulatory Visit: Payer: Self-pay | Admitting: Physician Assistant

## 2017-08-06 NOTE — Telephone Encounter (Signed)
Last lipid 10/04/16  Carol Chambers

## 2017-08-07 ENCOUNTER — Encounter (HOSPITAL_BASED_OUTPATIENT_CLINIC_OR_DEPARTMENT_OTHER): Admission: RE | Payer: Self-pay | Source: Ambulatory Visit

## 2017-08-07 ENCOUNTER — Ambulatory Visit (HOSPITAL_BASED_OUTPATIENT_CLINIC_OR_DEPARTMENT_OTHER)
Admission: RE | Admit: 2017-08-07 | Payer: BLUE CROSS/BLUE SHIELD | Source: Ambulatory Visit | Admitting: Orthopedic Surgery

## 2017-08-07 SURGERY — RELEASE, A1 PULLEY, FOR TRIGGER FINGER
Anesthesia: Choice | Laterality: Left

## 2017-08-14 ENCOUNTER — Other Ambulatory Visit: Payer: Self-pay | Admitting: Physician Assistant

## 2017-09-08 DIAGNOSIS — M65311 Trigger thumb, right thumb: Secondary | ICD-10-CM | POA: Diagnosis not present

## 2017-09-15 ENCOUNTER — Ambulatory Visit (INDEPENDENT_AMBULATORY_CARE_PROVIDER_SITE_OTHER): Payer: BLUE CROSS/BLUE SHIELD | Admitting: Gastroenterology

## 2017-09-15 ENCOUNTER — Encounter: Payer: Self-pay | Admitting: Gastroenterology

## 2017-09-15 VITALS — BP 118/60 | HR 62 | Ht 63.0 in | Wt 190.0 lb

## 2017-09-15 DIAGNOSIS — R053 Chronic cough: Secondary | ICD-10-CM

## 2017-09-15 DIAGNOSIS — R05 Cough: Secondary | ICD-10-CM | POA: Diagnosis not present

## 2017-09-15 NOTE — Progress Notes (Signed)
Day GI Progress Note  Chief Complaint: Chronic cough  Subjective  History:  This is a 57 year old woman seeing me for the first time, having last seen Dr. Deatra Ina in March 2016.  She had been seen previously in December 2015 and November 2014.  She initially saw him for cough with some concern that this may be related to reflux.  March 2016 office note indicates that a Bravo pH study done off medications showed reflux on day 1 but not day 2, curiously no cough reported during those 2 days. EGD January 2016 showed 2 diminutive shallow ulcers in the duodenal bulb.  The esophagus was normal.  She is here today at the request of her primary care provider and the PA who has evaluated her at her job for chronic cough and concern for reflux as a cause.  After she last saw Dr. Deatra Ina, it sounds like the chronic cough gradually subsided for unclear reasons.  She was well until about sometime last November when she describes a sinus infection that required antibiotics and steroids.  With that, she had recurrence of the cough and has not improved since then.  She has had multiple rounds of antibiotics and steroids, she has had no cough medicine because she is allergic to codeine.  She has a chronic dry cough and a feeling of something in throat day and night.  She never had heartburn even prior to being seen by Dr. Deatra Ina for this cough, but somehow she has remained on PPI for years.  She denies regurgitation or pyrosis.  ROS: Cardiovascular:  no chest pain Respiratory: no dyspnea  The patient's Past Medical, Family and Social History were reviewed and are on file in the EMR.  Objective:  Med list reviewed  Current Outpatient Medications:  .  Cholecalciferol (VITAMIN D PO), Take by mouth., Disp: , Rfl:  .  Dexlansoprazole (DEXILANT) 30 MG capsule, Take 30 mg by mouth daily., Disp: , Rfl:  .  fluconazole (DIFLUCAN) 150 MG tablet, Take 2 tablets (300 mg total) by mouth once a week., Disp:  4 tablet, Rfl: 1 .  FLUoxetine (PROZAC) 20 MG capsule, Take 1 capsule (20 mg total) by mouth daily., Disp: 90 capsule, Rfl: 4 .  HydrOXYzine HCl (ATARAX PO), Take 25 mg by mouth at bedtime. , Disp: , Rfl:  .  levothyroxine (SYNTHROID, LEVOTHROID) 100 MCG tablet, TAKE 1 TABLET BY MOUTH DAILY, Disp: 90 tablet, Rfl: 0 .  Magnesium (GNP MAGNESIUM) 250 MG TABS, Take 2 tablets by mouth daily., Disp: , Rfl:  .  omeprazole (PRILOSEC) 40 MG capsule, TAKE ONE CAPSULE BY MOUTH DAILY, Disp: 90 capsule, Rfl: 0 .  simvastatin (ZOCOR) 20 MG tablet, TAKE 1 TABLET BY MOUTH AT BEDTIME, Disp: 90 tablet, Rfl: 0   Vital signs in last 24 hrs: Vitals:   09/15/17 1556  BP: 118/60  Pulse: 62    Physical Exam  Pleasant and well-appearing woman.  She has a bronchial sounding cough, voice somewhat gravelly.  HEENT: sclera anicteric, oral mucosa moist without lesions  Neck: supple, no thyromegaly, JVD or lymphadenopathy  Cardiac: RRR without murmurs, S1S2 heard, no peripheral edema  Pulm: clear to auscultation bilaterally, normal RR and effort noted  Abdomen: soft, no tenderness, with active bowel sounds. No guarding or palpable hepatosplenomegaly.  Skin; warm and dry, no jaundice or rash  No data for review.  She  reports a chest x-ray has been normal.    @ASSESSMENTPLANBEGIN @ Assessment: Encounter Diagnosis  Name Primary?  Marland Kitchen  Chronic cough Yes    I do not think this is reflux related.  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 I will send a copy of my note to primary care, and I recommended she follow-up with them soon.  If she is unable to tolerate codeine, perhaps a different narcotic cough medicine can be helpful for her, at least so she could get some better rest at night.  Perhaps either a pulmonary consult or a trial of albuterol would be helpful.  Referral to ENT if that fails might help to evaluate any vocal cord lesions.  Lastly,  since it is not clear she is ever had symptomatic reflux, I have asked her to start weaning off the PPI.  She will decrease omeprazole to every other day for the next 10 days, then every third day for 10 days and stop.  Total time 20 minutes, over half spent in counseling and coordination of care.   Nelida Meuse III

## 2017-09-15 NOTE — Patient Instructions (Signed)
If you are age 55 or older, your body mass index should be between 23-30. Your Body mass index is 33.66 kg/m. If this is out of the aforementioned range listed, please consider follow up with your Primary Care Provider.  If you are age 40 or younger, your body mass index should be between 19-25. Your Body mass index is 33.66 kg/m. If this is out of the aformentioned range listed, please consider follow up with your Primary Care Provider.   Thank you for choosing [facility]  Dr Wilfrid Lund III

## 2017-09-16 ENCOUNTER — Encounter: Payer: Self-pay | Admitting: Physician Assistant

## 2017-09-16 ENCOUNTER — Other Ambulatory Visit: Payer: Self-pay | Admitting: Physician Assistant

## 2017-09-16 DIAGNOSIS — R059 Cough, unspecified: Secondary | ICD-10-CM

## 2017-09-16 DIAGNOSIS — R05 Cough: Secondary | ICD-10-CM

## 2017-09-16 MED ORDER — LEVOTHYROXINE SODIUM 100 MCG PO TABS: 100.0000 ug | ORAL_TABLET | Freq: Every day | ORAL | 3 refills | Status: DC

## 2017-09-16 MED ORDER — SIMVASTATIN 20 MG PO TABS: 20.0000 mg | ORAL_TABLET | Freq: Every day | ORAL | 3 refills | Status: DC

## 2017-10-09 ENCOUNTER — Encounter: Payer: Self-pay | Admitting: Gynecology

## 2017-10-09 ENCOUNTER — Ambulatory Visit (INDEPENDENT_AMBULATORY_CARE_PROVIDER_SITE_OTHER): Payer: BLUE CROSS/BLUE SHIELD | Admitting: Gynecology

## 2017-10-09 VITALS — BP 120/74 | Ht 62.0 in | Wt 188.0 lb

## 2017-10-09 DIAGNOSIS — B373 Candidiasis of vulva and vagina: Secondary | ICD-10-CM

## 2017-10-09 DIAGNOSIS — F419 Anxiety disorder, unspecified: Secondary | ICD-10-CM

## 2017-10-09 DIAGNOSIS — N898 Other specified noninflammatory disorders of vagina: Secondary | ICD-10-CM | POA: Diagnosis not present

## 2017-10-09 DIAGNOSIS — Z01411 Encounter for gynecological examination (general) (routine) with abnormal findings: Secondary | ICD-10-CM

## 2017-10-09 DIAGNOSIS — B3731 Acute candidiasis of vulva and vagina: Secondary | ICD-10-CM

## 2017-10-09 LAB — WET PREP FOR TRICH, YEAST, CLUE

## 2017-10-09 MED ORDER — FLUCONAZOLE 150 MG PO TABS: 150.0000 mg | ORAL_TABLET | Freq: Once | ORAL | 0 refills | Status: AC

## 2017-10-09 MED ORDER — FLUOXETINE HCL 20 MG PO CAPS: 20.0000 mg | ORAL_CAPSULE | Freq: Every day | ORAL | 4 refills | Status: DC

## 2017-10-09 NOTE — Progress Notes (Signed)
    Carol Chambers 1961-04-11 [id number]        57 y.o.  G1P0010 for annual gynecologic exam.  Doing well without gynecologic complaints.  Past medical history,surgical history, problem list, medications, allergies, family history and social history were all reviewed and documented as reviewed in the EPIC chart.  ROS:  Performed with pertinent positives and negatives included in the history, assessment and plan.   Additional significant findings : None   Exam: [name] assistant Vitals:   10/09/17 0835  BP: 120/74  Weight: 188 lb (85.3 kg)  Height: 5\' 2"  (1.575 m)   Body mass index is 34.39 kg/m.  General appearance:  Normal affect, orientation and appearance. Skin: Grossly normal HEENT: Without gross lesions.  No cervical or supraclavicular adenopathy. Thyroid normal.  Lungs:  Clear without wheezing, rales or rhonchi Cardiac: RR, without RMG Abdominal:  Soft, nontender, without masses, guarding, rebound, organomegaly or hernia Breasts:  Examined lying and sitting without masses, retractions, discharge or axillary adenopathy. Pelvic:  Ext, BUS, Vagina: Atrophic changes.  Slight frothy white discharge noted  Cervix: Normal  Uterus: Anteverted, normal size, shape and contour, midline and mobile nontender   Adnexa: Without masses or tenderness    Anus and perineum: Normal   Rectovaginal: Normal sphincter tone without palpated masses or tenderness.    Assessment/Plan:  57 y.o. G80P0010 female for annual gynecologic exam.   1. Postmenopausal/mild atrophic changes.  Without significant hot flushes, night sweats, vaginal dryness or any vaginal bleeding. 2. Vaginal discharge.  Slightly heavier discharge noted on exam.  Patient denies any symptoms other than may be a slight discharge but no irritation itching or urinary symptoms such as frequency dysuria urgency.  Wet prep is positive for yeast.  We will treat with Diflucan 150 mg x1 dose.  Patient will follow-up if her discharge  symptoms continue. 3. Anxiety.  Was started on fluoxetine 20 mg and is doing well with this and wants to continue.  No significant side effects or issues with the medication.  Refill times 1 year provided. 4. Mammography coming due in June and I reminded her to schedule this.  Breast exam normal today. 5. Pap smear/HPV 2016.  No Pap smear done today.  No history of abnormal Pap smears.  Plan repeat Pap smear at 5-year interval per current screening guidelines. 6. Colonoscopy 2016.  Repeat at their recommended interval. 7. DEXA never.  Will plan further into the menopause. 8. Health maintenance.  No routine lab work done as patient does this elsewhere.  Follow-up 1 year, sooner as needed.  Additional time in excess of her routine gynecologic exam was spent in direct face to face counseling and coordination of care in regards to her vaginal yeast infection.    Anastasio Auerbach MD, 8:54 AM 10/09/2017

## 2017-10-09 NOTE — Patient Instructions (Signed)
Take the Diflucan pill to treat the vaginal yeast.  Follow-up if the vaginal discharge continues.  Follow-up in 1 year for annual exam

## 2017-10-09 NOTE — Addendum Note (Signed)
Addended by: Nelva Nay on: 10/09/2017 09:30 AM   Modules accepted: Orders

## 2017-10-20 DIAGNOSIS — M65311 Trigger thumb, right thumb: Secondary | ICD-10-CM | POA: Diagnosis not present

## 2017-10-28 ENCOUNTER — Ambulatory Visit
Admission: RE | Admit: 2017-10-28 | Discharge: 2017-10-28 | Disposition: A | Discharge status: Home / Self Care | Payer: BLUE CROSS/BLUE SHIELD | Source: Ambulatory Visit | Attending: Allergy and Immunology | Admitting: Allergy and Immunology

## 2017-10-28 ENCOUNTER — Ambulatory Visit: Payer: BLUE CROSS/BLUE SHIELD | Admitting: Allergy and Immunology

## 2017-10-28 ENCOUNTER — Telehealth: Payer: Self-pay | Admitting: *Deleted

## 2017-10-28 ENCOUNTER — Encounter: Payer: Self-pay | Admitting: Allergy and Immunology

## 2017-10-28 VITALS — BP 114/76 | HR 72 | Temp 98.7°F | Resp 16 | Ht 61.5 in | Wt 189.4 lb

## 2017-10-28 DIAGNOSIS — J4540 Moderate persistent asthma, uncomplicated: Secondary | ICD-10-CM

## 2017-10-28 DIAGNOSIS — J324 Chronic pansinusitis: Secondary | ICD-10-CM

## 2017-10-28 DIAGNOSIS — J3089 Other allergic rhinitis: Secondary | ICD-10-CM | POA: Diagnosis not present

## 2017-10-28 DIAGNOSIS — R05 Cough: Secondary | ICD-10-CM | POA: Diagnosis not present

## 2017-10-28 MED ORDER — MONTELUKAST SODIUM 10 MG PO TABS: 10.0000 mg | ORAL_TABLET | Freq: Every day | ORAL | 5 refills | Status: DC

## 2017-10-28 MED ORDER — BUDESONIDE-FORMOTEROL FUMARATE 160-4.5 MCG/ACT IN AERO: 2.0000 | INHALATION_SPRAY | Freq: Two times a day (BID) | RESPIRATORY_TRACT | 5 refills | Status: DC

## 2017-10-28 MED ORDER — ALBUTEROL SULFATE HFA 108 (90 BASE) MCG/ACT IN AERS: 2.0000 | INHALATION_SPRAY | RESPIRATORY_TRACT | 1 refills | Status: DC | PRN

## 2017-10-28 NOTE — Patient Instructions (Addendum)
  1.  Allergen avoidance measures  2.  Treat and prevent inflammation:   A.  OTC Nasocort/Rhinocort - 1 spray each nostril 2 time per day    B.  Symbicort 160 - 2 inhalations twice a day with spacer  C.  Montelukast 10 mg - 1 tablet 1 time per day   3.  If needed:   A.  Nasal saline multiple times a day  B.  OTC cetirizine 10-20 mg 1 time per day  C.  Pro Air HFA or similar 2 inhalations every 4-6 hours  D.  OTC Mucinex DM 2 tablets twice a day  4.  Obtain a chest x-ray  5.  Obtain a sinus CT scan  6.  Return to clinic in 3 weeks or earlier if problem

## 2017-10-28 NOTE — Progress Notes (Signed)
Dear Carol Chambers,  Thank you for referring Carol Chambers to the [facility] on 10/28/2017.   Below is a summation of this patient's evaluation and recommendations.  Thank you for your referral. I will keep you informed about this patient's response to treatment.   If you have any questions please do not hesitate to contact me.   Sincerely,  Jiles Prows, MD Allergy / Immunology [place]   ______________________________________________________________________    NEW PATIENT NOTE  Referring Provider: Terald Sleeper, PA-C Primary Provider: Theodoro Clock Date of office visit: 10/28/2017    Subjective:   Chief Complaint:  Carol Chambers (DOB: [birth year]) is a 57 y.o. female who presents to the clinic on 10/28/2017 with a chief complaint of Cough (since december 2018) .     HPI: Carol Chambers presents to this clinic in evaluation of cough.  She has a long history of atopic disease requiring immunotherapy as a young girl and then in her adulthood having completing this form of treatment about 15 years ago.  For 15 years she had no problems with her eyes or nose or coughing but unfortunately in December 2018 she developed what appeared to be an episode of "sinusitis" with nasal congestion and green yellow nasal discharge along with unrelenting cough.  She was treated with 2 courses of antibiotics and 2 courses of systemic steroids and although her green nasal discharge has resolved she still has nasal congestion and she now has decreased ability to smell and taste and she is coughing so hard it is disturbing her sleep.  She does have throat coating and she has throat clearing.  She has not developed cough induced micturation and she has not developed posttussive emesis.  There has not really been an obvious environmental change that may account for this issue.  She has not started a new hobby nor  has her house or her work site changed nor is she using any new medications that may be responsible for this issue.  She was empirically treated for reflux induced cough during this timeframe and she visited with a gastroenterologist who told her that she did not have reflux and he tapered her off all of her reflux medications.  She does not have any classic reflux symptoms.  Past Medical History:  Diagnosis Date  . Acid reflux   . Anemia   . History of kidney stones   . Hypercholesteremia   . Hypothyroidism   . PONV (postoperative nausea and vomiting)    with Cholecystectomy  . Seasonal allergies     Past Surgical History:  Procedure Laterality Date  . BRAVO [place] STUDY N/A 08/23/2014   Procedure: BRAVO [place] STUDY;  Surgeon: Inda Castle, MD;  Location: [facility];  Service: Endoscopy;  Laterality: N/A;  48 hour bravo pH study with impedance plethysmography   . BRAVO [place] STUDY N/A 10/10/2014   Procedure: BRAVO [place] STUDY;  Surgeon: Inda Castle, MD;  Location: [facility];  Service: Endoscopy;  Laterality: N/A;  2nd attempt  . CARPAL TUNNEL RELEASE  2003   bilat  . ESOPHAGOGASTRODUODENOSCOPY (EGD) WITH PROPOFOL N/A 08/23/2014   Procedure: ESOPHAGOGASTRODUODENOSCOPY (EGD) WITH PROPOFOL;  Surgeon: Inda Castle, MD;  Location: [facility];  Service: Endoscopy;  Laterality: N/A;  . ESOPHAGOGASTRODUODENOSCOPY (EGD) WITH PROPOFOL N/A 10/10/2014   Procedure: ESOPHAGOGASTRODUODENOSCOPY (EGD) WITH PROPOFOL;  Surgeon: Inda Castle, MD;  Location:  [facility];  Service: Endoscopy;  Laterality: N/A;  . Esophagus stretched     X 2  . FOOT SURGERY     03-24-14 Plantar Fasciatitis  . FOOT SURGERY Left    foot surgery 09-29-14  . groin cyst removed  2000   right  . LAPAROSCOPIC CHOLECYSTECTOMY  2003    Allergies as of 10/28/2017      Reactions   Sulfa Antibiotics Hives   Tequin Swelling   Swelling of tongue   Codeine Itching   Vicodin [hydrocodone-acetaminophen] Other (See Comments)     Hallucinations      Medication List      ATARAX PO Take 25 mg by mouth at bedtime.   FLUoxetine 20 MG capsule Commonly known as:  PROZAC Take 1 capsule (20 mg total) by mouth daily.   fluticasone 50 MCG/ACT nasal spray Commonly known as:  FLONASE Place 1 spray into both nostrils daily as needed for allergies or rhinitis.   GNP MAGNESIUM 250 MG Tabs Generic drug:  Magnesium Take 2 tablets by mouth daily.   levothyroxine 100 MCG tablet Commonly known as:  SYNTHROID, LEVOTHROID Take 1 tablet (100 mcg total) by mouth daily.   RESTASIS MULTIDOSE 0.05 % ophthalmic emulsion Generic drug:  cycloSPORINE   simvastatin 20 MG tablet Commonly known as:  ZOCOR Take 1 tablet (20 mg total) by mouth at bedtime.   VITAMIN D PO Take by mouth.       Review of systems negative except as noted in HPI / PMHx or noted below:  Review of Systems  Constitutional: Negative.   HENT: Negative.   Eyes: Negative.   Respiratory: Negative.   Cardiovascular: Negative.   Gastrointestinal: Negative.   Genitourinary: Negative.   Musculoskeletal: Negative.   Skin: Negative.   Neurological: Negative.   Endo/Heme/Allergies: Negative.   Psychiatric/Behavioral: Negative.     Family History  Problem Relation Age of Onset  . Cancer Mother 58       Uterine  . Cancer Father        Lung  . Diabetes Maternal Grandfather   . Heart disease Maternal Grandfather   . Breast cancer Maternal Grandmother        Age 61's  . Colon cancer Neg Hx   . Esophageal cancer Neg Hx   . Rectal cancer Neg Hx   . Stomach cancer Neg Hx   . Allergic rhinitis Neg Hx   . Asthma Neg Hx   . Eczema Neg Hx   . Urticaria Neg Hx   . Angioedema Neg Hx     Social History   Socioeconomic History  . Marital status: Legally Separated    Spouse name: Not on file  . Number of children: Not on file  . Years of education: Not on file  . Highest education level: Not on file  Social Needs  . Financial resource strain:  Not on file  . Food insecurity - worry: Not on file  . Food insecurity - inability: Not on file  . Transportation needs - medical: Not on file  . Transportation needs - non-medical: Not on file  Occupational History  . Not on file  Tobacco Use  . Smoking status: Never Smoker  . Smokeless tobacco: Never Used  Substance and Sexual Activity  . Alcohol use: Yes    Alcohol/week: 0.0 oz    Comment: Rare  . Drug use: No  . Sexual activity: Yes    Birth control/protection: Post-menopausal    Comment: 1st intercourse 12  yo-5 partners  Other Topics Concern  . Not on file  Social History Narrative  . Not on file    Environmental and Social history  Lives in a house with a dry environment, a dog located inside the household, no carpet in the bedroom, plastic on the bed, plastic on the pillow, and no smokers located inside the household.  She works in an office setting.  Objective:   Vitals:   10/28/17 1349  BP: 114/76  Pulse: 72  Resp: 16  Temp: 98.7 F (37.1 C)   Height: 5' 1.5" (156.2 cm) Weight: 189 lb 6.4 oz (85.9 kg)  Physical Exam  Constitutional: She is well-developed, well-nourished, and in no distress.  Nasal voice, throat clearing, coughing  HENT:  Head: Normocephalic. Head is without right periorbital erythema and without left periorbital erythema.  Right Ear: Tympanic membrane, external ear and ear canal normal.  Left Ear: Tympanic membrane, external ear and ear canal normal.  Nose: Mucosal edema present. No rhinorrhea.  Mouth/Throat: Uvula is midline, oropharynx is clear and moist and mucous membranes are normal. No oropharyngeal exudate.  Eyes: Conjunctivae and lids are normal. Pupils are equal, round, and reactive to light.  Neck: Trachea normal. No tracheal tenderness present. No tracheal deviation present. No thyromegaly present.  Cardiovascular: Normal rate, regular rhythm, S1 normal, S2 normal and normal heart sounds.  No murmur heard. Pulmonary/Chest:  Effort normal and breath sounds normal. No stridor. No tachypnea. No respiratory distress. She has no wheezes. She has no rales. She exhibits no tenderness.  Abdominal: Soft. She exhibits no distension and no mass. There is no hepatosplenomegaly. There is no tenderness. There is no rebound and no guarding.  Musculoskeletal: She exhibits no edema or tenderness.  Lymphadenopathy:       Head (right side): No tonsillar adenopathy present.       Head (left side): No tonsillar adenopathy present.    She has no cervical adenopathy.    She has no axillary adenopathy.  Neurological: She is alert. Gait normal.  Skin: No rash noted. She is not diaphoretic. No erythema. No pallor. Nails show no clubbing.  Psychiatric: Mood and affect normal.    Diagnostics: Allergy skin tests were performed.  She demonstrated hypersensitivity to grasses and weeds.  Spirometry was performed and demonstrated an FEV1 of 2.20 @ 89 % of predicted. FEV1/FVC = 0.79.  Following the administration of nebulized albuterol her FEV1 rose to 2.31 which calculated out to an increase in the FEV1 of 5%.  Assessment and Plan:    1. Asthma, moderate persistent, well-controlled   2. Other allergic rhinitis   3. Chronic pansinusitis     1.  Allergen avoidance measures  2.  Treat and prevent inflammation:   A.  OTC Nasocort/Rhinocort - 1 spray each nostril 2 time per day    B.  Symbicort 160 - 2 inhalations twice a day with spacer  C.  Montelukast 10 mg - 1 tablet 1 time per day   3.  If needed:   A.  Nasal saline multiple times a day  B.  OTC cetirizine 10-20 mg 1 time per day  C.  Pro Air HFA or similar 2 inhalations every 4-6 hours  D.  OTC Mucinex DM 2 tablets twice a day  4.  Obtain a chest x-ray  5.  Obtain a sinus CT scan  6.  Return to clinic in 3 weeks or earlier if problem  Yuritzy appears to have an inflamed airway and as  well I suspect that she is chronically infected in her upper airway and we will treat her  with anti-inflammatory medications for her entire respiratory tract while we further evaluate the possibility of chronic sinusitis and other abnormalities of her lower respiratory tract with imaging procedure as noted above.  I will hold off on any further evaluation for reflux induced respiratory disease at this point but there may actually be a component of that condition that arose secondary to some of her other issues.  I will regroup with her in 3 weeks to make a decision about how to proceed pending her response and the results of her studies.  Jiles Prows, MD Allergy / Immunology [facility] [place]

## 2017-10-28 NOTE — Telephone Encounter (Signed)
CT scan appointment made Monday at [facility] 11/03/17 @ 6 pm check in @ 5:45p patient aware. Will work on [place] on later date

## 2017-10-29 ENCOUNTER — Encounter: Payer: Self-pay | Admitting: Allergy and Immunology

## 2017-10-29 NOTE — Telephone Encounter (Signed)
Spoke to [employer] at [facility] for precert for CT scan states patient is not participating in diagnostic test plan so patient does not need a precert for this service.

## 2017-10-30 ENCOUNTER — Encounter: Payer: Self-pay | Admitting: Allergy and Immunology

## 2017-10-31 ENCOUNTER — Ambulatory Visit (HOSPITAL_COMMUNITY)
Admission: RE | Admit: 2017-10-31 | Discharge: 2017-10-31 | Disposition: A | Discharge status: Home / Self Care | Payer: BLUE CROSS/BLUE SHIELD | Source: Ambulatory Visit | Attending: Allergy and Immunology | Admitting: Allergy and Immunology

## 2017-10-31 DIAGNOSIS — J3489 Other specified disorders of nose and nasal sinuses: Secondary | ICD-10-CM | POA: Insufficient documentation

## 2017-10-31 DIAGNOSIS — R930 Abnormal findings on diagnostic imaging of skull and head, not elsewhere classified: Secondary | ICD-10-CM | POA: Diagnosis not present

## 2017-10-31 DIAGNOSIS — J329 Chronic sinusitis, unspecified: Secondary | ICD-10-CM | POA: Diagnosis not present

## 2017-10-31 DIAGNOSIS — J324 Chronic pansinusitis: Secondary | ICD-10-CM

## 2017-11-03 ENCOUNTER — Telehealth: Payer: Self-pay | Admitting: *Deleted

## 2017-11-03 ENCOUNTER — Ambulatory Visit (HOSPITAL_COMMUNITY): Payer: BLUE CROSS/BLUE SHIELD

## 2017-11-03 MED ORDER — CLINDAMYCIN HCL 150 MG PO CAPS: 150.0000 mg | ORAL_CAPSULE | Freq: Three times a day (TID) | ORAL | 0 refills | Status: AC

## 2017-11-03 MED ORDER — PREDNISONE 10 MG PO TABS: 10.0000 mg | ORAL_TABLET | Freq: Every day | ORAL | 0 refills | Status: DC

## 2017-11-03 NOTE — Telephone Encounter (Signed)
Patient returned call regarding CT results showed chronic sinusitis rx for Clindamycin and Prednisone sent to [facility]

## 2017-11-04 ENCOUNTER — Other Ambulatory Visit: Payer: Self-pay | Admitting: Physician Assistant

## 2017-11-10 ENCOUNTER — Encounter: Payer: Self-pay | Admitting: Gynecology

## 2017-11-10 ENCOUNTER — Other Ambulatory Visit: Payer: Self-pay | Admitting: Gynecology

## 2017-11-10 MED ORDER — FLUCONAZOLE 150 MG PO TABS: ORAL_TABLET | ORAL | 0 refills | Status: DC

## 2017-11-10 NOTE — Telephone Encounter (Signed)
Okay for Diflucan 150 mg.  I would take 1 now and 1 at the end of her course of antibiotics.  Dispense #2

## 2017-11-11 ENCOUNTER — Encounter: Payer: Self-pay | Admitting: Physician Assistant

## 2017-11-11 ENCOUNTER — Other Ambulatory Visit: Payer: Self-pay | Admitting: Physician Assistant

## 2017-11-11 MED ORDER — PHENTERMINE HCL 37.5 MG PO CAPS: 37.5000 mg | ORAL_CAPSULE | ORAL | 2 refills | Status: DC

## 2017-11-19 ENCOUNTER — Encounter: Payer: Self-pay | Admitting: Allergy and Immunology

## 2017-11-19 ENCOUNTER — Ambulatory Visit: Payer: BLUE CROSS/BLUE SHIELD | Admitting: Allergy and Immunology

## 2017-11-19 VITALS — BP 110/76 | HR 68 | Temp 98.8°F | Resp 16 | Ht 61.89 in | Wt 192.2 lb

## 2017-11-19 DIAGNOSIS — J324 Chronic pansinusitis: Secondary | ICD-10-CM

## 2017-11-19 DIAGNOSIS — J4540 Moderate persistent asthma, uncomplicated: Secondary | ICD-10-CM | POA: Diagnosis not present

## 2017-11-19 DIAGNOSIS — J3089 Other allergic rhinitis: Secondary | ICD-10-CM | POA: Diagnosis not present

## 2017-11-19 NOTE — Progress Notes (Signed)
Follow-up Note  Referring Provider: Terald Sleeper, PA-C Primary Provider: Theodoro Clock Date of Office Visit: 11/19/2017  Subjective:   Carol Chambers (DOB: 12/17/1960) is a 57 y.o. female who returns to the [facility] on 11/19/2017 in re-evaluation of the following:  HPI: Carol Chambers returns to this clinic in reevaluation of her persistent cough addressed during her initial evaluation of 28 October 2017.  She has no cough.  She has very little issues with her upper airway.  She has no need to use a short acting bronchodilator.  She has not been having any reflux or throat issues.  Overall she feels great.  She continues on all medications prescribed during her last visit plus clindamycin for her CT scan documented chronic sinusitis.  Allergies as of 11/19/2017      Reactions   Sulfa Antibiotics Hives   Tequin Swelling   Swelling of tongue   Codeine Itching   Vicodin [hydrocodone-acetaminophen] Other (See Comments)   Hallucinations      Medication List      albuterol 108 (90 Base) MCG/ACT inhaler Commonly known as:  PROAIR HFA Inhale 2 puffs into the lungs every 4 (four) hours as needed for wheezing or shortness of breath.   ATARAX PO Take 25 mg by mouth at bedtime.   budesonide-formoterol 160-4.5 MCG/ACT inhaler Commonly known as:  SYMBICORT Inhale 2 puffs into the lungs 2 (two) times daily.   clindamycin 150 MG capsule Commonly known as:  CLEOCIN Take 1 capsule (150 mg total) by mouth 3 (three) times daily for 28 days.   fluconazole 150 MG tablet Commonly known as:  DIFLUCAN Take one tab now and take one tab at end of course of antibiotics.   FLUoxetine 20 MG capsule Commonly known as:  PROZAC Take 1 capsule (20 mg total) by mouth daily.   GNP MAGNESIUM 250 MG Tabs Generic drug:  Magnesium Take 2 tablets by mouth daily.   levothyroxine 100 MCG tablet Commonly known as:  SYNTHROID, LEVOTHROID Take 1 tablet (100 mcg total) by mouth daily.     montelukast 10 MG tablet Commonly known as:  SINGULAIR Take 1 tablet (10 mg total) by mouth at bedtime.   NASACORT AQ NA Place 1 spray into the nose 2 (two) times daily.   phentermine 37.5 MG capsule Take 1 capsule (37.5 mg total) by mouth every morning.   RESTASIS MULTIDOSE 0.05 % ophthalmic emulsion Generic drug:  cycloSPORINE   simvastatin 20 MG tablet Commonly known as:  ZOCOR Take 1 tablet (20 mg total) by mouth at bedtime.   VITAMIN D PO Take by mouth.       Past Medical History:  Diagnosis Date  . Acid reflux   . Anemia   . History of kidney stones   . Hypercholesteremia   . Hypothyroidism   . PONV (postoperative nausea and vomiting)    with Cholecystectomy  . Seasonal allergies     Past Surgical History:  Procedure Laterality Date  . BRAVO [place] STUDY N/A 08/23/2014   Procedure: BRAVO [place] STUDY;  Surgeon: Inda Castle, MD;  Location: [facility];  Service: Endoscopy;  Laterality: N/A;  48 hour bravo pH study with impedance plethysmography   . BRAVO [place] STUDY N/A 10/10/2014   Procedure: BRAVO [place] STUDY;  Surgeon: Inda Castle, MD;  [facility]: [facility];  Service: Endoscopy;  Laterality: N/A;  2nd attempt  . CARPAL TUNNEL RELEASE  2003   bilat  . ESOPHAGOGASTRODUODENOSCOPY (  EGD) WITH PROPOFOL N/A 08/23/2014   Procedure: ESOPHAGOGASTRODUODENOSCOPY (EGD) WITH PROPOFOL;  Surgeon: Inda Castle, MD;  [facility];  Service: Endoscopy;  Laterality: N/A;  . ESOPHAGOGASTRODUODENOSCOPY (EGD) WITH PROPOFOL N/A 10/10/2014   Procedure: ESOPHAGOGASTRODUODENOSCOPY (EGD) WITH PROPOFOL;  Surgeon: Inda Castle, MD;  [facility];  Service: Endoscopy;  Laterality: N/A;  . Esophagus stretched     X 2  . FOOT SURGERY     03-24-14 Plantar Fasciatitis  . FOOT SURGERY Left    foot surgery 09-29-14  . groin cyst removed  2000   right  . LAPAROSCOPIC CHOLECYSTECTOMY  2003    Review of systems negative except as noted in HPI / PMHx or noted  below:  Review of Systems  Constitutional: Negative.   HENT: Negative.   Eyes: Negative.   Respiratory: Negative.   Cardiovascular: Negative.   Gastrointestinal: Negative.   Genitourinary: Negative.   Musculoskeletal: Negative.   Skin: Negative.   Neurological: Negative.   Endo/Heme/Allergies: Negative.   Psychiatric/Behavioral: Negative.      Objective:   Vitals:   11/19/17 1108  BP: 110/76  Pulse: 68  Resp: 16  Temp: 98.8 F (37.1 C)   Height: 5' 1.89" (157.2 cm)  Weight: 192 lb 3.2 oz (87.2 kg)   Physical Exam  HENT:  Head: Normocephalic.  Right Ear: Tympanic membrane, external ear and ear canal normal.  Left Ear: Tympanic membrane, external ear and ear canal normal.  Nose: Nose normal. No mucosal edema or rhinorrhea.  Mouth/Throat: Uvula is midline, oropharynx is clear and moist and mucous membranes are normal. No oropharyngeal exudate.  Eyes: Conjunctivae are normal.  Neck: Trachea normal. No tracheal tenderness present. No tracheal deviation present. No thyromegaly present.  Cardiovascular: Normal rate, regular rhythm, S1 normal, S2 normal and normal heart sounds.  No murmur heard. Pulmonary/Chest: Breath sounds normal. No stridor. No respiratory distress. She has no wheezes. She has no rales.  Musculoskeletal: She exhibits no edema.  Lymphadenopathy:       Head (right side): No tonsillar adenopathy present.       Head (left side): No tonsillar adenopathy present.    She has no cervical adenopathy.  Neurological: She is alert.  Skin: No rash noted. She is not diaphoretic. No erythema. Nails show no clubbing.    Diagnostics:    Spirometry was performed and demonstrated an FEV1 of 2.35 at 96 % of predicted.  The patient had an Asthma Control Test with the following results: ACT Total Score: 21.    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  The ostiomeatal complex is obstructed. A fluid level is present with near complete opacification of the right maxillary sinus. Anterior right ethmoid mucosal thickening is present. The frontal sinuses are hypoplastic without focal disease. Sphenoid sinuses are clear. The mastoid air cells are clear bilaterally. Nasal cavity is clear. Rightward nasal septal spurring extends 7 mm from the midline and distorts the right inferior [place].  Results of a chest x-ray obtained 28 October 2017 identified the following:  The lungs are adequately inflated. There is no focal infiltrate. There is linear density projecting in the lower retrocardiac region on the lateral view which is new and may reflect atelectasis. It is difficult to judge whether this is on the right or left. The heart and pulmonary vascularity are normal. The mediastinum is normal in width. The trachea is midline. There  is no pleural effusion. The bony thorax is unremarkable.  Assessment and Plan:   1. Asthma, moderate persistent, well-controlled   2. Other allergic rhinitis   3. Chronic pansinusitis     1.  Continue to Allergen avoidance measures  2.  Continue to Treat and prevent inflammation:   A.  OTC Nasocort/Rhinocort - 1 spray each nostril 2 time per day    B.  Symbicort 160 - 2 inhalations twice a day with spacer  C.  Montelukast 10 mg - 1 tablet 1 time per day   3.  If needed:   A.  Nasal saline multiple times a day  B.  OTC cetirizine 10-20 mg 1 time per day  C.  Pro Air HFA or similar 2 inhalations every 4-6 hours  D.  OTC Mucinex DM 2 tablets twice a day  4. Continue Clindamycin for a full 4 weeks  5. Return in 8 weeks - Taper?  Emaley appears to have significant improvement with her current plan which includes anti-inflammatory medications for her airway and prolonged broad-spectrum antibiotics to address the infectious component of her condition.  I would like for her to stay on this plan for a full 12  weeks.  I will see her back in this clinic in 8 weeks and at that point in time there will probably be an opportunity to consolidate her medical treatment.  Allena Katz, MD Allergy / Immunology [place]

## 2017-11-19 NOTE — Patient Instructions (Addendum)
  1.  Continue to Allergen avoidance measures  2.  Continue to Treat and prevent inflammation:   A.  OTC Nasocort/Rhinocort - 1 spray each nostril 2 time per day    B.  Symbicort 160 - 2 inhalations twice a day with spacer  C.  Montelukast 10 mg - 1 tablet 1 time per day   3.  If needed:   A.  Nasal saline multiple times a day  B.  OTC cetirizine 10-20 mg 1 time per day  C.  Pro Air HFA or similar 2 inhalations every 4-6 hours  D.  OTC Mucinex DM 2 tablets twice a day  4. Continue Clindamycin for a full 4 weeks  5. Return in 8 weeks - Taper?

## 2018-01-04 ENCOUNTER — Other Ambulatory Visit: Payer: Self-pay | Admitting: Physician Assistant

## 2018-01-06 NOTE — Telephone Encounter (Signed)
Last lipid 10/04/16

## 2018-01-20 ENCOUNTER — Encounter: Payer: Self-pay | Admitting: Gynecology

## 2018-01-20 MED ORDER — FLUOXETINE HCL 20 MG PO CAPS: 20.0000 mg | ORAL_CAPSULE | Freq: Every day | ORAL | 3 refills | Status: DC

## 2018-01-20 MED ORDER — FLUOXETINE HCL 10 MG PO CAPS: 10.0000 mg | ORAL_CAPSULE | Freq: Every day | ORAL | 3 refills | Status: DC

## 2018-01-20 MED ORDER — FLUOXETINE HCL 10 MG PO CAPS: 10.0000 mg | ORAL_CAPSULE | Freq: Every day | ORAL | 0 refills | Status: DC

## 2018-01-20 NOTE — Addendum Note (Signed)
Addended by: Thamas Jaegers on: 01/20/2018 03:23 PM   Modules accepted: Orders

## 2018-01-20 NOTE — Telephone Encounter (Signed)
Okay to try increasing the Prozac.  Unfortunately they do not make a 30 mg tablet.  I hate to jump to the 40.  I would recommend taking a 20 mg Prozac and a 10 mg Prozac daily.  Can prescribe a 90-day supply however she needs this done.

## 2018-01-21 ENCOUNTER — Encounter: Payer: Self-pay | Admitting: Allergy and Immunology

## 2018-01-21 ENCOUNTER — Ambulatory Visit: Payer: BLUE CROSS/BLUE SHIELD | Admitting: Allergy and Immunology

## 2018-01-21 VITALS — BP 114/78 | HR 80 | Resp 16

## 2018-01-21 DIAGNOSIS — J3089 Other allergic rhinitis: Secondary | ICD-10-CM

## 2018-01-21 DIAGNOSIS — J324 Chronic pansinusitis: Secondary | ICD-10-CM | POA: Diagnosis not present

## 2018-01-21 DIAGNOSIS — J4540 Moderate persistent asthma, uncomplicated: Secondary | ICD-10-CM

## 2018-01-21 MED ORDER — MONTELUKAST SODIUM 10 MG PO TABS: 10.0000 mg | ORAL_TABLET | Freq: Every day | ORAL | 3 refills | Status: DC

## 2018-01-21 NOTE — Patient Instructions (Signed)
  1.  Continue to Allergen avoidance measures  2.  Continue to Treat and prevent inflammation:   A.  DECREASE OTC Nasocort - 1 spray each nostril 1 time per day    B.  DECREASE Symbicort 160 - 2 inhalations 1 time per day with spacer  C.  Montelukast 10 mg - 1 tablet 1 time per day   3.  If needed:   A.  Nasal saline multiple times a day  B.  OTC cetirizine 10-20 mg 1 time per day  C.  Pro Air HFA or similar 2 inhalations every 4-6 hours  D.  OTC Mucinex DM 2 tablets twice a day  4. Return in 8 weeks - Taper?

## 2018-01-21 NOTE — Progress Notes (Signed)
Follow-up Note  Referring Provider: Terald Sleeper, PA-C Primary Provider: Theodoro Clock Date of Office Visit: 01/21/2018  Subjective:   Carol Chambers (DOB: 09-12-1960) is a 57 y.o. female who returns to the [facility] on 01/21/2018 in re-evaluation of the following:  HPI: [name] returns to this clinic in reevaluation of her chronic sinusitis and allergic rhinitis and asthma.  Her last visit to this clinic was 19 November 2017.  She has continued to do very well without any significant respiratory tract symptoms involving either her upper or lower airway and she has no need to use a short acting bronchodilator and can smell and taste without any difficulty while consistently using a nasal steroid, combination inhaler, and a leukotriene modifier.  She has completed her prolonged 4-week course of clindamycin for her chronic sinusitis.  Allergies as of 01/21/2018      Reactions   Sulfa Antibiotics Hives   Tequin Swelling   Swelling of tongue   Codeine Itching   Vicodin [hydrocodone-acetaminophen] Other (See Comments)   Hallucinations      Medication List      albuterol 108 (90 Base) MCG/ACT inhaler Commonly known as:  PROAIR HFA Inhale 2 puffs into the lungs every 4 (four) hours as needed for wheezing or shortness of breath.   ATARAX PO Take 25 mg by mouth at bedtime.   budesonide-formoterol 160-4.5 MCG/ACT inhaler Commonly known as:  SYMBICORT Inhale 2 puffs into the lungs 2 (two) times daily.   fluconazole 150 MG tablet Commonly known as:  DIFLUCAN Take one tab now and take one tab at end of course of antibiotics.   FLUoxetine 10 MG capsule Commonly known as:  PROZAC Take 1 capsule (10 mg total) by mouth daily.   FLUoxetine 20 MG capsule Commonly known as:  PROZAC Take 1 capsule (20 mg total) by mouth daily.   FLUoxetine 10 MG capsule Commonly known as:  PROZAC Take 1 capsule (10 mg total) by mouth daily.   GNP MAGNESIUM 250 MG Tabs Generic  drug:  Magnesium Take 2 tablets by mouth daily.   levothyroxine 100 MCG tablet Commonly known as:  SYNTHROID, LEVOTHROID Take 1 tablet (100 mcg total) by mouth daily.   montelukast 10 MG tablet Commonly known as:  SINGULAIR Take 1 tablet (10 mg total) by mouth at bedtime.   NASACORT AQ NA Place 1 spray into the nose 2 (two) times daily.   phentermine 37.5 MG capsule Take 1 capsule (37.5 mg total) by mouth every morning.   RESTASIS MULTIDOSE 0.05 % ophthalmic emulsion Generic drug:  cycloSPORINE   simvastatin 20 MG tablet Commonly known as:  ZOCOR TAKE 1 TABLET BY MOUTH AT BEDTIME   VITAMIN D PO Take by mouth.       Past Medical History:  Diagnosis Date  . Acid reflux   . Anemia   . History of kidney stones   . Hypercholesteremia   . Hypothyroidism   . PONV (postoperative nausea and vomiting)    with Cholecystectomy  . Seasonal allergies     Past Surgical History:  Procedure Laterality Date  . BRAVO Jay STUDY N/A 08/23/2014   Procedure: BRAVO Olivia STUDY;  Surgeon: Inda Castle, MD;  Location: [facility];  Service: Endoscopy;  Laterality: N/A;  48 hour bravo pH study with impedance plethysmography   . BRAVO [place] STUDY N/A 10/10/2014   Procedure: BRAVO [place] STUDY;  Surgeon: Inda Castle, MD;  Location: [facility];  Service: Endoscopy;  Laterality: N/A;  2nd attempt  . CARPAL TUNNEL RELEASE  2003   bilat  . ESOPHAGOGASTRODUODENOSCOPY (EGD) WITH PROPOFOL N/A 08/23/2014   Procedure: ESOPHAGOGASTRODUODENOSCOPY (EGD) WITH PROPOFOL;  Surgeon: Inda Castle, MD;  Location: [facility];  Service: Endoscopy;  Laterality: N/A;  . ESOPHAGOGASTRODUODENOSCOPY (EGD) WITH PROPOFOL N/A 10/10/2014   Procedure: ESOPHAGOGASTRODUODENOSCOPY (EGD) WITH PROPOFOL;  Surgeon: Inda Castle, MD;  Location: [facility];  Service: Endoscopy;  Laterality: N/A;  . Esophagus stretched     X 2  . FOOT SURGERY     03-24-14 Plantar Fasciatitis  . FOOT SURGERY Left    foot surgery 09-29-14  .  groin cyst removed  2000   right  . LAPAROSCOPIC CHOLECYSTECTOMY  2003    Review of systems negative except as noted in HPI / PMHx or noted below:  Review of Systems  Constitutional: Negative.   HENT: Negative.   Eyes: Negative.   Respiratory: Negative.   Cardiovascular: Negative.   Gastrointestinal: Negative.   Genitourinary: Negative.   Musculoskeletal: Negative.   Skin: Negative.   Neurological: Negative.   Endo/Heme/Allergies: Negative.   Psychiatric/Behavioral: Negative.      Objective:   Vitals:   01/21/18 1107  BP: 114/78  Pulse: 80  Resp: 16          Physical Exam  HENT:  Head: Normocephalic.  Right Ear: Tympanic membrane, external ear and ear canal normal.  Left Ear: Tympanic membrane, external ear and ear canal normal.  Nose: Nose normal. No mucosal edema or rhinorrhea.  Mouth/Throat: Uvula is midline, oropharynx is clear and moist and mucous membranes are normal. No oropharyngeal exudate.  Eyes: Conjunctivae are normal.  Neck: Trachea normal. No tracheal tenderness present. No tracheal deviation present. No thyromegaly present.  Cardiovascular: Normal rate, regular rhythm, S1 normal, S2 normal and normal heart sounds.  No murmur heard. Pulmonary/Chest: Breath sounds normal. No stridor. No respiratory distress. She has no wheezes. She has no rales.  Musculoskeletal: She exhibits no edema.  Lymphadenopathy:       Head (right side): No tonsillar adenopathy present.       Head (left side): No tonsillar adenopathy present.    She has no cervical adenopathy.  Neurological: She is alert.  Skin: No rash noted. She is not diaphoretic. No erythema. Nails show no clubbing.    Diagnostics:    Spirometry was performed and demonstrated an FEV1 of 2.35 at 96 % of predicted.  The patient had an Asthma Control Test with the following results: ACT Total Score: 23.    Assessment and Plan:   1. Asthma, moderate persistent, well-controlled   2. Other allergic  rhinitis   3. Chronic pansinusitis     1.  Continue to Allergen avoidance measures  2.  Continue to Treat and prevent inflammation:   A.  DECREASE OTC Nasocort - 1 spray each nostril 1 time per day    B.  DECREASE Symbicort 160 - 2 inhalations 1 time per day with spacer  C.  Montelukast 10 mg - 1 tablet 1 time per day   3.  If needed:   A.  Nasal saline multiple times a day  B.  OTC cetirizine 10-20 mg 1 time per day  C.  Pro Air HFA or similar 2 inhalations every 4-6 hours  D.  OTC Mucinex DM 2 tablets twice a day  4. Return in 8 weeks - Taper?  Artelia has really done very well with aggressive therapy directed against  respiratory tract inflammation and chronic infection and we will now see if we can consolidate her therapy as noted above.  I will see her back in this clinic in 8 weeks and there may be an opportunity with that visit to further consolidate her treatment.  Allena Katz, MD Allergy / Immunology [place]

## 2018-01-22 ENCOUNTER — Encounter: Payer: Self-pay | Admitting: Allergy and Immunology

## 2018-01-23 ENCOUNTER — Encounter: Payer: Self-pay | Admitting: Gynecology

## 2018-01-23 DIAGNOSIS — Z1231 Encounter for screening mammogram for malignant neoplasm of breast: Secondary | ICD-10-CM | POA: Diagnosis not present

## 2018-01-30 ENCOUNTER — Other Ambulatory Visit: Payer: Self-pay | Admitting: Gynecology

## 2018-01-30 ENCOUNTER — Encounter: Payer: Self-pay | Admitting: Gynecology

## 2018-01-30 MED ORDER — FLUCONAZOLE 150 MG PO TABS: 150.0000 mg | ORAL_TABLET | Freq: Once | ORAL | 0 refills | Status: AC

## 2018-01-30 NOTE — Telephone Encounter (Signed)
Dr. Loetta Rough- At [facility] you wrote "Vaginal discharge.  Slightly heavier discharge noted on exam.  Patient denies any symptoms other than may be a slight discharge but no irritation itching or urinary symptoms such as frequency dysuria urgency.  Wet prep is positive for yeast.  We will treat with Diflucan 150 mg x1 dose.  Patient will follow-up if her discharge symptoms continue."

## 2018-01-30 NOTE — Telephone Encounter (Signed)
Recommend Diflucan 150 mg x 1 dose.

## 2018-01-30 NOTE — Telephone Encounter (Signed)
I gave her Diflucan at her annual.  Did it get better after the Diflucan and then returned?

## 2018-03-03 ENCOUNTER — Ambulatory Visit (INDEPENDENT_AMBULATORY_CARE_PROVIDER_SITE_OTHER): Payer: BLUE CROSS/BLUE SHIELD

## 2018-03-03 ENCOUNTER — Ambulatory Visit: Payer: BLUE CROSS/BLUE SHIELD | Admitting: Physician Assistant

## 2018-03-03 ENCOUNTER — Other Ambulatory Visit: Payer: Self-pay | Admitting: Physician Assistant

## 2018-03-03 ENCOUNTER — Encounter: Payer: Self-pay | Admitting: Physician Assistant

## 2018-03-03 VITALS — BP 119/77 | HR 88 | Temp 99.3°F | Ht 61.89 in | Wt 185.0 lb

## 2018-03-03 DIAGNOSIS — W19XXXA Unspecified fall, initial encounter: Secondary | ICD-10-CM

## 2018-03-03 DIAGNOSIS — S52122A Displaced fracture of head of left radius, initial encounter for closed fracture: Secondary | ICD-10-CM | POA: Diagnosis not present

## 2018-03-03 DIAGNOSIS — S52125A Nondisplaced fracture of head of left radius, initial encounter for closed fracture: Secondary | ICD-10-CM | POA: Diagnosis not present

## 2018-03-03 DIAGNOSIS — S42402A Unspecified fracture of lower end of left humerus, initial encounter for closed fracture: Secondary | ICD-10-CM | POA: Diagnosis not present

## 2018-03-03 DIAGNOSIS — S4992XA Unspecified injury of left shoulder and upper arm, initial encounter: Secondary | ICD-10-CM

## 2018-03-03 DIAGNOSIS — M25522 Pain in left elbow: Secondary | ICD-10-CM | POA: Diagnosis not present

## 2018-03-03 MED ORDER — CYCLOBENZAPRINE HCL 10 MG PO TABS: 10.0000 mg | ORAL_TABLET | Freq: Three times a day (TID) | ORAL | 0 refills | Status: DC | PRN

## 2018-03-03 NOTE — Patient Instructions (Signed)
Radial Head Elbow Fracture Rehab Ask your health care provider which exercises are safe for you. Do exercises exactly as told by your health care provider and adjust them as directed. It is normal to feel mild stretching, pulling, tightness, or discomfort as you do these exercises, but you should stop right away if you feel sudden pain or your pain gets worse. Do not begin these exercises until told by your health care provider. Range of motion exercises These exercises warm up your muscles and joints and improve the movement and flexibility of your injured elbow. These exercises also help to relieve pain, numbness, and tingling. These exercises are done using the muscles in your injured elbow. Exercise A: Elbow flexion, active 1. Hold your left / right arm at your side, and bend your elbow as far as you can using your left / right arm muscles. 2. Hold this position for __________ seconds. 3. Slowly return to the starting position. Repeat __________ times. Complete this exercise __________ times a day. Exercise B: Elbow extension, active 1. Hold your left / right arm at your side, and straighten your elbow as much as you can using your left / right arm muscles. 2. Hold this position for __________ seconds. 3. Slowly return to the starting position. Repeat __________ times. Complete this exercise __________ times a day. Exercise C: Forearm rotation, supination, active 1. Stand or sit with your elbows at your sides. 2. Bend your left / right elbow to an "L" shape (90 degrees). 3. Turn your palm upward until you feel a gentle stretch on the inside of your forearm. 4. Hold this position for __________ seconds. 5. Slowly release, and return to the starting position. Repeat __________ times. Complete this exercise __________ times a day. Exercise D: Forearm rotation, pronation, active 1. Stand or sit with your elbows at your sides. 2. Bend your left / right elbow to an "L" shape (90  degrees). 3. Turn your left / right palm downward until you feel a gentle stretch on the top of your forearm. 4. Hold this position for __________ seconds. 5. Slowly release, and return to the starting position. Repeat __________ times. Complete this exercise __________ times a day. Stretching exercises These exercises warm up your muscles and joints and improve the movement and flexibility of your injured elbow. These exercises also help to relieve pain, numbness, and tingling. These exercises are done using your healthy elbow to help stretch the muscles in your injured elbow. Exercise E: Elbow flexion, active-assisted  1. Hold your left / right arm at your side, and bend your elbow as much as you can using your left / right arm muscles. 2. Use your other hand to bend your left / right elbow farther. Do this by gently pushing up on your forearm until you feel a gentle stretch on the back of your elbow. 3. Hold this position for __________ seconds. 4. Slowly return to the starting position. Repeat __________ times. Complete this exercise __________ times a day. Exercise F: Elbow extension, active-assisted  1. Hold your left / right arm at your side, and straighten your elbow as much as you can using your left / right arm muscles. 2. Use your other hand to straighten the left / right elbow farther. Do this by gently pushing down on your forearm until you feel a gentle stretch on the inside of your elbow. 3. Hold this position for __________ seconds. 4. Slowly return to the starting position. Repeat __________ times. Complete this exercise __________  times a day. Exercise G: Forearm rotation, supination, active-assisted  1. Sit with your left / right elbow bent to an "L" shape (90 degrees), with your forearm resting on a table. 2. Keeping your upper body and shoulder still, rotate your forearm so your left / right palm faces upward. 3. Use your other hand to help rotate your forearm further  until you feel a gentle to moderate stretch. 4. Hold this position for __________ seconds. 5. Slowly release the stretch and return to the starting position. Repeat __________ times. Complete this exercise __________ times a day. Exercise H: Forearm rotation, pronation, active-assisted  1. Sit with your left / right elbow bent to an "L" shape (90 degrees), with your forearm resting on a table. 2. Keeping your upper body and shoulder still, rotate your forearm so your palm faces the tabletop. 3. Use your other hand to help rotate your forearm further until you feel a gentle to moderate stretch. 4. Hold this position for __________ seconds. 5. Slowly release the stretch and return to the starting position. Repeat __________ times. Complete this exercise __________ times a day. Exercise I: Elbow flexion, supine, passive  1. Lie on your back. 2. Extend your left / right arm up in the air, bracing it with your other hand. 3. Let your left / right your hand slowly lower toward your shoulder, while your elbow stays pointed toward the ceiling. You should feel a gentle stretch along the back of your upper arm and elbow. 4. If told by your health care provider, you may increase the intensity of your stretch by adding a small wrist weight or hand weight. 5. Hold this position for __________ seconds. 6. Slowly return to the starting position. Repeat __________ times. Complete this exercise __________ times a day. Exercise J: Elbow extension, passive  1. Lie on your back. Make sure that you are in a comfortable position that lets you relax your arm muscles. 2. Place a folded towel under your left / right upper arm so your elbow and shoulder are at the same height. Straighten your left / right arm so your elbow does not rest on the bed or towel. 3. Let the weight of your hand stretch your elbow. Keep your arm and chest muscles relaxed. You should feel a stretch on the inside of your elbow. 4. If told by  your health care provider, you may increase the intensity of your stretch by adding a small wrist weight or hand weight. 5. Hold this position for __________ seconds. 6. Slowly release the stretch. Repeat __________ times. Complete this exercise __________ times a day. Strengthening exercises These exercises build strength and endurance in your elbow. Endurance is the ability to use your muscles for a long time, even after they get tired. Exercise K: Elbow flexion, isometric  1. Stand or sit up straight. 2. Bend your left / right elbow to an "L" shape (90 degrees), and turn your palm up so your forearm is at the height of your waist. 3. Place your other hand on top of your forearm. Gently push down as your left / right arm resists. Push as hard as you can with both arms without causing any pain or movement at your left / right elbow. 4. Hold this position for __________ seconds. 5. Slowly release the tension in both arms. Let your muscles relax completely before repeating. Repeat __________ times. Complete this exercise __________ times a day. Exercise L: Elbow extensors, isometric  1. Stand or sit up  straight. 2. Place your left / right arm so your palm faces your abdomen and is at the height of your waist. 3. Place your other hand on the underside of your forearm. Gently push up as your left / right arm resists. Push as hard as you can with both arms, without causing any pain or movement at your left / right elbow. 4. Hold this position for __________ seconds. 5. Slowly release the tension in both arms. Let your muscles relax completely before repeating. Repeat __________ times. Complete this exercise __________ times a day. Exercise M: Elbow flexion with forearm palm up  1. Sit upright on a firm chair without armrests, or stand. 2. Place your left / right arm at your side with your palm facing forward. 3. Holding a __________weight or gripping a rubber exercise band or tubing, bend your  elbow to bring your hand toward your shoulder. 4. Hold this position for __________ seconds. 5. Slowly return to the starting position. Repeat __________times. Complete this exercise __________times a day. Exercise N: Elbow extension  1. Sit on a firm chair without armrests, or stand. 2. Keeping your upper arms at your sides, bring both hands up toward your left / right shoulder while gripping a rubber exercise band or tubing. Your left / right hand should be just below the other hand. 3. Straighten your left / right elbow. 4. Hold this position for __________ seconds. 5. Control the resistance as your hand returns to your side. Repeat __________times. Complete this exercise __________times a day. Exercise O: Forearm rotation, supination  1. Sit with your left / right forearm supported on a table. Keep your elbow at waist height. 2. Rest your hand over the edge of the table with your palm facing down. 3. Gently hold a lightweight hammer. 4. Without moving your elbow, slowly rotate your forearm to turn your palm and hand upward to a "thumbs-up" position. 5. Hold this position for __________ seconds. 6. Slowly return to the starting position. Repeat __________times. Complete this exercise __________times a day. Exercise P: Forearm rotation, pronation  1. Sit with your left / right forearm supported on a table. Keep your elbow below shoulder height. 2. Rest your hand over the edge of the table with your palm facing up. 3. Gently hold a lightweight hammer. 4. Without moving your elbow, slowly rotate your forearm to turn your palm and hand upward to a "thumbs-up" position. 5. Hold this position for __________seconds. 6. Slowly return to the starting position. Repeat __________times. Complete this exercise __________times a day. This information is not intended to replace advice given to you by your health care provider. Make sure you discuss any questions you have with your health care  provider. Document Released: 07/29/2005 Document Revised: 04/02/2016 Document Reviewed: 04/23/2015 Elsevier Interactive Patient Education  [employer].

## 2018-03-03 NOTE — Progress Notes (Signed)
BP 119/77   Pulse 88   Temp 99.3 F (37.4 C) (Oral)   Ht 5' 1.89" (1.572 m)   Wt 185 lb (83.9 kg)   LMP 05/26/2014   BMI 33.96 kg/m    Subjective:    Patient ID: Carol Chambers, female    DOB: [birth year], 57 y.o.   MRN: [id number]  HPI: Carol Chambers is a 57 y.o. female presenting on 03/03/2018 for Fall (hurt left arm )  And also into the left this patient comes in for left arm and elbow pain she fell one day ago. She was just walking but she fell on a rock path.  Her most tender areas along the left elbow.  It does extend up into the triceps area and down the Forearm.  She has had bruising.  She has used some ice and ibuprofen.  She states this morning it was quite tender.  Past Medical History:  Diagnosis Date  . Acid reflux   . Anemia   . History of kidney stones   . Hypercholesteremia   . Hypothyroidism   . PONV (postoperative nausea and vomiting)    with Cholecystectomy  . Seasonal allergies    Relevant past medical, surgical, family and social history reviewed and updated as indicated. Interim medical history since our last visit reviewed. Allergies and medications reviewed and updated. DATA REVIEWED: CHART IN EPIC  Family History reviewed for pertinent findings.  Review of Systems  Constitutional: Negative.   HENT: Negative.   Eyes: Negative.   Respiratory: Negative.   Gastrointestinal: Negative.   Genitourinary: Negative.   Musculoskeletal: Positive for arthralgias, joint swelling and myalgias.    Allergies as of 03/03/2018      Reactions   Sulfa Antibiotics Hives   Tequin Swelling   Swelling of tongue   Codeine Itching   Vicodin [hydrocodone-acetaminophen] Other (See Comments)   Hallucinations      Medication List        Accurate as of 03/03/18 11:59 PM. Always use your most recent med list.          albuterol 108 (90 Base) MCG/ACT inhaler Commonly known as:  PROAIR HFA Inhale 2 puffs into the lungs every 4 (four) hours as needed for  wheezing or shortness of breath.   budesonide-formoterol 160-4.5 MCG/ACT inhaler Commonly known as:  SYMBICORT Inhale 2 puffs into the lungs 2 (two) times daily.   cyclobenzaprine 10 MG tablet Commonly known as:  FLEXERIL Take 1 tablet (10 mg total) by mouth 3 (three) times daily as needed for muscle spasms.   FLUoxetine 20 MG capsule Commonly known as:  PROZAC Take 1 capsule (20 mg total) by mouth daily.   FLUoxetine 10 MG capsule Commonly known as:  PROZAC Take 1 capsule (10 mg total) by mouth daily.   GNP MAGNESIUM 250 MG Tabs Generic drug:  Magnesium Take 2 tablets by mouth daily.   levothyroxine 100 MCG tablet Commonly known as:  SYNTHROID, LEVOTHROID Take 1 tablet (100 mcg total) by mouth daily.   montelukast 10 MG tablet Commonly known as:  SINGULAIR Take 1 tablet (10 mg total) by mouth at bedtime.   NASACORT AQ NA Place 1 spray into the nose 2 (two) times daily.   phentermine 37.5 MG capsule Take 1 capsule (37.5 mg total) by mouth every morning.   simvastatin 20 MG tablet Commonly known as:  ZOCOR TAKE 1 TABLET BY MOUTH AT BEDTIME   VITAMIN D PO Take by mouth.  Objective:    BP 119/77   Pulse 88   Temp 99.3 F (37.4 C) (Oral)   Ht 5' 1.89" (1.572 m)   Wt 185 lb (83.9 kg)   LMP 05/26/2014   BMI 33.96 kg/m   Allergies  Allergen Reactions  . Sulfa Antibiotics Hives  . Tequin Swelling    Swelling of tongue  . Codeine Itching  . Vicodin [Hydrocodone-Acetaminophen] Other (See Comments)    Hallucinations     Wt Readings from Last 3 Encounters:  03/03/18 185 lb (83.9 kg)  11/19/17 192 lb 3.2 oz (87.2 kg)  10/28/17 189 lb 6.4 oz (85.9 kg)    Physical Exam  Constitutional: She is oriented to person, place, and time. She appears well-developed and well-nourished.  HENT:  Head: Normocephalic and atraumatic.  Eyes: Pupils are equal, round, and reactive to light. Conjunctivae and EOM are normal.  Cardiovascular: Normal rate, regular  rhythm, normal heart sounds and intact distal pulses.  Pulmonary/Chest: Effort normal and breath sounds normal.  Abdominal: Soft. Bowel sounds are normal.  Musculoskeletal:       Left elbow: She exhibits decreased range of motion and swelling. Tenderness found. Lateral epicondyle and olecranon process tenderness noted.       Left wrist: She exhibits decreased range of motion.       Left forearm: She exhibits tenderness and swelling.       Arms: Neurological: She is alert and oriented to person, place, and time. She has normal reflexes.  Skin: Skin is warm and dry. No rash noted.  Psychiatric: She has a normal mood and affect. Her behavior is normal. Judgment and thought content normal.        Assessment & Plan:   1. Injury of left upper extremity, initial encounter - DG Forearm Left; Future - Ambulatory referral to Orthopedic Surgery  2. Closed fracture of left elbow, initial encounter - Ambulatory referral to Orthopedic Surgery   Continue all other maintenance medications as listed above.  Follow up plan: No follow-ups on file.  Educational handout given for elbow fracture  Terald Sleeper PA-C [facility] [address]  [place], [place] [address] [phone]   03/04/2018, 10:29 AM

## 2018-03-09 ENCOUNTER — Other Ambulatory Visit: Payer: Self-pay | Admitting: Physician Assistant

## 2018-03-09 ENCOUNTER — Ambulatory Visit
Admission: RE | Admit: 2018-03-09 | Discharge: 2018-03-09 | Disposition: A | Discharge status: Home / Self Care | Payer: BLUE CROSS/BLUE SHIELD | Source: Ambulatory Visit | Attending: Physician Assistant | Admitting: Physician Assistant

## 2018-03-09 DIAGNOSIS — S52125A Nondisplaced fracture of head of left radius, initial encounter for closed fracture: Secondary | ICD-10-CM | POA: Diagnosis not present

## 2018-03-27 DIAGNOSIS — M25522 Pain in left elbow: Secondary | ICD-10-CM | POA: Diagnosis not present

## 2018-03-27 DIAGNOSIS — S52125D Nondisplaced fracture of head of left radius, subsequent encounter for closed fracture with routine healing: Secondary | ICD-10-CM | POA: Diagnosis not present

## 2018-04-10 DIAGNOSIS — H04123 Dry eye syndrome of bilateral lacrimal glands: Secondary | ICD-10-CM | POA: Diagnosis not present

## 2018-04-10 DIAGNOSIS — H40033 Anatomical narrow angle, bilateral: Secondary | ICD-10-CM | POA: Diagnosis not present

## 2018-04-10 DIAGNOSIS — H5210 Myopia, unspecified eye: Secondary | ICD-10-CM | POA: Diagnosis not present

## 2018-04-24 DIAGNOSIS — S52125D Nondisplaced fracture of head of left radius, subsequent encounter for closed fracture with routine healing: Secondary | ICD-10-CM | POA: Diagnosis not present

## 2018-05-19 ENCOUNTER — Ambulatory Visit: Payer: BLUE CROSS/BLUE SHIELD | Admitting: Allergy and Immunology

## 2018-05-19 ENCOUNTER — Encounter: Payer: Self-pay | Admitting: Allergy and Immunology

## 2018-05-19 VITALS — BP 122/70 | HR 66 | Resp 18

## 2018-05-19 DIAGNOSIS — J4540 Moderate persistent asthma, uncomplicated: Secondary | ICD-10-CM | POA: Diagnosis not present

## 2018-05-19 DIAGNOSIS — J324 Chronic pansinusitis: Secondary | ICD-10-CM

## 2018-05-19 DIAGNOSIS — J3089 Other allergic rhinitis: Secondary | ICD-10-CM | POA: Diagnosis not present

## 2018-05-19 DIAGNOSIS — K219 Gastro-esophageal reflux disease without esophagitis: Secondary | ICD-10-CM

## 2018-05-19 MED ORDER — ALBUTEROL SULFATE HFA 108 (90 BASE) MCG/ACT IN AERS: 2.0000 | INHALATION_SPRAY | RESPIRATORY_TRACT | 1 refills | Status: DC | PRN

## 2018-05-19 MED ORDER — BUDESONIDE-FORMOTEROL FUMARATE 160-4.5 MCG/ACT IN AERO: 2.0000 | INHALATION_SPRAY | Freq: Two times a day (BID) | RESPIRATORY_TRACT | 5 refills | Status: DC

## 2018-05-19 NOTE — Patient Instructions (Addendum)
  1.  Continue to Allergen avoidance measures  2.  Continue to Treat and prevent inflammation:   A.  OTC Nasocort - 1 spray each nostril 1 time per day    B.  Symbicort 160 - 2 inhalations 1 time per day with spacer  C.  Montelukast 10 mg - 1 tablet 1 time per day   3.  If needed:   A.  Nasal saline multiple times a day  B.  OTC cetirizine 10-20 mg 1 time per day  C.  Pro Air HFA or similar 2 inhalations every 4-6 hours  D.  OTC Mucinex DM 2 tablets twice a day  4.  Treat reflux:   A.  Consolidate caffeine as much as possible  B.  Restart omeprazole 40 mg tablet 1 time per day  5. Obtain fall flu vaccine  6. Return to clinic in January 2020 or earlier if problem

## 2018-05-19 NOTE — Progress Notes (Signed)
Follow-up Note  Referring Provider: Terald Sleeper, PA-C Primary Provider: Theodoro Clock Date of Office Visit: 05/19/2018  Subjective:   Carol Chambers (DOB: 1960-12-22) is a 57 y.o. female who returns to the [facility] on 05/19/2018 in re-evaluation of the following:  HPI: Carol Chambers returns to this clinic in reevaluation of her asthma and allergic rhinitis and chronic sinusitis.  Her last visit to this clinic was 21 January 2018.  During that visit we attempted to decrease her medication use by 50% regarding inhaled controller agents and nasal steroids.  Once again she is really doing so much better on her current medical therapy which includes a collection of anti-inflammatory agents for both her upper and lower airway utilized on a consistent basis.  She has very little issues with coughing or wheezing and has no need to use a short acting bronchodilator and she has had no issues with nasal congestion and can smell and taste at this point in time.  However, she does have issues with continued throat clearing and some mucus in her throat.  She does have reflux disease and was on reflux treatment in the past.  She must use Tums at this point in time about twice a month for issues with regurgitation.  She does drink coffee on a daily basis.  Allergies as of 05/19/2018      Reactions   Sulfa Antibiotics Hives   Tequin Swelling   Swelling of tongue   Codeine Itching   Vicodin [hydrocodone-acetaminophen] Other (See Comments)   Hallucinations      Medication List      albuterol 108 (90 Base) MCG/ACT inhaler Commonly known as:  PROVENTIL HFA;VENTOLIN HFA Inhale 2 puffs into the lungs every 4 (four) hours as needed for wheezing or shortness of breath.   budesonide-formoterol 160-4.5 MCG/ACT inhaler Commonly known as:  SYMBICORT Inhale 2 puffs into the lungs 2 (two) times daily.   cyclobenzaprine 10 MG tablet Commonly known as:  FLEXERIL Take 1 tablet (10 mg total)  by mouth 3 (three) times daily as needed for muscle spasms.   FLUoxetine 20 MG capsule Commonly known as:  PROZAC Take 1 capsule (20 mg total) by mouth daily.   FLUoxetine 10 MG capsule Commonly known as:  PROZAC Take 1 capsule (10 mg total) by mouth daily.   GNP MAGNESIUM 250 MG Tabs Generic drug:  Magnesium Take 2 tablets by mouth daily.   levothyroxine 100 MCG tablet Commonly known as:  SYNTHROID, LEVOTHROID Take 1 tablet (100 mcg total) by mouth daily.   montelukast 10 MG tablet Commonly known as:  SINGULAIR Take 1 tablet (10 mg total) by mouth at bedtime.   NASACORT AQ NA Place 1 spray into the nose 2 (two) times daily.   RESTASIS 0.05 % ophthalmic emulsion Generic drug:  cycloSPORINE Restasis 0.05 % eye drops in a dropperette   simvastatin 20 MG tablet Commonly known as:  ZOCOR TAKE 1 TABLET BY MOUTH AT BEDTIME   VITAMIN D PO Take by mouth.       Past Medical History:  Diagnosis Date  . Acid reflux   . Anemia   . History of kidney stones   . Hypercholesteremia   . Hypothyroidism   . PONV (postoperative nausea and vomiting)    with Cholecystectomy  . Seasonal allergies     Past Surgical History:  Procedure Laterality Date  . BRAVO [place] STUDY N/A 08/23/2014   Procedure: BRAVO [place];  Surgeon:  Inda Castle, MD;  Location: [facility];  Service: Endoscopy;  Laterality: N/A;  48 hour bravo pH study with impedance plethysmography   . BRAVO [place] STUDY N/A 10/10/2014   Procedure: BRAVO [place] STUDY;  Surgeon: Inda Castle, MD;  Location: [facility];  Service: Endoscopy;  Laterality: N/A;  2nd attempt  . CARPAL TUNNEL RELEASE  2003   bilat  . ESOPHAGOGASTRODUODENOSCOPY (EGD) WITH PROPOFOL N/A 08/23/2014   Procedure: ESOPHAGOGASTRODUODENOSCOPY (EGD) WITH PROPOFOL;  Surgeon: Inda Castle, MD;  Location: [facility];  Service: Endoscopy;  Laterality: N/A;  . ESOPHAGOGASTRODUODENOSCOPY (EGD) WITH PROPOFOL N/A 10/10/2014   Procedure:  ESOPHAGOGASTRODUODENOSCOPY (EGD) WITH PROPOFOL;  Surgeon: Inda Castle, MD;  Location: [facility];  Service: Endoscopy;  Laterality: N/A;  . Esophagus stretched     X 2  . FOOT SURGERY     03-24-14 Plantar Fasciatitis  . FOOT SURGERY Left    foot surgery 09-29-14  . groin cyst removed  2000   right  . LAPAROSCOPIC CHOLECYSTECTOMY  2003    Review of systems negative except as noted in HPI / PMHx or noted below:  Review of Systems  Constitutional: Negative.   HENT: Negative.   Eyes: Negative.   Respiratory: Negative.   Cardiovascular: Negative.   Gastrointestinal: Negative.   Genitourinary: Negative.   Musculoskeletal: Negative.   Skin: Negative.   Neurological: Negative.   Endo/Heme/Allergies: Negative.   Psychiatric/Behavioral: Negative.      Objective:   Vitals:   05/19/18 1557  BP: 122/70  Pulse: 66  Resp: 18  SpO2: 98%          Physical Exam  HENT:  Head: Normocephalic.  Right Ear: Tympanic membrane, external ear and ear canal normal.  Left Ear: Tympanic membrane, external ear and ear canal normal.  Nose: Nose normal. No mucosal edema or rhinorrhea.  Mouth/Throat: Uvula is midline, oropharynx is clear and moist and mucous membranes are normal. No oropharyngeal exudate.  Eyes: Conjunctivae are normal.  Neck: Trachea normal. No tracheal tenderness present. No tracheal deviation present. No thyromegaly present.  Cardiovascular: Normal rate, regular rhythm, S1 normal, S2 normal and normal heart sounds.  No murmur heard. Pulmonary/Chest: Breath sounds normal. No stridor. No respiratory distress. She has no wheezes. She has no rales.  Musculoskeletal: She exhibits no edema.  Lymphadenopathy:       Head (right side): No tonsillar adenopathy present.       Head (left side): No tonsillar adenopathy present.    She has no cervical adenopathy.  Neurological: She is alert.  Skin: No rash noted. She is not diaphoretic. No erythema. Nails show no clubbing.     Diagnostics:    Spirometry was performed and demonstrated an FEV1 of 2.25 at 91 % of predicted.  The patient had an Asthma Control Test with the following results: ACT Total Score: 22.    Assessment and Plan:   1. Asthma, moderate persistent, well-controlled   2. Other allergic rhinitis   3. Chronic pansinusitis   4. LPRD (laryngopharyngeal reflux disease)     1.  Continue to Allergen avoidance measures  2.  Continue to Treat and prevent inflammation:   A.  OTC Nasocort - 1 spray each nostril 1 time per day    B.  Symbicort 160 - 2 inhalations 1 time per day with spacer  C.  Montelukast 10 mg - 1 tablet 1 time per day   3.  If needed:   A.  Nasal saline multiple times a  day  B.  OTC cetirizine 10-20 mg 1 time per day  C.  Pro Air HFA or similar 2 inhalations every 4-6 hours  D.  OTC Mucinex DM 2 tablets twice a day  4.  Treat reflux:   A.  Consolidate caffeine as much as possible  B.  Restart omeprazole 40 mg tablet 1 time per day  5. Obtain fall flu vaccine  6. Return to clinic in January 2020 or earlier if problem  Valia has done relatively well on her anti-inflammatory medications utilized on a consistent basis directed against respiratory tract inflammation and we will continue to have her use these agents at this point in time.  In addition, she does have a history that is somewhat consistent with LPR and we will start her on omeprazole and have her consolidate caffeine consumption for the next few months.  I will see her back in this clinic in January 2020 or earlier if there is a problem.   Allena Katz, MD Allergy / Immunology [place]

## 2018-05-20 ENCOUNTER — Encounter: Payer: Self-pay | Admitting: Allergy and Immunology

## 2018-05-22 DIAGNOSIS — S52125D Nondisplaced fracture of head of left radius, subsequent encounter for closed fracture with routine healing: Secondary | ICD-10-CM | POA: Diagnosis not present

## 2018-06-08 IMAGING — CT CT PARANASAL SINUSES LIMITED
3 series · 16 of 47 positions shown, 19 images · non-contrast
Comparison: None.

CLINICAL DATA: Chronic pansinusitis. Sinus infection for 1 week.
Pressure and pain into the maxillary and frontal sinuses
bilaterally.

EXAM:
CT PARANASAL SINUS LIMITED WITHOUT CONTRAST
TECHNIQUE: Non-contiguous multidetector CT images of the paranasal sinuses were
obtained in a single plane without contrast.

[Series 2: max soft · axial · 0.35mm/px · z∈[-89,+71]mm · 10 of 94 slices shown, 13 images]
[im 7/94  brain]
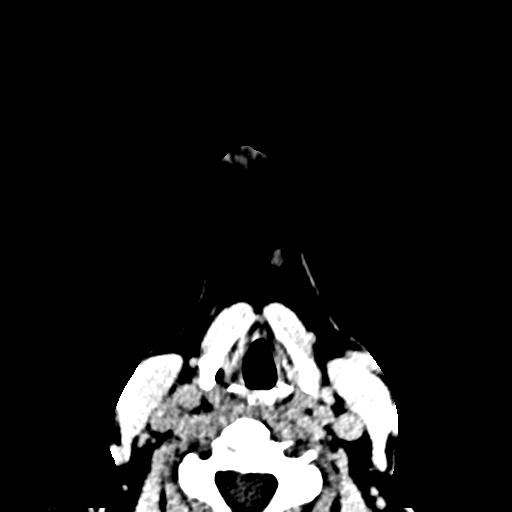
[im 7/94  bone]
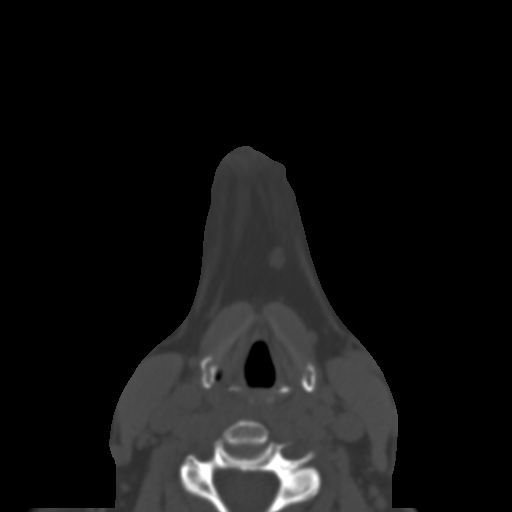
[im 17/94  bone]
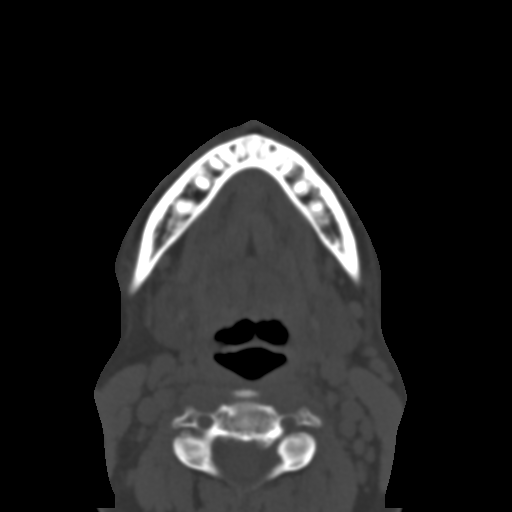
[im 26/94  bone]
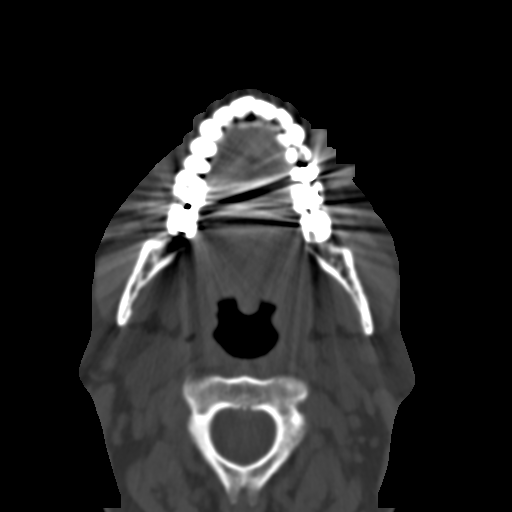
[im 33/94  bone]
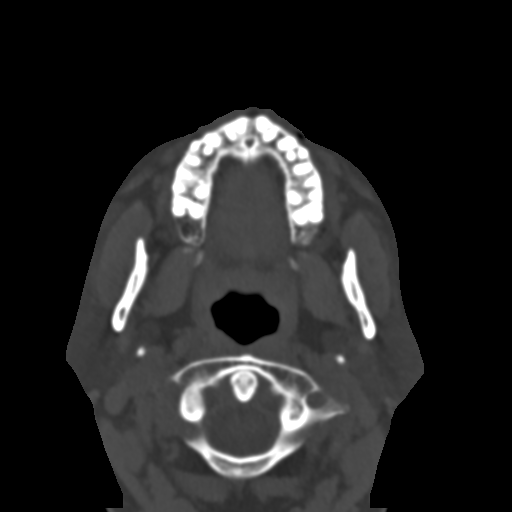
[im 42/94  brain]
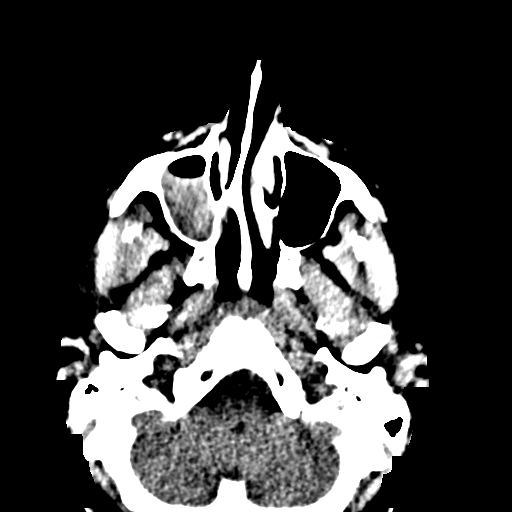
[im 42/94  bone]
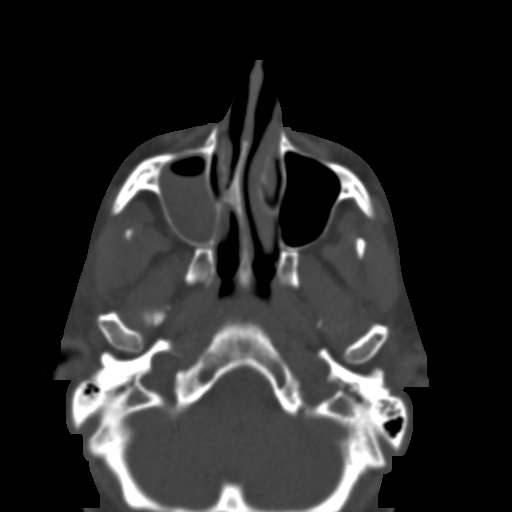
[im 52/94  bone]
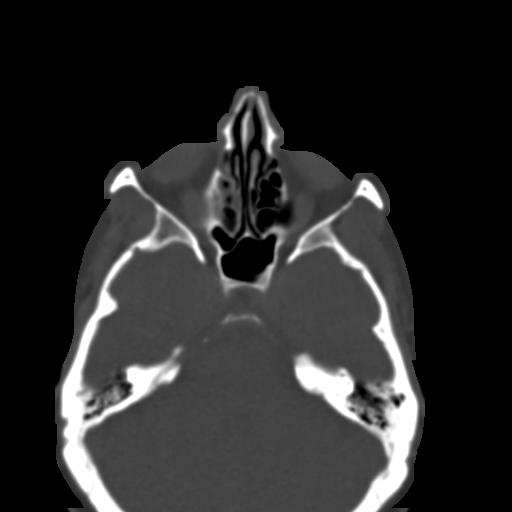
[im 61/94  bone]
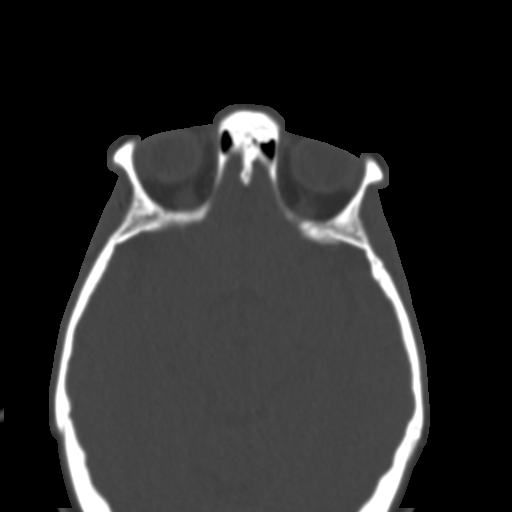
[im 71/94  bone]
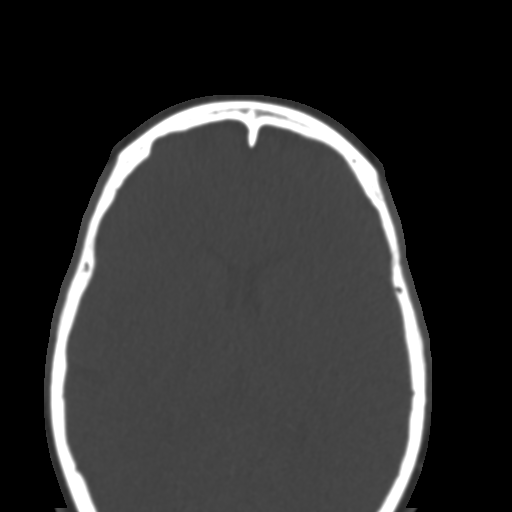
[im 77/94  brain]
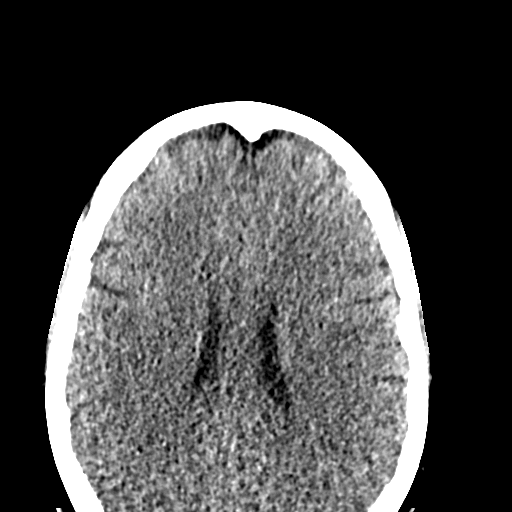
[im 77/94  bone]
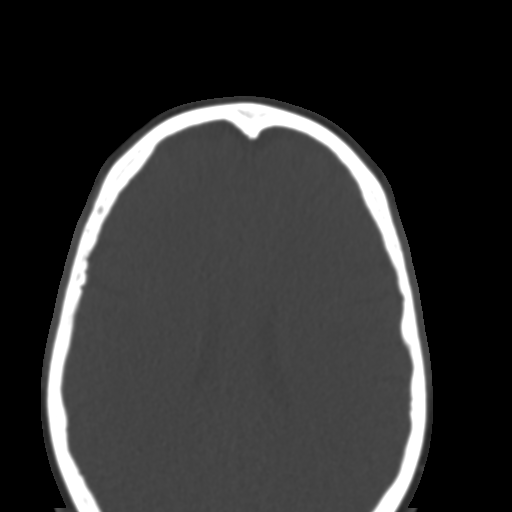
[im 87/94  bone]
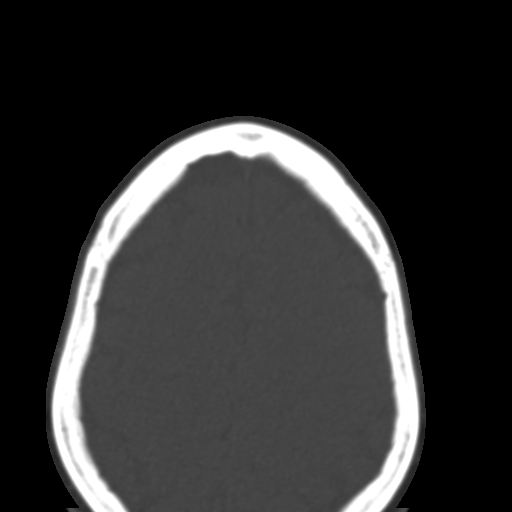

[Series 6: coronal soft · coronal · 0.36mm/px · 3 of 87 slices shown]
[im 29/87  bone]
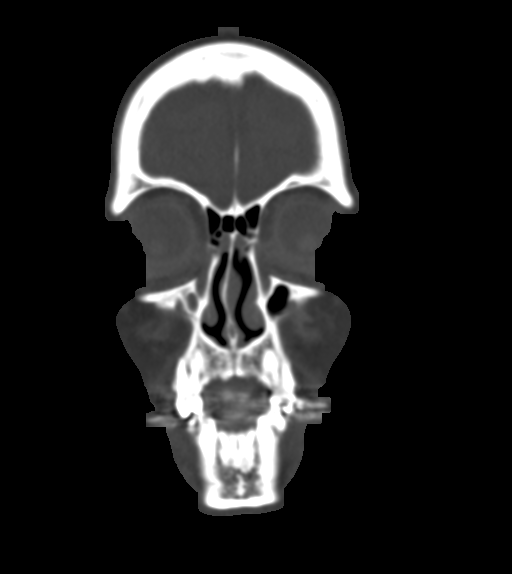
[im 39/87  bone]
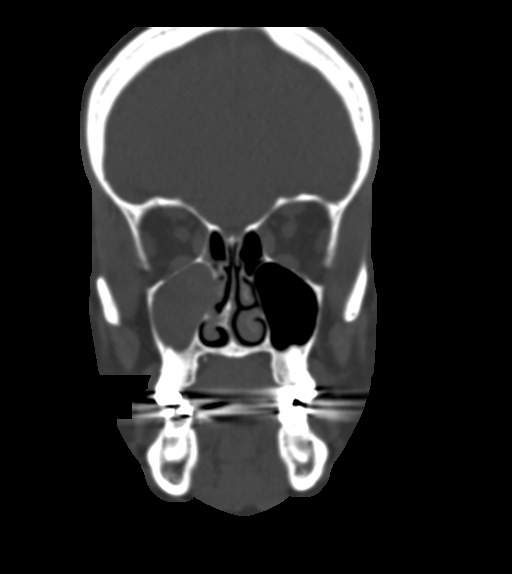
[im 48/87  bone]
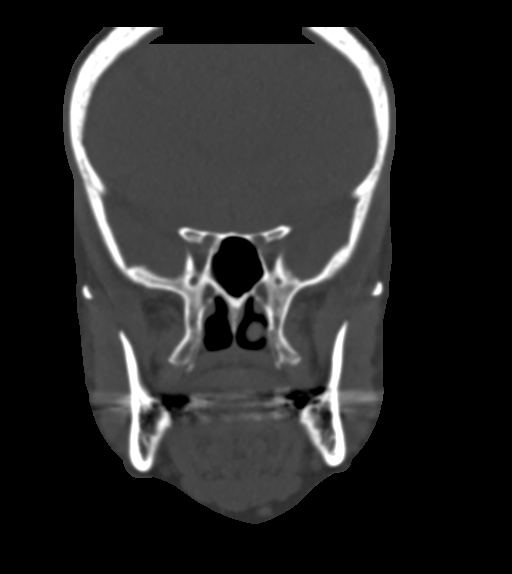

[Series 7: sagittal soft · sagittal · 0.41mm/px · 3 of 93 slices shown]
[im 31/93  bone]
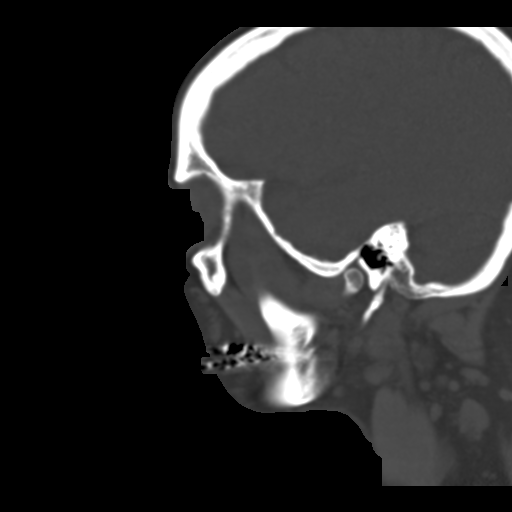
[im 47/93  bone]
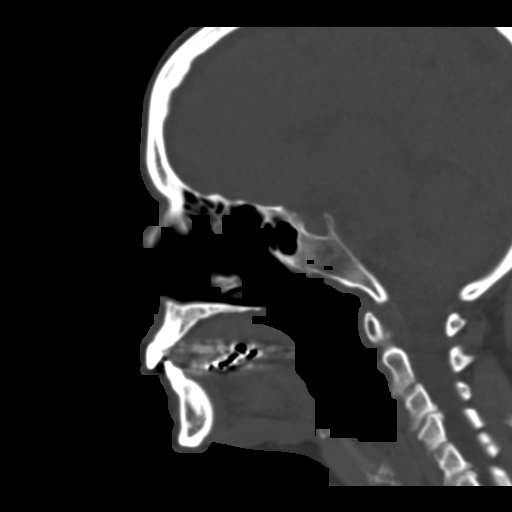
[im 62/93  bone]
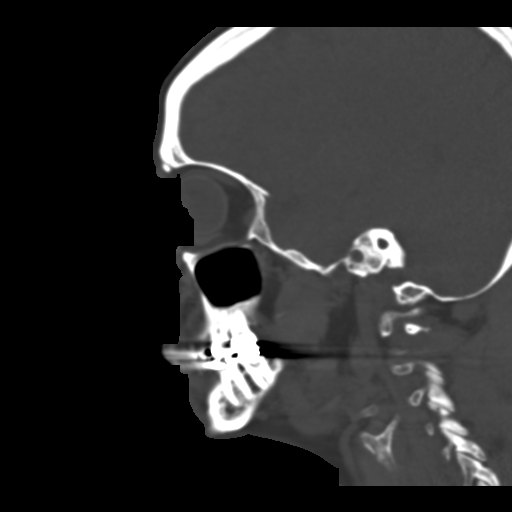

[16 of 47 positions shown; findings below may reference images not displayed]

FINDINGS: A prominent right-sided Haller cell displaces the ostiomeatal
complex medially. The ostiomeatal complex is obstructed. A fluid
level is present with near complete opacification of the right
maxillary sinus. Anterior right ethmoid mucosal thickening is
present. The frontal sinuses are hypoplastic without focal disease.
Sphenoid sinuses are clear. The mastoid air cells are clear
bilaterally. Nasal cavity is clear. Rightward nasal septal spurring
extends 7 mm from the midline and distorts the right inferior
turbinate.

Limited imaging of the brain is unremarkable.
IMPRESSION: 1. Prominent Haller cell with medial deviation of the right
ostiomeatal complex.
2. Occluded right ostiomeatal complex with near total opacification
of the right maxillary sinus. A fluid level is present compatible
with acute sinusitis.
3. Hypoplastic frontal sinuses bilaterally are clear.
4. Rightward nasal septal spurring extends 7 mm from the midline.

## 2018-06-25 DIAGNOSIS — S52125D Nondisplaced fracture of head of left radius, subsequent encounter for closed fracture with routine healing: Secondary | ICD-10-CM | POA: Diagnosis not present

## 2018-07-22 ENCOUNTER — Encounter: Payer: Self-pay | Admitting: Gynecology

## 2018-07-23 MED ORDER — FLUCONAZOLE 150 MG PO TABS: 150.0000 mg | ORAL_TABLET | Freq: Once | ORAL | 0 refills | Status: AC

## 2018-07-23 NOTE — Telephone Encounter (Signed)
Does not sound classic for yeast as this usually has irritation and itching but we can try one Diflucan 150 mg tablet.  If symptoms persist then office visit for examination.

## 2018-07-29 ENCOUNTER — Encounter: Payer: Self-pay | Admitting: Gynecology

## 2018-08-06 ENCOUNTER — Encounter: Payer: Self-pay | Admitting: Physician Assistant

## 2018-08-06 ENCOUNTER — Ambulatory Visit: Payer: BLUE CROSS/BLUE SHIELD | Admitting: Gynecology

## 2018-08-06 ENCOUNTER — Encounter: Payer: Self-pay | Admitting: Gynecology

## 2018-08-06 VITALS — BP 124/72

## 2018-08-06 DIAGNOSIS — N898 Other specified noninflammatory disorders of vagina: Secondary | ICD-10-CM | POA: Diagnosis not present

## 2018-08-06 LAB — WET PREP FOR TRICH, YEAST, CLUE

## 2018-08-06 MED ORDER — CLINDAMYCIN PHOSPHATE 2 % VA CREA: 1.0000 | TOPICAL_CREAM | Freq: Every day | VAGINAL | 0 refills | Status: DC

## 2018-08-06 MED ORDER — FLUCONAZOLE 150 MG PO TABS: 150.0000 mg | ORAL_TABLET | Freq: Once | ORAL | 0 refills | Status: AC

## 2018-08-06 NOTE — Patient Instructions (Signed)
Take the Diflucan pill now and repeat it in 1 week.  Use the vaginal cream nightly x7 nights.  Follow-up if symptoms persist, worsen or recur.

## 2018-08-06 NOTE — Progress Notes (Signed)
    Carol Chambers 1961/05/31 [id number]        57 y.o.  G1P0010 presents complaining of a heavier vaginal discharge over the past several months.  Was treated for yeast at her last annual exam in February.  No itching, irritation or odor.  No urinary symptoms such as frequency dysuria urgency.  Past medical history,surgical history, problem list, medications, allergies, family history and social history were all reviewed and documented in the EPIC chart.  Directed ROS with pertinent positives and negatives documented in the history of present illness/assessment and plan.  Exam: Carol Chambers assistant Vitals:   08/06/18 1106  BP: 124/72   General appearance:  Normal Abdomen soft nontender without masses guarding rebound Pelvic external BUS vagina with frothy white discharge.  Cervix normal.  Uterus anteverted normal size midline mobile nontender.  Adnexa without masses or tenderness.  Assessment/Plan:  57 y.o. G1P0010 with persistent vaginal discharge.  Wet prep was positive for yeast.  I am going to cover her both for yeast and bacterial vaginosis with Diflucan 150 mg now and repeat in 1 week.  Cleocin vaginal cream nightly x7 nights after discussing medication options.  Follow-up if symptoms persist, worsen or recur    Carol Auerbach MD, 11:21 AM 08/06/2018

## 2018-08-25 ENCOUNTER — Encounter: Payer: Self-pay | Admitting: Physician Assistant

## 2018-08-25 DIAGNOSIS — S92511A Displaced fracture of proximal phalanx of right lesser toe(s), initial encounter for closed fracture: Secondary | ICD-10-CM | POA: Diagnosis not present

## 2018-09-01 ENCOUNTER — Encounter: Payer: Self-pay | Admitting: Physician Assistant

## 2018-09-04 ENCOUNTER — Encounter: Payer: Self-pay | Admitting: Physician Assistant

## 2018-09-04 ENCOUNTER — Ambulatory Visit: Payer: BLUE CROSS/BLUE SHIELD | Admitting: Physician Assistant

## 2018-09-04 VITALS — BP 130/79 | HR 71 | Temp 98.3°F | Ht 61.89 in | Wt 197.2 lb

## 2018-09-04 DIAGNOSIS — E039 Hypothyroidism, unspecified: Secondary | ICD-10-CM

## 2018-09-04 DIAGNOSIS — R635 Abnormal weight gain: Secondary | ICD-10-CM | POA: Diagnosis not present

## 2018-09-04 DIAGNOSIS — J40 Bronchitis, not specified as acute or chronic: Secondary | ICD-10-CM

## 2018-09-04 DIAGNOSIS — F325 Major depressive disorder, single episode, in full remission: Secondary | ICD-10-CM

## 2018-09-04 MED ORDER — LEVOTHYROXINE SODIUM 125 MCG PO TABS: 125.0000 ug | ORAL_TABLET | Freq: Every day | ORAL | 5 refills | Status: DC

## 2018-09-04 MED ORDER — CLINDAMYCIN HCL 300 MG PO CAPS: 300.0000 mg | ORAL_CAPSULE | Freq: Three times a day (TID) | ORAL | 0 refills | Status: DC

## 2018-09-04 MED ORDER — BUPROPION HCL ER (XL) 150 MG PO TB24: 150.0000 mg | ORAL_TABLET | Freq: Every day | ORAL | 5 refills | Status: DC

## 2018-09-04 MED ORDER — FLUCONAZOLE 150 MG PO TABS: ORAL_TABLET | ORAL | 0 refills | Status: DC

## 2018-09-04 NOTE — Patient Instructions (Signed)
Breakfast: eggs 2-3 Or greek yogurt low fat DANNON 1 slice delightful [employer] bread  Lunch: 2 slice [employer] delightfully bread       Or Nature's Own Light 4 ounces chicken, turkey, roast beef 1 slice Thin sliced cheese [place] Mustard ok 1 piece of fruit  Supper: 6 ounces lean meat 2 cups raw/cooked veg or 1 cup pintos, corn lima  3 snacks 100 calories or less  

## 2018-09-07 DIAGNOSIS — F325 Major depressive disorder, single episode, in full remission: Secondary | ICD-10-CM | POA: Insufficient documentation

## 2018-09-07 NOTE — Progress Notes (Signed)
BP 130/79   Pulse 71   Temp 98.3 F (36.8 C) (Oral)   Ht 5' 1.89" (1.572 m)   Wt 197 lb 3.2 oz (89.4 kg)   LMP 05/26/2014   SpO2 100%   BMI 36.20 kg/m    Subjective:    Patient ID: Carol Chambers, female    DOB: 09/29/1960, 58 y.o.   MRN: [id number]  HPI: Carol Chambers is a 58 y.o. female presenting on 09/04/2018 for Cough and Hypothyroidism  This patient comes in for periodic recheck on medications and conditions including hypothyroid, depression.  Patient with several days of progressing upper respiratory and bronchial symptoms. Initially there was more upper respiratory congestion. This progressed to having significant cough that is productive throughout the day and severe at night. There is occasional wheezing after coughing. Sometimes there is slight dyspnea on exertion. It is productive mucus that is yellow in color. Denies any blood.  All medications are reviewed today. There are no reports of any problems with the medications. All of the medical conditions are reviewed and updated.  Lab work is reviewed and will be ordered as medically necessary. There are no new problems reported with today's visit.   Past Medical History:  Diagnosis Date  . Acid reflux   . Anemia   . History of kidney stones   . Hypercholesteremia   . Hypothyroidism   . PONV (postoperative nausea and vomiting)    with Cholecystectomy  . Seasonal allergies    Relevant past medical, surgical, family and social history reviewed and updated as indicated. Interim medical history since our last visit reviewed. Allergies and medications reviewed and updated. DATA REVIEWED: CHART IN EPIC  Family History reviewed for pertinent findings.  Review of Systems  Constitutional: Positive for chills and fatigue. Negative for activity change and appetite change.  HENT: Positive for congestion, postnasal drip, sinus pain and sore throat.   Eyes: Negative.   Respiratory: Positive for cough. Negative for wheezing.    Cardiovascular: Negative.  Negative for chest pain, palpitations and leg swelling.  Gastrointestinal: Negative.   Genitourinary: Negative.   Musculoskeletal: Negative.   Skin: Negative.   Neurological: Positive for headaches.    Allergies as of 09/04/2018      Reactions   Sulfa Antibiotics Hives   Tequin Swelling   Swelling of tongue   Codeine Itching   Vicodin [hydrocodone-acetaminophen] Other (See Comments)   Hallucinations      Medication List       Accurate as of September 04, 2018 11:59 PM. Always use your most recent med list.        albuterol 108 (90 Base) MCG/ACT inhaler Commonly known as:  PROAIR HFA Inhale 2 puffs into the lungs every 4 (four) hours as needed for wheezing or shortness of breath.   budesonide-formoterol 160-4.5 MCG/ACT inhaler Commonly known as:  SYMBICORT Inhale 2 puffs into the lungs 2 (two) times daily.   buPROPion 150 MG 24 hr tablet Commonly known as:  WELLBUTRIN XL Take 1-2 tablets (150-300 mg total) by mouth daily.   clindamycin 2 % vaginal cream Commonly known as:  CLEOCIN Place 1 Applicatorful vaginally at bedtime.   clindamycin 300 MG capsule Commonly known as:  CLEOCIN Take 1 capsule (300 mg total) by mouth 3 (three) times daily.   cyclobenzaprine 10 MG tablet Commonly known as:  FLEXERIL Take 1 tablet (10 mg total) by mouth 3 (three) times daily as needed for muscle spasms.   fluconazole 150 MG tablet Commonly  known as:  DIFLUCAN 1 po q week x 4 weeks   FLUoxetine 20 MG capsule Commonly known as:  PROZAC Take 1 capsule (20 mg total) by mouth daily.   FLUoxetine 10 MG capsule Commonly known as:  PROZAC Take 1 capsule (10 mg total) by mouth daily.   GNP MAGNESIUM 250 MG Tabs Generic drug:  Magnesium Take 2 tablets by mouth daily.   levothyroxine 125 MCG tablet Commonly known as:  SYNTHROID, LEVOTHROID Take 1 tablet (125 mcg total) by mouth daily.   montelukast 10 MG tablet Commonly known as:  SINGULAIR Take 1  tablet (10 mg total) by mouth at bedtime.   NASACORT AQ NA Place 1 spray into the nose 2 (two) times daily.   RESTASIS 0.05 % ophthalmic emulsion Generic drug:  cycloSPORINE Restasis 0.05 % eye drops in a dropperette   simvastatin 20 MG tablet Commonly known as:  ZOCOR TAKE 1 TABLET BY MOUTH AT BEDTIME   VITAMIN D PO Take by mouth.          Objective:    BP 130/79   Pulse 71   Temp 98.3 F (36.8 C) (Oral)   Ht 5' 1.89" (1.572 m)   Wt 197 lb 3.2 oz (89.4 kg)   LMP 05/26/2014   SpO2 100%   BMI 36.20 kg/m   Allergies  Allergen Reactions  . Sulfa Antibiotics Hives  . Tequin Swelling    Swelling of tongue  . Codeine Itching  . Vicodin [Hydrocodone-Acetaminophen] Other (See Comments)    Hallucinations     Wt Readings from Last 3 Encounters:  09/04/18 197 lb 3.2 oz (89.4 kg)  03/03/18 185 lb (83.9 kg)  11/19/17 192 lb 3.2 oz (87.2 kg)    Physical Exam Constitutional:      Appearance: She is well-developed.  HENT:     Head: Normocephalic and atraumatic.     Right Ear: Drainage and tenderness present.     Left Ear: Drainage and tenderness present.     Nose: Mucosal edema and rhinorrhea present.     Right Sinus: No maxillary sinus tenderness or frontal sinus tenderness.     Left Sinus: No maxillary sinus tenderness or frontal sinus tenderness.     Mouth/Throat:     Pharynx: Oropharyngeal exudate and posterior oropharyngeal erythema present.  Eyes:     Conjunctiva/sclera: Conjunctivae normal.     Pupils: Pupils are equal, round, and reactive to light.  Neck:     Musculoskeletal: Normal range of motion and neck supple.  Cardiovascular:     Rate and Rhythm: Normal rate and regular rhythm.     Heart sounds: Normal heart sounds.  Pulmonary:     Effort: Pulmonary effort is normal.     Breath sounds: Examination of the right-upper field reveals wheezing. Examination of the left-upper field reveals wheezing. Wheezing present.  Abdominal:     General: Bowel sounds  are normal.     Palpations: Abdomen is soft.  Skin:    General: Skin is warm and dry.     Findings: No rash.  Neurological:     Mental Status: She is alert and oriented to person, place, and time.     Deep Tendon Reflexes: Reflexes are normal and symmetric.  Psychiatric:        Behavior: Behavior normal.        Thought Content: Thought content normal.        Judgment: Judgment normal.         Assessment & Plan:  1. Bronchitis - clindamycin (CLEOCIN) 300 MG capsule; Take 1 capsule (300 mg total) by mouth 3 (three) times daily.  Dispense: 30 capsule; Refill: 0 - fluconazole (DIFLUCAN) 150 MG tablet; 1 po q week x 4 weeks  Dispense: 4 tablet; Refill: 0  2. Hypothyroidism, unspecified type - levothyroxine (SYNTHROID, LEVOTHROID) 125 MCG tablet; Take 1 tablet (125 mcg total) by mouth daily.  Dispense: 30 tablet; Refill: 5 - TSH; Future  3. Weight gain - buPROPion (WELLBUTRIN XL) 150 MG 24 hr tablet; Take 1-2 tablets (150-300 mg total) by mouth daily.  Dispense: 60 tablet; Refill: 5  4. Depression, major, single episode, complete remission (HCC) - buPROPion (WELLBUTRIN XL) 150 MG 24 hr tablet; Take 1-2 tablets (150-300 mg total) by mouth daily.  Dispense: 60 tablet; Refill: 5   Continue all other maintenance medications as listed above.  Follow up plan: Return in about 4 weeks (around 10/02/2018).  Educational handout given for [place] PA-C [facility] [address]  [place], [place] [address] [phone]   09/07/2018, 11:31 AM

## 2018-09-09 ENCOUNTER — Ambulatory Visit: Payer: BLUE CROSS/BLUE SHIELD | Admitting: Physician Assistant

## 2018-09-12 ENCOUNTER — Other Ambulatory Visit: Payer: Self-pay | Admitting: Physician Assistant

## 2018-09-16 ENCOUNTER — Other Ambulatory Visit: Payer: Self-pay

## 2018-09-16 ENCOUNTER — Encounter: Payer: Self-pay | Admitting: Physician Assistant

## 2018-09-16 DIAGNOSIS — E785 Hyperlipidemia, unspecified: Secondary | ICD-10-CM

## 2018-09-16 DIAGNOSIS — E039 Hypothyroidism, unspecified: Secondary | ICD-10-CM

## 2018-10-03 ENCOUNTER — Other Ambulatory Visit: Payer: BLUE CROSS/BLUE SHIELD

## 2018-10-03 DIAGNOSIS — E785 Hyperlipidemia, unspecified: Secondary | ICD-10-CM

## 2018-10-03 DIAGNOSIS — E039 Hypothyroidism, unspecified: Secondary | ICD-10-CM | POA: Diagnosis not present

## 2018-10-04 LAB — CMP14+EGFR
ALT: 22 IU/L (ref 0–32)
AST: 29 IU/L (ref 0–40)
Albumin/Globulin Ratio: 1.8 (ref 1.2–2.2)
Albumin: 4.4 g/dL (ref 3.8–4.9)
Alkaline Phosphatase: 97 IU/L (ref 39–117)
BUN/Creatinine Ratio: 10 (ref 9–23)
BUN: 11 mg/dL (ref 6–24)
Bilirubin Total: 0.4 mg/dL (ref 0.0–1.2)
CO2: 22 mmol/L (ref 20–29)
Calcium: 9.5 mg/dL (ref 8.7–10.2)
Chloride: 105 mmol/L (ref 96–106)
Creatinine, Ser: 1.07 mg/dL — ABNORMAL HIGH (ref 0.57–1.00)
GFR calc Af Amer: 67 mL/min/{1.73_m2} (ref >59)
GFR calc non Af Amer: 58 mL/min/{1.73_m2} — ABNORMAL LOW (ref >59)
Globulin, Total: 2.4 g/dL (ref 1.5–4.5)
Glucose: 113 mg/dL — ABNORMAL HIGH (ref 65–99)
Potassium: 4.7 mmol/L (ref 3.5–5.2)
Sodium: 143 mmol/L (ref 134–144)
Total Protein: 6.8 g/dL (ref 6.0–8.5)

## 2018-10-04 LAB — LIPID PANEL
Chol/HDL Ratio: 2.2 ratio (ref 0.0–4.4)
Cholesterol, Total: 208 mg/dL — ABNORMAL HIGH (ref 100–199)
HDL: 94 mg/dL (ref >39)
LDL Calculated: 96 mg/dL (ref 0–99)
Triglycerides: 90 mg/dL (ref 0–149)
VLDL Cholesterol Cal: 18 mg/dL (ref 5–40)

## 2018-10-04 LAB — CBC WITH DIFFERENTIAL/PLATELET
Basophils Absolute: 0.1 10*3/uL (ref 0.0–0.2)
Basos: 1 %
EOS (ABSOLUTE): 0.2 10*3/uL (ref 0.0–0.4)
Eos: 3 %
Hematocrit: 41.9 % (ref 34.0–46.6)
Hemoglobin: 13.9 g/dL (ref 11.1–15.9)
IMMATURE GRANULOCYTES: 0 %
Immature Grans (Abs): 0 10*3/uL (ref 0.0–0.1)
LYMPHS: 29 %
Lymphocytes Absolute: 1.9 10*3/uL (ref 0.7–3.1)
MCH: 31.2 pg (ref 26.6–33.0)
MCHC: 33.2 g/dL (ref 31.5–35.7)
MCV: 94 fL (ref 79–97)
Monocytes Absolute: 0.5 10*3/uL (ref 0.1–0.9)
Monocytes: 7 %
NEUTROS PCT: 60 %
Neutrophils Absolute: 3.8 10*3/uL (ref 1.4–7.0)
PLATELETS: 251 10*3/uL (ref 150–450)
RBC: 4.45 x10E6/uL (ref 3.77–5.28)
RDW: 13.1 % (ref 11.7–15.4)
WBC: 6.5 10*3/uL (ref 3.4–10.8)

## 2018-10-04 LAB — THYROID PANEL WITH TSH
Free Thyroxine Index: 1.4 (ref 1.2–4.9)
T3 Uptake Ratio: 25 % (ref 24–39)
T4, Total: 5.7 ug/dL (ref 4.5–12.0)
TSH: 1.34 u[IU]/mL (ref 0.450–4.500)

## 2018-10-06 ENCOUNTER — Ambulatory Visit: Payer: BLUE CROSS/BLUE SHIELD | Admitting: Physician Assistant

## 2018-10-06 ENCOUNTER — Encounter: Payer: Self-pay | Admitting: Physician Assistant

## 2018-10-06 VITALS — BP 101/62 | HR 82 | Temp 99.8°F | Ht 61.89 in | Wt 196.2 lb

## 2018-10-06 DIAGNOSIS — R635 Abnormal weight gain: Secondary | ICD-10-CM | POA: Diagnosis not present

## 2018-10-06 DIAGNOSIS — F325 Major depressive disorder, single episode, in full remission: Secondary | ICD-10-CM | POA: Diagnosis not present

## 2018-10-06 DIAGNOSIS — R739 Hyperglycemia, unspecified: Secondary | ICD-10-CM | POA: Diagnosis not present

## 2018-10-06 DIAGNOSIS — E039 Hypothyroidism, unspecified: Secondary | ICD-10-CM

## 2018-10-06 LAB — BAYER DCA HB A1C WAIVED: HB A1C (BAYER DCA - WAIVED): 5.3 % (ref <7.0)

## 2018-10-06 MED ORDER — CYCLOBENZAPRINE HCL 10 MG PO TABS: 10.0000 mg | ORAL_TABLET | Freq: Three times a day (TID) | ORAL | 0 refills | Status: DC | PRN

## 2018-10-06 MED ORDER — BUPROPION HCL ER (XL) 300 MG PO TB24: 300.0000 mg | ORAL_TABLET | Freq: Every day | ORAL | 3 refills | Status: DC

## 2018-10-06 MED ORDER — LEVOTHYROXINE SODIUM 125 MCG PO TABS: 125.0000 ug | ORAL_TABLET | Freq: Every day | ORAL | 3 refills | Status: DC

## 2018-10-06 NOTE — Addendum Note (Signed)
Addended by: Thana Ates on: 10/06/2018 09:24 AM   Modules accepted: Orders

## 2018-10-06 NOTE — Progress Notes (Signed)
BP 101/62   Pulse 82   Temp 99.8 F (37.7 C) (Oral)   Ht 5' 1.89" (1.572 m)   Wt 196 lb 3.2 oz (89 kg)   LMP 05/26/2014   BMI 36.01 kg/m    Subjective:    Patient ID: Carol Chambers, female    DOB: 02-04-1961, 58 y.o.   MRN: [id number]  HPI: Carol Chambers is a 58 y.o. female presenting on 10/06/2018 for Hypothyroidism (3 month. Had labs on Saturday)  This patient comes in for periodic recheck on medications and conditions including hypothyroidism, depression, weight gain.  Patient did have labs performed and everything is back in order very good even her thyroid panel her fasting glucose was 113 and this is the highest it is been a while.  We are going to draw an A1c today and check for early diabetes.  Instructed her to go ahead and work on a reduced sugar and carb diet..   All medications are reviewed today. There are no reports of any problems with the medications. All of the medical conditions are reviewed and updated.  Lab work is reviewed and will be ordered as medically necessary. There are no new problems reported with today's visit.   Past Medical History:  Diagnosis Date  . Acid reflux   . Anemia   . History of kidney stones   . Hypercholesteremia   . Hypothyroidism   . PONV (postoperative nausea and vomiting)    with Cholecystectomy  . Seasonal allergies    Relevant past medical, surgical, family and social history reviewed and updated as indicated. Interim medical history since our last visit reviewed. Allergies and medications reviewed and updated. DATA REVIEWED: CHART IN EPIC  Family History reviewed for pertinent findings.  Review of Systems  Constitutional: Negative.   HENT: Negative.   Eyes: Negative.   Respiratory: Negative.   Gastrointestinal: Negative.   Genitourinary: Negative.     Allergies as of 10/06/2018      Reactions   Sulfa Antibiotics Hives   Tequin Swelling   Swelling of tongue   Codeine Itching   Vicodin [hydrocodone-acetaminophen]  Other (See Comments)   Hallucinations      Medication List       Accurate as of October 06, 2018  8:19 AM. Always use your most recent med list.        albuterol 108 (90 Base) MCG/ACT inhaler Commonly known as:  PROAIR HFA Inhale 2 puffs into the lungs every 4 (four) hours as needed for wheezing or shortness of breath.   budesonide-formoterol 160-4.5 MCG/ACT inhaler Commonly known as:  SYMBICORT Inhale 2 puffs into the lungs 2 (two) times daily.   buPROPion 300 MG 24 hr tablet Commonly known as:  WELLBUTRIN XL Take 1 tablet (300 mg total) by mouth daily.   cyclobenzaprine 10 MG tablet Commonly known as:  FLEXERIL Take 1 tablet (10 mg total) by mouth 3 (three) times daily as needed for muscle spasms.   fluconazole 150 MG tablet Commonly known as:  DIFLUCAN 1 po q week x 4 weeks   FLUoxetine 20 MG capsule Commonly known as:  PROZAC Take 1 capsule (20 mg total) by mouth daily.   FLUoxetine 10 MG capsule Commonly known as:  PROZAC Take 1 capsule (10 mg total) by mouth daily.   GNP MAGNESIUM 250 MG Tabs Generic drug:  Magnesium Take 2 tablets by mouth daily.   levothyroxine 125 MCG tablet Commonly known as:  SYNTHROID, LEVOTHROID Take 1 tablet (125  mcg total) by mouth daily.   montelukast 10 MG tablet Commonly known as:  SINGULAIR Take 1 tablet (10 mg total) by mouth at bedtime.   NASACORT AQ NA Place 1 spray into the nose 2 (two) times daily.   RESTASIS 0.05 % ophthalmic emulsion Generic drug:  cycloSPORINE Restasis 0.05 % eye drops in a dropperette   simvastatin 20 MG tablet Commonly known as:  ZOCOR TAKE 1 TABLET BY MOUTH AT BEDTIME   VITAMIN D PO Take by mouth.          Objective:    BP 101/62   Pulse 82   Temp 99.8 F (37.7 C) (Oral)   Ht 5' 1.89" (1.572 m)   Wt 196 lb 3.2 oz (89 kg)   LMP 05/26/2014   BMI 36.01 kg/m   Allergies  Allergen Reactions  . Sulfa Antibiotics Hives  . Tequin Swelling    Swelling of tongue  . Codeine  Itching  . Vicodin [Hydrocodone-Acetaminophen] Other (See Comments)    Hallucinations     Wt Readings from Last 3 Encounters:  10/06/18 196 lb 3.2 oz (89 kg)  09/04/18 197 lb 3.2 oz (89.4 kg)  03/03/18 185 lb (83.9 kg)    Physical Exam Constitutional:      Appearance: She is well-developed.  HENT:     Head: Normocephalic and atraumatic.  Eyes:     Conjunctiva/sclera: Conjunctivae normal.     Pupils: Pupils are equal, round, and reactive to light.  Cardiovascular:     Rate and Rhythm: Normal rate and regular rhythm.     Heart sounds: Normal heart sounds.  Pulmonary:     Effort: Pulmonary effort is normal.     Breath sounds: Normal breath sounds.  Abdominal:     General: Bowel sounds are normal.     Palpations: Abdomen is soft.  Skin:    General: Skin is warm and dry.     Findings: No rash.  Neurological:     Mental Status: She is alert and oriented to person, place, and time.     Deep Tendon Reflexes: Reflexes are normal and symmetric.  Psychiatric:        Behavior: Behavior normal.        Thought Content: Thought content normal.        Judgment: Judgment normal.     Results for orders placed or performed in visit on 10/03/18  CMP14+EGFR  Result Value Ref Range   Glucose 113 (H) 65 - 99 mg/dL   BUN 11 6 - 24 mg/dL   Creatinine, Ser 1.07 (H) 0.57 - 1.00 mg/dL   GFR calc non Af Amer 58 (L) >59 mL/min/1.73   GFR calc Af Amer 67 >59 mL/min/1.73   BUN/Creatinine Ratio 10 9 - 23   Sodium 143 134 - 144 mmol/L   Potassium 4.7 3.5 - 5.2 mmol/L   Chloride 105 96 - 106 mmol/L   CO2 22 20 - 29 mmol/L   Calcium 9.5 8.7 - 10.2 mg/dL   Total Protein 6.8 6.0 - 8.5 g/dL   Albumin 4.4 3.8 - 4.9 g/dL   Globulin, Total 2.4 1.5 - 4.5 g/dL   Albumin/Globulin Ratio 1.8 1.2 - 2.2   Bilirubin Total 0.4 0.0 - 1.2 mg/dL   Alkaline Phosphatase 97 39 - 117 IU/L   AST 29 0 - 40 IU/L   ALT 22 0 - 32 IU/L  CBC with Differential/Platelet  Result Value Ref Range   WBC 6.5 3.4 - 10.8  x10E3/uL  RBC 4.45 3.77 - 5.28 x10E6/uL   Hemoglobin 13.9 11.1 - 15.9 g/dL   Hematocrit 41.9 34.0 - 46.6 %   MCV 94 79 - 97 fL   MCH 31.2 26.6 - 33.0 pg   MCHC 33.2 31.5 - 35.7 g/dL   RDW 13.1 11.7 - 15.4 %   Platelets 251 150 - 450 x10E3/uL   Neutrophils 60 Not Estab. %   Lymphs 29 Not Estab. %   Monocytes 7 Not Estab. %   Eos 3 Not Estab. %   Basos 1 Not Estab. %   Neutrophils Absolute 3.8 1.4 - 7.0 x10E3/uL   Lymphocytes Absolute 1.9 0.7 - 3.1 x10E3/uL   Monocytes Absolute 0.5 0.1 - 0.9 x10E3/uL   EOS (ABSOLUTE) 0.2 0.0 - 0.4 x10E3/uL   Basophils Absolute 0.1 0.0 - 0.2 x10E3/uL   Immature Granulocytes 0 Not Estab. %   Immature Grans (Abs) 0.0 0.0 - 0.1 x10E3/uL  Lipid panel  Result Value Ref Range   Cholesterol, Total 208 (H) 100 - 199 mg/dL   Triglycerides 90 0 - 149 mg/dL   HDL 94 >39 mg/dL   VLDL Cholesterol Cal 18 5 - 40 mg/dL   LDL Calculated 96 0 - 99 mg/dL   Chol/HDL Ratio 2.2 0.0 - 4.4 ratio  Thyroid Panel With TSH  Result Value Ref Range   TSH 1.340 0.450 - 4.500 uIU/mL   T4, Total 5.7 4.5 - 12.0 ug/dL   T3 Uptake Ratio 25 24 - 39 %   Free Thyroxine Index 1.4 1.2 - 4.9      Assessment & Plan:   1. Hyperglycemia - Hemoglobin A1c  2. Weight gain - buPROPion (WELLBUTRIN XL) 300 MG 24 hr tablet; Take 1 tablet (300 mg total) by mouth daily.  Dispense: 90 tablet; Refill: 3  3. Depression, major, single episode, complete remission (HCC) - buPROPion (WELLBUTRIN XL) 300 MG 24 hr tablet; Take 1 tablet (300 mg total) by mouth daily.  Dispense: 90 tablet; Refill: 3  4. Hypothyroidism, unspecified type - levothyroxine (SYNTHROID, LEVOTHROID) 125 MCG tablet; Take 1 tablet (125 mcg total) by mouth daily.  Dispense: 90 tablet; Refill: 3   Continue all other maintenance medications as listed above.  Follow up plan: Recheck 6 months  Educational handout given for Laie PA-C [facility] [address]  [place], [place]  [address] [phone]   10/06/2018, 8:19 AM

## 2018-10-11 DIAGNOSIS — R87610 Atypical squamous cells of undetermined significance on cytologic smear of cervix (ASC-US): Secondary | ICD-10-CM

## 2018-10-11 HISTORY — DX: Atypical squamous cells of undetermined significance on cytologic smear of cervix (ASC-US): R87.610

## 2018-10-15 ENCOUNTER — Encounter: Payer: Self-pay | Admitting: Gynecology

## 2018-10-15 ENCOUNTER — Ambulatory Visit (INDEPENDENT_AMBULATORY_CARE_PROVIDER_SITE_OTHER): Payer: BLUE CROSS/BLUE SHIELD | Admitting: Gynecology

## 2018-10-15 VITALS — BP 120/74 | Ht 62.0 in | Wt 192.0 lb

## 2018-10-15 DIAGNOSIS — R87610 Atypical squamous cells of undetermined significance on cytologic smear of cervix (ASC-US): Secondary | ICD-10-CM | POA: Diagnosis not present

## 2018-10-15 DIAGNOSIS — Z1151 Encounter for screening for human papillomavirus (HPV): Secondary | ICD-10-CM

## 2018-10-15 DIAGNOSIS — Z01419 Encounter for gynecological examination (general) (routine) without abnormal findings: Secondary | ICD-10-CM

## 2018-10-15 IMAGING — CT CT 3D INDEPENDENT WKST
1 of 3 series · 1 of 14 positions shown · non-contrast
Comparison: Radiographs 03/03/2018.

CLINICAL DATA: Left elbow pain and swelling after falling down
steps 03/02/2018.

EXAM:
CT OF THE UPPER LEFT EXTREMITY WITHOUT CONTRAST
3-DIMENSIONAL CT IMAGE RENDERING ON INDEPENDENT WORKSTATION
TECHNIQUE: Multidetector CT imaging of the upper left extremity was performed
according to the standard protocol. 3-dimensional CT images were
rendered by post-processing of the original CT data on an
independent workstation. The 3-dimensional CT images were
interpreted and findings were reported in the accompanying complete
CT report for this study

[Series 201: — · 0.59mm/px · 1 of 18 slices shown]
[im 11/18]
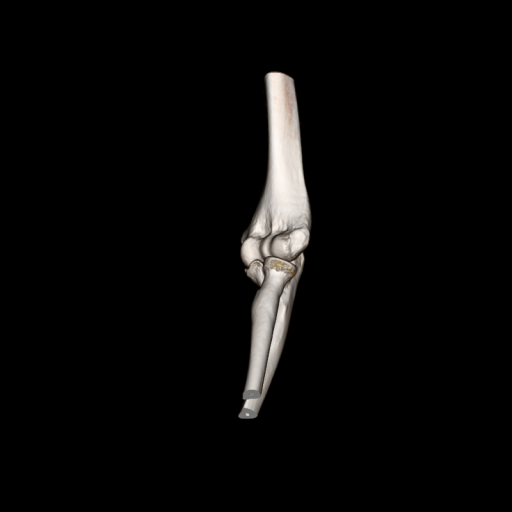

[1 of 14 positions shown; findings below may reference images not displayed]

FINDINGS: Bones/Joint/Cartilage

Nondisplaced intra-articular fracture of the radial head again noted
without significant cortical step-off at the articular surface. This
is most obvious on axial images 45-48 of series 4. Mild irregularity
of the coronoid process (best seen on coronal image [DATE]) is favored
to reflect fragmented spurring. No other definite acute osseous
findings. There is a moderate-sized elbow joint effusion without
evidence of intra-articular loose body.

Ligaments

Suboptimally assessed by CT.

Muscles and Tendons

The biceps and triceps tendons appear intact. No focal muscular
abnormalities are seen.

Soft tissues

Mild dorsal subcutaneous edema without focal fluid collection.
IMPRESSION: 1. Nondisplaced intra-articular fracture of the radial head without
significant step-off of the articular surface.
2. Moderate size hemarthrosis without evidence of intra-articular
loose body.
3. Spurring of the coronoid process without definite other acute
osseous findings.

## 2018-10-15 MED ORDER — FLUOXETINE HCL 20 MG PO CAPS: 20.0000 mg | ORAL_CAPSULE | Freq: Every day | ORAL | 4 refills | Status: DC

## 2018-10-15 MED ORDER — FLUOXETINE HCL 10 MG PO CAPS: 10.0000 mg | ORAL_CAPSULE | Freq: Every day | ORAL | 4 refills | Status: DC

## 2018-10-15 NOTE — Progress Notes (Signed)
    Carol Chambers 15-Sep-1960 [id number]        58 y.o.  G1P0010 for annual gynecologic exam.  Still having recurrent vaginal discharge that will come and last several days and then resolve on its own.  She has been treated for yeast and BV in the past.  Currently without symptoms.  Past medical history,surgical history, problem list, medications, allergies, family history and social history were all reviewed and documented as reviewed in the EPIC chart.  ROS:  Performed with pertinent positives and negatives included in the history, assessment and plan.   Additional significant findings : None   Exam: [name] assistant Vitals:   10/15/18 0850  BP: 120/74  Weight: 192 lb (87.1 kg)  Height: 5\' 2"  (1.575 m)   Body mass index is 35.12 kg/m.  General appearance:  Normal affect, orientation and appearance. Skin: Grossly normal HEENT: Without gross lesions.  No cervical or supraclavicular adenopathy. Thyroid normal.  Lungs:  Clear without wheezing, rales or rhonchi Cardiac: RR, without RMG Abdominal:  Soft, nontender, without masses, guarding, rebound, organomegaly or hernia Breasts:  Examined lying and sitting without masses, retractions, discharge or axillary adenopathy. Pelvic:  Ext, BUS, Vagina: Normal  Cervix: Normal.  Pap smear/HPV  Uterus: Anteverted, normal size, shape and contour, midline and mobile nontender   Adnexa: Without masses or tenderness    Anus and perineum: Normal   Rectovaginal: Normal sphincter tone without palpated masses or tenderness.    Assessment/Plan:  58 y.o. G60P0010 female for annual gynecologic exam.   1. Postmenopausal.  No significant menopausal symptoms or any vaginal bleeding. 2. Recurrent vaginal discharge that lasts for several days then clears.  Discussed use of boric acid vaginal suppositories 600 mg once a week to see if this does not change the environment and prevent recurrent vaginitis.  Patient wants to go ahead and try this and will  buy these OTC.  We will follow-up if continues to be an issue. 3. Anxiety.  On fluoxetine 30 mg daily with good results.  No side effects reported.  Refill of 20 mg and 10 mg tablets x1 year provided. 4. Pap smear/HPV 09/2014.  Pap smear/HPV today as she would exceed 5-year interval if waited till next year.  No history of abnormal Pap smears previously. 5. DEXA never.  Will plan further into the menopause. 6. Mammography 01/2018.  Continue with annual mammography when due.  Breast exam normal today. 7. Colonoscopy 2016.  Repeat at their recommended interval. 8. Health maintenance.  No routine lab work done as patient does this elsewhere.  Follow-up 1 year, sooner as needed.   Anastasio Auerbach MD, 9:12 AM 10/15/2018

## 2018-10-15 NOTE — Addendum Note (Signed)
Addended by: Nelva Nay on: 10/15/2018 10:14 AM   Modules accepted: Orders

## 2018-10-15 NOTE — Patient Instructions (Addendum)
Consider boric acid vaginal suppositories 600 mg once weekly in the vagina to help prevent recurrent vaginal discharge.  You can obtain these over-the-counter.  Follow-up in 1 year for annual exam, sooner if any issues.

## 2018-10-16 LAB — PAP IG AND HPV HIGH-RISK: HPV DNA HIGH RISK: NOT DETECTED

## 2018-10-19 ENCOUNTER — Encounter: Payer: Self-pay | Admitting: Gynecology

## 2018-10-31 ENCOUNTER — Other Ambulatory Visit: Payer: Self-pay | Admitting: Physician Assistant

## 2018-11-06 ENCOUNTER — Other Ambulatory Visit: Payer: Self-pay | Admitting: Physician Assistant

## 2018-11-14 ENCOUNTER — Other Ambulatory Visit: Payer: Self-pay | Admitting: Physician Assistant

## 2018-12-15 ENCOUNTER — Other Ambulatory Visit: Payer: Self-pay | Admitting: Allergy and Immunology

## 2018-12-16 ENCOUNTER — Other Ambulatory Visit: Payer: Self-pay | Admitting: Allergy and Immunology

## 2019-01-06 ENCOUNTER — Other Ambulatory Visit: Payer: Self-pay | Admitting: Allergy and Immunology

## 2019-01-06 NOTE — Telephone Encounter (Signed)
Courtesy refill  

## 2019-01-28 ENCOUNTER — Other Ambulatory Visit: Payer: Self-pay | Admitting: Allergy and Immunology

## 2019-03-10 ENCOUNTER — Other Ambulatory Visit: Payer: Self-pay | Admitting: Physician Assistant

## 2019-03-16 ENCOUNTER — Other Ambulatory Visit: Payer: Self-pay

## 2019-03-16 ENCOUNTER — Encounter: Payer: Self-pay | Admitting: Allergy and Immunology

## 2019-03-16 ENCOUNTER — Ambulatory Visit (INDEPENDENT_AMBULATORY_CARE_PROVIDER_SITE_OTHER): Payer: BC Managed Care – PPO | Admitting: Allergy and Immunology

## 2019-03-16 VITALS — BP 118/58 | HR 76 | Temp 97.9°F | Resp 16

## 2019-03-16 DIAGNOSIS — J4540 Moderate persistent asthma, uncomplicated: Secondary | ICD-10-CM | POA: Diagnosis not present

## 2019-03-16 DIAGNOSIS — J3089 Other allergic rhinitis: Secondary | ICD-10-CM

## 2019-03-16 DIAGNOSIS — K219 Gastro-esophageal reflux disease without esophagitis: Secondary | ICD-10-CM | POA: Diagnosis not present

## 2019-03-16 MED ORDER — BUDESONIDE-FORMOTEROL FUMARATE 160-4.5 MCG/ACT IN AERO: 2.0000 | INHALATION_SPRAY | Freq: Two times a day (BID) | RESPIRATORY_TRACT | 5 refills | Status: DC

## 2019-03-16 MED ORDER — MONTELUKAST SODIUM 10 MG PO TABS: 10.0000 mg | ORAL_TABLET | Freq: Every day | ORAL | 1 refills | Status: DC

## 2019-03-16 NOTE — Patient Instructions (Addendum)
  1.  Continue to perform Allergen avoidance measures  2.  Continue to Treat and prevent inflammation:   A.  OTC Nasocort - 1 spray each nostril 3-7 times a week  B.  Montelukast 10 mg - 1 tablet 1 time per day   3.  Continue to treat reflux:   A.  Consolidate caffeine as much as possible  B.  omeprazole 40 mg tablet 1 time per day  4. If needed:   A.  Nasal saline multiple times a day  B.  OTC cetirizine 10-20 mg 1 time per day  C.  Pro Air HFA or similar 2 inhalations every 4-6 hours  D.  OTC Mucinex DM 2 tablets twice a day   5. Obtain fall flu vaccine (and COVID vaccine)  6.  Can restart Symbicort 160 -2 inhalations 1-2 times per day with spacer depending on asthma disease activity.  7. Return to clinic in January 2021 or earlier if problem

## 2019-03-16 NOTE — Progress Notes (Signed)
[facility] [place] [facility] [place]   Follow-up Note  Referring Provider: Terald Sleeper, PA-C Primary Provider: Theodoro Clock Date of Office Visit: 03/16/2019  Subjective:   Carol Chambers (DOB: 05/31/1961) is a 58 y.o. female who returns to the [facility] on 03/16/2019 in re-evaluation of the following:  HPI: Carol Chambers returns to this clinic in reevaluation of her asthma and allergic rhinitis and LPR.  Her last visit to this clinic was 19 May 2018.  She has really done well since her last visit.  She has tapered off her Symbicort and relies on the use of a nasal steroid a few times a week and a leukotriene modifier to control her upper and lower airway disease.  She has not required a systemic steroid or an antibiotic for any type of airway issue.  She rarely uses a short acting bronchodilator and she can exercise without any problem.  Her throat is doing much better at this point in time since she started omeprazole and has completely eliminated use of caffeine.  Allergies as of 03/16/2019      Reactions   Sulfa Antibiotics Hives   Tequin Swelling   Swelling of tongue   Codeine Itching   Vicodin [hydrocodone-acetaminophen] Other (See Comments)   Hallucinations      Medication List      albuterol 108 (90 Base) MCG/ACT inhaler Commonly known as: ProAir HFA Inhale 2 puffs into the lungs every 4 (four) hours as needed for wheezing or shortness of breath.   budesonide-formoterol 160-4.5 MCG/ACT inhaler Commonly known as: Symbicort Inhale 2 puffs into the lungs 2 (two) times daily.   buPROPion 300 MG 24 hr tablet Commonly known as: Wellbutrin XL Take 1 tablet (300 mg total) by mouth daily.   cyclobenzaprine 10 MG tablet Commonly known as: FLEXERIL TAKE 1 TABLET BY MOUTH THREE TIMES DAILY AS NEEDED FOR MUSCLE SPASMS   FLUoxetine 10 MG capsule Commonly known as: PROZAC Take 1 capsule (10 mg total) by mouth daily.    FLUoxetine 20 MG capsule Commonly known as: PROZAC Take 1 capsule (20 mg total) by mouth daily.   GNP Magnesium 250 MG Tabs Generic drug: Magnesium Take 2 tablets by mouth daily.   levothyroxine 125 MCG tablet Commonly known as: SYNTHROID Take 1 tablet (125 mcg total) by mouth daily.   montelukast 10 MG tablet Commonly known as: SINGULAIR TAKE 1 TABLET BY MOUTH AT BEDTIME. APPOINTMENT NEEDED FOR FURTHER REFILLS   NASACORT AQ NA Place 1 spray into the nose 2 (two) times daily.   omeprazole 40 MG capsule Commonly known as: PRILOSEC TAKE ONE CAPSULE BY MOUTH DAILY   Restasis 0.05 % ophthalmic emulsion Generic drug: cycloSPORINE Restasis 0.05 % eye drops in a dropperette   simvastatin 20 MG tablet Commonly known as: ZOCOR TAKE 1 TABLET BY MOUTH AT BEDTIME   VITAMIN D PO Take by mouth.       Past Medical History:  Diagnosis Date  . Acid reflux   . Anemia   . ASCUS of cervix with negative high risk HPV 10/2018  . Asthma   . History of kidney stones   . Hypercholesteremia   . Hypothyroidism   . PONV (postoperative nausea and vomiting)    with Cholecystectomy  . Seasonal allergies     Past Surgical History:  Procedure Laterality Date  . BRAVO [place] STUDY N/A 08/23/2014   Procedure: BRAVO [place] STUDY;  Surgeon: Inda Castle,  MD;  Location: [facility];  Service: Endoscopy;  Laterality: N/A;  48 hour bravo pH study with impedance plethysmography   . BRAVO [place] STUDY N/A 10/10/2014   Procedure: BRAVO [place] STUDY;  Surgeon: Inda Castle, MD;  Location: [facility];  Service: Endoscopy;  Laterality: N/A;  2nd attempt  . CARPAL TUNNEL RELEASE  2003   bilat  . ESOPHAGOGASTRODUODENOSCOPY (EGD) WITH PROPOFOL N/A 08/23/2014   Procedure: ESOPHAGOGASTRODUODENOSCOPY (EGD) WITH PROPOFOL;  Surgeon: Inda Castle, MD;  Location: [facility];  Service: Endoscopy;  Laterality: N/A;  . ESOPHAGOGASTRODUODENOSCOPY (EGD) WITH PROPOFOL N/A 10/10/2014   Procedure:  ESOPHAGOGASTRODUODENOSCOPY (EGD) WITH PROPOFOL;  Surgeon: Inda Castle, MD;  Location: [facility];  Service: Endoscopy;  Laterality: N/A;  . Esophagus stretched     X 2  . FOOT SURGERY     03-24-14 Plantar Fasciatitis  . FOOT SURGERY Left    foot surgery 09-29-14  . groin cyst removed  2000   right  . LAPAROSCOPIC CHOLECYSTECTOMY  2003    Review of systems negative except as noted in HPI / PMHx or noted below:  Review of Systems  Constitutional: Negative.   HENT: Negative.   Eyes: Negative.   Respiratory: Negative.   Cardiovascular: Negative.   Gastrointestinal: Negative.   Genitourinary: Negative.   Musculoskeletal: Negative.   Skin: Negative.   Neurological: Negative.   Endo/Heme/Allergies: Negative.   Psychiatric/Behavioral: Negative.      Objective:   Vitals:   03/16/19 1523  BP: (!) 118/58  Pulse: 76  Resp: 16  Temp: 97.9 F (36.6 C)  SpO2: 96%          Physical Exam Constitutional:      Appearance: She is not diaphoretic.  HENT:     Head: Normocephalic.     Right Ear: Tympanic membrane, ear canal and external ear normal.     Left Ear: Tympanic membrane, ear canal and external ear normal.     Nose: Nose normal. No mucosal edema or rhinorrhea.     Mouth/Throat:     Pharynx: Uvula midline. No oropharyngeal exudate.  Eyes:     Conjunctiva/sclera: Conjunctivae normal.  Neck:     Thyroid: No thyromegaly.     Trachea: Trachea normal. No tracheal tenderness or tracheal deviation.  Cardiovascular:     Rate and Rhythm: Normal rate and regular rhythm.     Heart sounds: Normal heart sounds, S1 normal and S2 normal. No murmur.  Pulmonary:     Effort: No respiratory distress.     Breath sounds: Normal breath sounds. No stridor. No wheezing or rales.  Lymphadenopathy:     Head:     Right side of head: No tonsillar adenopathy.     Left side of head: No tonsillar adenopathy.     Cervical: No cervical adenopathy.  Skin:    Findings: No erythema or rash.      Nails: There is no clubbing.   Neurological:     Mental Status: She is alert.     Diagnostics:    Spirometry was performed and demonstrated an FEV1 of 2.02 at 82 % of predicted.  The patient had an Asthma Control Test with the following results: ACT Total Score: 24.    Assessment and Plan:   1. Asthma, moderate persistent, well-controlled   2. Other allergic rhinitis   3. LPRD (laryngopharyngeal reflux disease)     1.  Continue to perform Allergen avoidance measures  2.  Continue to Treat and prevent inflammation:  A.  OTC Nasocort - 1 spray each nostril 3-7 times a week  B.  Montelukast 10 mg - 1 tablet 1 time per day   3.  Continue to treat reflux:   A.  Consolidate caffeine as much as possible  B.  omeprazole 40 mg tablet 1 time per day  4. If needed:   A.  Nasal saline multiple times a day  B.  OTC cetirizine 10-20 mg 1 time per day  C.  Pro Air HFA or similar 2 inhalations every 4-6 hours  D.  OTC Mucinex DM 2 tablets twice a day   5. Obtain fall flu vaccine (and COVID vaccine)  6.  Can restart Symbicort 160 2 inhalations 1-2 times per day with spacer depending on asthma disease activity.  7.  Return to clinic in January 2021 or earlier if problem  Raneem has really done very well on her current therapy and she will maintain control of her atopic disease with the use of Nasacort and montelukast and continue to treat reflux with omeprazole.  She has the option of reintroducing Symbicort should she develop more asthma activity in the future.  If she does well I will see her back in this clinic in 6 months or earlier if there is a problem.  Allena Katz, MD Allergy / Immunology [place]

## 2019-03-23 ENCOUNTER — Encounter: Payer: Self-pay | Admitting: Physician Assistant

## 2019-04-05 ENCOUNTER — Other Ambulatory Visit: Payer: Self-pay

## 2019-04-05 ENCOUNTER — Telehealth: Payer: Self-pay | Admitting: Physician Assistant

## 2019-04-05 ENCOUNTER — Encounter: Payer: Self-pay | Admitting: Physician Assistant

## 2019-04-06 ENCOUNTER — Encounter: Payer: Self-pay | Admitting: Physician Assistant

## 2019-04-06 ENCOUNTER — Ambulatory Visit: Payer: BC Managed Care – PPO | Admitting: Physician Assistant

## 2019-04-06 VITALS — BP 95/62 | HR 81 | Temp 97.8°F | Ht 62.0 in | Wt 191.2 lb

## 2019-04-06 DIAGNOSIS — E785 Hyperlipidemia, unspecified: Secondary | ICD-10-CM

## 2019-04-06 DIAGNOSIS — E039 Hypothyroidism, unspecified: Secondary | ICD-10-CM

## 2019-04-06 DIAGNOSIS — R635 Abnormal weight gain: Secondary | ICD-10-CM

## 2019-04-06 DIAGNOSIS — Z0000 Encounter for general adult medical examination without abnormal findings: Secondary | ICD-10-CM

## 2019-04-06 LAB — BAYER DCA HB A1C WAIVED: HB A1C (BAYER DCA - WAIVED): 5.6 % (ref <7.0)

## 2019-04-06 MED ORDER — TOPIRAMATE 50 MG PO TABS: 50.0000 mg | ORAL_TABLET | Freq: Two times a day (BID) | ORAL | 2 refills | Status: DC

## 2019-04-07 LAB — CBC WITH DIFFERENTIAL/PLATELET
Basophils Absolute: 0.1 10*3/uL (ref 0.0–0.2)
Basos: 1 %
EOS (ABSOLUTE): 0.1 10*3/uL (ref 0.0–0.4)
Eos: 2 %
Hematocrit: 43.9 % (ref 34.0–46.6)
Hemoglobin: 14.6 g/dL (ref 11.1–15.9)
Immature Grans (Abs): 0 10*3/uL (ref 0.0–0.1)
Immature Granulocytes: 0 %
Lymphocytes Absolute: 2.1 10*3/uL (ref 0.7–3.1)
Lymphs: 34 %
MCH: 30.8 pg (ref 26.6–33.0)
MCHC: 33.3 g/dL (ref 31.5–35.7)
MCV: 93 fL (ref 79–97)
Monocytes Absolute: 0.6 10*3/uL (ref 0.1–0.9)
Monocytes: 10 %
Neutrophils Absolute: 3.2 10*3/uL (ref 1.4–7.0)
Neutrophils: 53 %
Platelets: 257 10*3/uL (ref 150–450)
RBC: 4.74 x10E6/uL (ref 3.77–5.28)
RDW: 11.8 % (ref 11.7–15.4)
WBC: 6 10*3/uL (ref 3.4–10.8)

## 2019-04-07 LAB — CMP14+EGFR
ALT: 18 IU/L (ref 0–32)
AST: 19 IU/L (ref 0–40)
Albumin/Globulin Ratio: 2 (ref 1.2–2.2)
Albumin: 4.4 g/dL (ref 3.8–4.9)
Alkaline Phosphatase: 94 IU/L (ref 39–117)
BUN/Creatinine Ratio: 13 (ref 9–23)
BUN: 13 mg/dL (ref 6–24)
Bilirubin Total: 0.4 mg/dL (ref 0.0–1.2)
CO2: 24 mmol/L (ref 20–29)
Calcium: 9.5 mg/dL (ref 8.7–10.2)
Chloride: 104 mmol/L (ref 96–106)
Creatinine, Ser: 1 mg/dL (ref 0.57–1.00)
GFR calc Af Amer: 73 mL/min/{1.73_m2} (ref >59)
GFR calc non Af Amer: 63 mL/min/{1.73_m2} (ref >59)
Globulin, Total: 2.2 g/dL (ref 1.5–4.5)
Glucose: 106 mg/dL — ABNORMAL HIGH (ref 65–99)
Potassium: 4.6 mmol/L (ref 3.5–5.2)
Sodium: 143 mmol/L (ref 134–144)
Total Protein: 6.6 g/dL (ref 6.0–8.5)

## 2019-04-07 LAB — THYROID PANEL WITH TSH
Free Thyroxine Index: 2.1 (ref 1.2–4.9)
T3 Uptake Ratio: 29 % (ref 24–39)
T4, Total: 7.2 ug/dL (ref 4.5–12.0)
TSH: 0.039 u[IU]/mL — ABNORMAL LOW (ref 0.450–4.500)

## 2019-04-07 LAB — LIPID PANEL
Chol/HDL Ratio: 2.4 ratio (ref 0.0–4.4)
Cholesterol, Total: 182 mg/dL (ref 100–199)
HDL: 77 mg/dL (ref >39)
LDL Calculated: 86 mg/dL (ref 0–99)
Triglycerides: 94 mg/dL (ref 0–149)
VLDL Cholesterol Cal: 19 mg/dL (ref 5–40)

## 2019-04-07 LAB — VITAMIN D 25 HYDROXY (VIT D DEFICIENCY, FRACTURES): Vit D, 25-Hydroxy: 55.3 ng/mL (ref 30.0–100.0)

## 2019-04-08 ENCOUNTER — Encounter: Payer: Self-pay | Admitting: Physician Assistant

## 2019-04-11 NOTE — Progress Notes (Signed)
BP 95/62   Pulse 81   Temp 97.8 F (36.6 C) (Temporal)   Ht '5\' 2"'$  (1.575 m)   Wt 191 lb 3.2 oz (86.7 kg)   LMP 05/26/2014   BMI 34.97 kg/m    Subjective:    Patient ID: Carol Chambers, female    DOB: 11-01-1960, 58 y.o.   MRN: [id number]  HPI: Carol Chambers is a 58 y.o. female presenting on 04/06/2019 for Hypothyroidism (6 month follow up )  This patient comes in for periodic follow-up on her chronic medical conditions.  She states that she does need labs performed.  She was concerned about her thyroid may be being off some.  She has had difficulty with seeing in good control lately.  She also has had a great increase in fatigue, cold intolerance, weight gain without trying.  We have discussed dietary medications that can be used.  And she is willing to try Topamax at this time.  Past Medical History:  Diagnosis Date  . Acid reflux   . Anemia   . ASCUS of cervix with negative high risk HPV 10/2018  . Asthma   . History of kidney stones   . Hypercholesteremia   . Hypothyroidism   . PONV (postoperative nausea and vomiting)    with Cholecystectomy  . Seasonal allergies    Relevant past medical, surgical, family and social history reviewed and updated as indicated. Interim medical history since our last visit reviewed. Allergies and medications reviewed and updated. DATA REVIEWED: CHART IN EPIC  Family History reviewed for pertinent findings.  Review of Systems  Constitutional: Positive for fatigue and unexpected weight change. Negative for activity change and fever.  HENT: Negative.   Eyes: Negative.   Respiratory: Negative.  Negative for cough.   Cardiovascular: Negative.  Negative for chest pain.  Gastrointestinal: Negative.  Negative for abdominal pain.  Endocrine: Negative.   Genitourinary: Negative.  Negative for dysuria.  Musculoskeletal: Negative.   Skin: Negative.   Neurological: Negative.     Allergies as of 04/06/2019      Reactions   Sulfa Antibiotics  Hives   Tequin Swelling   Swelling of tongue   Codeine Itching   Vicodin [hydrocodone-acetaminophen] Other (See Comments)   Hallucinations      Medication List       Accurate as of April 06, 2019 11:59 PM. If you have any questions, ask your nurse or doctor.        albuterol 108 (90 Base) MCG/ACT inhaler Commonly known as: ProAir HFA Inhale 2 puffs into the lungs every 4 (four) hours as needed for wheezing or shortness of breath.   budesonide-formoterol 160-4.5 MCG/ACT inhaler Commonly known as: Symbicort Inhale 2 puffs into the lungs 2 (two) times daily.   buPROPion 300 MG 24 hr tablet Commonly known as: Wellbutrin XL Take 1 tablet (300 mg total) by mouth daily.   cyclobenzaprine 10 MG tablet Commonly known as: FLEXERIL TAKE 1 TABLET BY MOUTH THREE TIMES DAILY AS NEEDED FOR MUSCLE SPASMS   FLUoxetine 10 MG capsule Commonly known as: PROZAC Take 1 capsule (10 mg total) by mouth daily.   FLUoxetine 20 MG capsule Commonly known as: PROZAC Take 1 capsule (20 mg total) by mouth daily.   GNP Magnesium 250 MG Tabs Generic drug: Magnesium Take 2 tablets by mouth daily.   levothyroxine 125 MCG tablet Commonly known as: SYNTHROID Take 1 tablet (125 mcg total) by mouth daily.   montelukast 10 MG tablet Commonly known as:  SINGULAIR Take 1 tablet (10 mg total) by mouth at bedtime.   NASACORT AQ NA Place 1 spray into the nose 2 (two) times daily.   omeprazole 40 MG capsule Commonly known as: PRILOSEC TAKE ONE CAPSULE BY MOUTH DAILY   Restasis 0.05 % ophthalmic emulsion Generic drug: cycloSPORINE Restasis 0.05 % eye drops in a dropperette   simvastatin 20 MG tablet Commonly known as: ZOCOR TAKE 1 TABLET BY MOUTH AT BEDTIME   topiramate 50 MG tablet Commonly known as: Topamax Take 1 tablet (50 mg total) by mouth 2 (two) times daily. Started by: Terald Sleeper, PA-C   VITAMIN D PO Take by mouth.          Objective:    BP 95/62   Pulse 81   Temp 97.8  F (36.6 C) (Temporal)   Ht _0  (1.575 m)   Wt 191 lb 3.2 oz (86.7 kg)   LMP 05/26/2014   BMI 34.97 kg/m   Allergies  Allergen Reactions  . Sulfa Antibiotics Hives  . Tequin Swelling    Swelling of tongue  . Codeine Itching  . Vicodin [Hydrocodone-Acetaminophen] Other (See Comments)    Hallucinations     Wt Readings from Last 3 Encounters:  04/06/19 191 lb 3.2 oz (86.7 kg)  10/15/18 192 lb (87.1 kg)  10/06/18 196 lb 3.2 oz (89 kg)    Physical Exam Constitutional:      General: She is not in acute distress.    Appearance: Normal appearance. She is well-developed.  HENT:     Head: Normocephalic and atraumatic.  Cardiovascular:     Rate and Rhythm: Normal rate.  Pulmonary:     Effort: Pulmonary effort is normal.  Skin:    General: Skin is warm and dry.     Findings: No rash.  Neurological:     Mental Status: She is alert and oriented to person, place, and time.     Deep Tendon Reflexes: Reflexes are normal and symmetric.     Results for orders placed or performed in visit on 04/06/19  CBC with Differential/Platelet  Result Value Ref Range   WBC 6.0 3.4 - 10.8 x10E3/uL   RBC 4.74 3.77 - 5.28 x10E6/uL   Hemoglobin 14.6 11.1 - 15.9 g/dL   Hematocrit 43.9 34.0 - 46.6 %   MCV 93 79 - 97 fL   MCH 30.8 26.6 - 33.0 pg   MCHC 33.3 31.5 - 35.7 g/dL   RDW 11.8 11.7 - 15.4 %   Platelets 257 150 - 450 x10E3/uL   Neutrophils 53 Not Estab. %   Lymphs 34 Not Estab. %   Monocytes 10 Not Estab. %   Eos 2 Not Estab. %   Basos 1 Not Estab. %   Neutrophils Absolute 3.2 1.4 - 7.0 x10E3/uL   Lymphocytes Absolute 2.1 0.7 - 3.1 x10E3/uL   Monocytes Absolute 0.6 0.1 - 0.9 x10E3/uL   EOS (ABSOLUTE) 0.1 0.0 - 0.4 x10E3/uL   Basophils Absolute 0.1 0.0 - 0.2 x10E3/uL   Immature Granulocytes 0 Not Estab. %   Immature Grans (Abs) 0.0 0.0 - 0.1 x10E3/uL  CMP14+EGFR  Result Value Ref Range   Glucose 106 (H) 65 - 99 mg/dL   BUN 13 6 - 24 mg/dL   Creatinine, Ser 1.00 0.57 - 1.00  mg/dL   GFR calc non Af Amer 63 >59 mL/min/1.73   GFR calc Af Amer 73 >59 mL/min/1.73   BUN/Creatinine Ratio 13 9 - 23   Sodium 143 134 -  144 mmol/L   Potassium 4.6 3.5 - 5.2 mmol/L   Chloride 104 96 - 106 mmol/L   CO2 24 20 - 29 mmol/L   Calcium 9.5 8.7 - 10.2 mg/dL   Total Protein 6.6 6.0 - 8.5 g/dL   Albumin 4.4 3.8 - 4.9 g/dL   Globulin, Total 2.2 1.5 - 4.5 g/dL   Albumin/Globulin Ratio 2.0 1.2 - 2.2   Bilirubin Total 0.4 0.0 - 1.2 mg/dL   Alkaline Phosphatase 94 39 - 117 IU/L   AST 19 0 - 40 IU/L   ALT 18 0 - 32 IU/L  Lipid panel  Result Value Ref Range   Cholesterol, Total 182 100 - 199 mg/dL   Triglycerides 94 0 - 149 mg/dL   HDL 77 >39 mg/dL   VLDL Cholesterol Cal 19 5 - 40 mg/dL   LDL Calculated 86 0 - 99 mg/dL   Chol/HDL Ratio 2.4 0.0 - 4.4 ratio  Thyroid Panel With TSH  Result Value Ref Range   TSH 0.039 (L) 0.450 - 4.500 uIU/mL   T4, Total 7.2 4.5 - 12.0 ug/dL   T3 Uptake Ratio 29 24 - 39 %   Free Thyroxine Index 2.1 1.2 - 4.9  Bayer DCA Hb A1c Waived  Result Value Ref Range   HB A1C (BAYER DCA - WAIVED) 5.6 <7.0 %  VITAMIN D 25 Hydroxy (Vit-D Deficiency, Fractures)  Result Value Ref Range   Vit D, 25-Hydroxy 55.3 30.0 - 100.0 ng/mL      Assessment & Plan:   1. Well adult exam - CBC with Differential/Platelet - CMP14+EGFR - Lipid panel - Thyroid Panel With TSH - Bayer DCA Hb A1c Waived - VITAMIN D 25 Hydroxy (Vit-D Deficiency, Fractures)  2. Hypothyroidism, unspecified type - Thyroid Panel With TSH  3. Hyperlipidemia, unspecified hyperlipidemia type - Lipid panel  4. Weight gain - topiramate (TOPAMAX) 50 MG tablet; Take 1 tablet (50 mg total) by mouth 2 (two) times daily.  Dispense: 60 tablet; Refill: 2   Continue all other maintenance medications as listed above.  Follow up plan: Return in about 2 months (around 06/06/2019).  Educational handout given for [place] PA-C [facility] [address]  [place], [place] [address] [phone]   04/11/2019, 9:08 PM

## 2019-05-06 ENCOUNTER — Encounter: Payer: Self-pay | Admitting: Physician Assistant

## 2019-05-11 ENCOUNTER — Encounter: Payer: Self-pay | Admitting: Gynecology

## 2019-05-18 ENCOUNTER — Other Ambulatory Visit: Payer: Self-pay | Admitting: Physician Assistant

## 2019-05-18 ENCOUNTER — Other Ambulatory Visit: Payer: BC Managed Care – PPO

## 2019-05-18 ENCOUNTER — Other Ambulatory Visit: Payer: Self-pay

## 2019-05-18 DIAGNOSIS — E039 Hypothyroidism, unspecified: Secondary | ICD-10-CM

## 2019-05-19 ENCOUNTER — Other Ambulatory Visit: Payer: Self-pay | Admitting: *Deleted

## 2019-05-19 DIAGNOSIS — E039 Hypothyroidism, unspecified: Secondary | ICD-10-CM

## 2019-05-19 LAB — THYROID PANEL WITH TSH
Free Thyroxine Index: 1.8 (ref 1.2–4.9)
T3 Uptake Ratio: 26 % (ref 24–39)
T4, Total: 7.1 ug/dL (ref 4.5–12.0)
TSH: 0.034 u[IU]/mL — ABNORMAL LOW (ref 0.450–4.500)

## 2019-05-19 MED ORDER — LEVOTHYROXINE SODIUM 100 MCG PO TABS: 100.0000 ug | ORAL_TABLET | Freq: Every day | ORAL | 3 refills | Status: DC

## 2019-06-07 ENCOUNTER — Ambulatory Visit: Payer: BC Managed Care – PPO | Admitting: Physician Assistant

## 2019-06-07 ENCOUNTER — Encounter: Payer: Self-pay | Admitting: Physician Assistant

## 2019-06-07 ENCOUNTER — Other Ambulatory Visit: Payer: Self-pay

## 2019-06-07 DIAGNOSIS — R635 Abnormal weight gain: Secondary | ICD-10-CM | POA: Diagnosis not present

## 2019-06-07 DIAGNOSIS — Z23 Encounter for immunization: Secondary | ICD-10-CM

## 2019-06-07 DIAGNOSIS — E039 Hypothyroidism, unspecified: Secondary | ICD-10-CM

## 2019-06-07 NOTE — Progress Notes (Signed)
BP 94/64   Pulse 76   Temp (!) 96.9 F (36.1 C) (Temporal)   Ht 5\' 2"  (1.575 m)   Wt 182 lb 6.4 oz (82.7 kg)   LMP 05/26/2014   SpO2 97%   BMI 33.36 kg/m    Subjective:    Patient ID: Carol Chambers, female    DOB: 1960/09/05, 58 y.o.   MRN: [id number]  HPI: Carol Chambers is a 58 y.o. female presenting on 06/07/2019 for weight management and Hypothyroidism  This patient comes in for recheck on her weight loss efforts.  She is taking Topamax 50 mg 1 twice daily.  She states she is tolerating it well, she only gets a dry mouth.  However this ensures that she drinks more water.  She states that it does reduce her cravings for fatigue.  She is down 9 pounds over the last 2 months. The patient had failed phentermine in the past.  She is also here for recheck on her thyroid.  While she does have known hypothyroidism and will have a lab performed today. Adjustment to her medication about a month ago was made.  Past Medical History:  Diagnosis Date  . Acid reflux   . Anemia   . ASCUS of cervix with negative high risk HPV 10/2018  . Asthma   . History of kidney stones   . Hypercholesteremia   . Hypothyroidism   . PONV (postoperative nausea and vomiting)    with Cholecystectomy  . Seasonal allergies    Relevant past medical, surgical, family and social history reviewed and updated as indicated. Interim medical history since our last visit reviewed. Allergies and medications reviewed and updated. DATA REVIEWED: CHART IN EPIC  Family History reviewed for pertinent findings.  Review of Systems  Constitutional: Negative.   HENT: Negative.   Eyes: Negative.   Respiratory: Negative.   Gastrointestinal: Negative.   Genitourinary: Negative.     Allergies as of 06/07/2019      Reactions   Sulfa Antibiotics Hives   Tequin Swelling   Swelling of tongue   Codeine Itching   Vicodin [hydrocodone-acetaminophen] Other (See Comments)   Hallucinations      Medication List       Accurate as of June 07, 2019  8:22 AM. If you have any questions, ask your nurse or doctor.        albuterol 108 (90 Base) MCG/ACT inhaler Commonly known as: ProAir HFA Inhale 2 puffs into the lungs every 4 (four) hours as needed for wheezing or shortness of breath.   budesonide-formoterol 160-4.5 MCG/ACT inhaler Commonly known as: Symbicort Inhale 2 puffs into the lungs 2 (two) times daily.   buPROPion 300 MG 24 hr tablet Commonly known as: Wellbutrin XL Take 1 tablet (300 mg total) by mouth daily.   cyclobenzaprine 10 MG tablet Commonly known as: FLEXERIL TAKE 1 TABLET BY MOUTH THREE TIMES DAILY AS NEEDED FOR MUSCLE SPASMS   FLUoxetine 10 MG capsule Commonly known as: PROZAC Take 1 capsule (10 mg total) by mouth daily.   FLUoxetine 20 MG capsule Commonly known as: PROZAC Take 1 capsule (20 mg total) by mouth daily.   GNP Magnesium 250 MG Tabs Generic drug: Magnesium Take 2 tablets by mouth daily.   levothyroxine 100 MCG tablet Commonly known as: SYNTHROID Take 1 tablet (100 mcg total) by mouth daily.   montelukast 10 MG tablet Commonly known as: SINGULAIR Take 1 tablet (10 mg total) by mouth at bedtime.   NASACORT AQ NA  Place 1 spray into the nose 2 (two) times daily.   omeprazole 40 MG capsule Commonly known as: PRILOSEC TAKE ONE CAPSULE BY MOUTH DAILY   Restasis 0.05 % ophthalmic emulsion Generic drug: cycloSPORINE Restasis 0.05 % eye drops in a dropperette   simvastatin 20 MG tablet Commonly known as: ZOCOR TAKE 1 TABLET BY MOUTH AT BEDTIME   topiramate 50 MG tablet Commonly known as: Topamax Take 1 tablet (50 mg total) by mouth 2 (two) times daily.   VITAMIN D PO Take by mouth.          Objective:    BP 94/64   Pulse 76   Temp (!) 96.9 F (36.1 C) (Temporal)   Ht 5\' 2"  (1.575 m)   Wt 182 lb 6.4 oz (82.7 kg)   LMP 05/26/2014   SpO2 97%   BMI 33.36 kg/m   Allergies  Allergen Reactions  . Sulfa Antibiotics Hives  . Tequin Swelling     Swelling of tongue  . Codeine Itching  . Vicodin [Hydrocodone-Acetaminophen] Other (See Comments)    Hallucinations     Wt Readings from Last 3 Encounters:  06/07/19 182 lb 6.4 oz (82.7 kg)  04/06/19 191 lb 3.2 oz (86.7 kg)  10/15/18 192 lb (87.1 kg)    Physical Exam Constitutional:      General: She is not in acute distress.    Appearance: Normal appearance. She is well-developed.  HENT:     Head: Normocephalic and atraumatic.  Cardiovascular:     Rate and Rhythm: Normal rate.  Pulmonary:     Effort: Pulmonary effort is normal.  Skin:    General: Skin is warm and dry.     Findings: No rash.  Neurological:     Mental Status: She is alert and oriented to person, place, and time.     Deep Tendon Reflexes: Reflexes are normal and symmetric.     Results for orders placed or performed in visit on 05/18/19  Thyroid Panel With TSH  Result Value Ref Range   TSH 0.034 (L) 0.450 - 4.500 uIU/mL   T4, Total 7.1 4.5 - 12.0 ug/dL   T3 Uptake Ratio 26 24 - 39 %   Free Thyroxine Index 1.8 1.2 - 4.9      Assessment & Plan:   1. Weight gain Continue Topamax 50 mg 1 tablet twice daily Continue diet and exercise  2. Need for immunization against influenza - Flu Vaccine QUAD 36+ mos IM  3. Hypothyroidism, unspecified type - TSH    Continue all other maintenance medications as listed above.  Follow up plan: Return in about 2 months (around 08/07/2019).  Educational handout given for [place] PA-C [facility] [address]  [place], [place] [address] [phone]   06/07/2019, 8:22 AM

## 2019-06-08 ENCOUNTER — Other Ambulatory Visit: Payer: Self-pay | Admitting: Physician Assistant

## 2019-06-08 LAB — TSH: TSH: 0.041 u[IU]/mL — ABNORMAL LOW (ref 0.450–4.500)

## 2019-06-09 ENCOUNTER — Other Ambulatory Visit: Payer: Self-pay | Admitting: Physician Assistant

## 2019-06-09 MED ORDER — LEVOTHYROXINE SODIUM 88 MCG PO TABS: 88.0000 ug | ORAL_TABLET | Freq: Every day | ORAL | 1 refills | Status: DC

## 2019-06-11 ENCOUNTER — Other Ambulatory Visit: Payer: Self-pay | Admitting: *Deleted

## 2019-06-14 ENCOUNTER — Encounter: Payer: Self-pay | Admitting: Physician Assistant

## 2019-06-15 ENCOUNTER — Encounter: Payer: Self-pay | Admitting: Gynecology

## 2019-06-16 ENCOUNTER — Encounter: Payer: Self-pay | Admitting: Physician Assistant

## 2019-07-26 ENCOUNTER — Other Ambulatory Visit: Payer: Self-pay | Admitting: Family Medicine

## 2019-07-26 ENCOUNTER — Encounter: Payer: Self-pay | Admitting: Physician Assistant

## 2019-07-26 MED ORDER — LEVOTHYROXINE SODIUM 88 MCG PO TABS: 88.0000 ug | ORAL_TABLET | Freq: Every day | ORAL | 1 refills | Status: DC

## 2019-08-16 ENCOUNTER — Telehealth: Payer: Self-pay | Admitting: Allergy and Immunology

## 2019-08-16 ENCOUNTER — Other Ambulatory Visit: Payer: Self-pay | Admitting: Allergy and Immunology

## 2019-08-16 NOTE — Telephone Encounter (Signed)
Pt called and is sick and had to change her appointment to 02/23 and needs to have singulair called into her mail order pharmacy. [phone]

## 2019-08-16 NOTE — Telephone Encounter (Signed)
Refill was sent to requested pharmacy.

## 2019-08-17 ENCOUNTER — Ambulatory Visit: Payer: BLUE CROSS/BLUE SHIELD | Admitting: Allergy and Immunology

## 2019-08-17 ENCOUNTER — Ambulatory Visit: Payer: BC Managed Care – PPO | Admitting: Physician Assistant

## 2019-08-23 ENCOUNTER — Ambulatory Visit: Payer: BC Managed Care – PPO | Admitting: Physician Assistant

## 2019-08-28 ENCOUNTER — Other Ambulatory Visit: Payer: Self-pay | Admitting: Physician Assistant

## 2019-09-16 ENCOUNTER — Other Ambulatory Visit: Payer: Self-pay

## 2019-09-17 ENCOUNTER — Ambulatory Visit: Payer: BC Managed Care – PPO | Admitting: Physician Assistant

## 2019-09-17 ENCOUNTER — Encounter: Payer: Self-pay | Admitting: Physician Assistant

## 2019-09-17 VITALS — BP 107/81 | HR 94 | Temp 96.7°F | Ht 62.0 in | Wt 182.0 lb

## 2019-09-17 DIAGNOSIS — E039 Hypothyroidism, unspecified: Secondary | ICD-10-CM

## 2019-09-17 DIAGNOSIS — R635 Abnormal weight gain: Secondary | ICD-10-CM | POA: Diagnosis not present

## 2019-09-17 DIAGNOSIS — R300 Dysuria: Secondary | ICD-10-CM | POA: Diagnosis not present

## 2019-09-17 DIAGNOSIS — K219 Gastro-esophageal reflux disease without esophagitis: Secondary | ICD-10-CM

## 2019-09-17 MED ORDER — LEVOTHYROXINE SODIUM 88 MCG PO TABS: 88.0000 ug | ORAL_TABLET | Freq: Every day | ORAL | 1 refills | Status: DC

## 2019-09-17 MED ORDER — TOPIRAMATE 50 MG PO TABS: 50.0000 mg | ORAL_TABLET | Freq: Two times a day (BID) | ORAL | 5 refills | Status: DC

## 2019-09-17 MED ORDER — SIMVASTATIN 20 MG PO TABS: 20.0000 mg | ORAL_TABLET | Freq: Every day | ORAL | 3 refills | Status: DC

## 2019-09-17 MED ORDER — OMEPRAZOLE 40 MG PO CPDR: 40.0000 mg | DELAYED_RELEASE_CAPSULE | Freq: Every day | ORAL | 3 refills | Status: DC

## 2019-09-21 NOTE — Progress Notes (Signed)
Acute Office Visit  Subjective:    Patient ID: Carol Chambers, female    DOB: 10/01/1960, 59 y.o.   MRN: [id number]  Chief Complaint  Patient presents with  . Medical Management of Chronic Issues  . weight management    Thyroid Problem Presents for follow-up visit. Patient reports no anxiety, cold intolerance, constipation, fatigue, hair loss, heat intolerance, visual change or weight gain. The symptoms have been resolved.  Gastroesophageal Reflux She complains of heartburn. She reports no abdominal pain, no chest pain, no choking, no dysphagia, no early satiety or no stridor. This is a chronic problem. The current episode started more than 1 year ago. The problem occurs occasionally. The symptoms are aggravated by certain foods. Pertinent negatives include no fatigue. She has tried a PPI for the symptoms. The treatment provided significant relief.    Patient is also here for a weight recheck.  She has been taking her medications and doing very well with them.  They have helped to curb her appetite and she is down about 20 pounds over the past year.  She is using Topamax 50 mg only 1 a day we discussed going to twice a day and she would like to try doing that.  The remainder of her medications are good.  She recently had a urinary tract infection which is like to have her urine rechecked.  We will do that today while she is here   Past Medical History:  Diagnosis Date  . Acid reflux   . Anemia   . ASCUS of cervix with negative high risk HPV 10/2018  . Asthma   . History of kidney stones   . Hypercholesteremia   . Hypothyroidism   . PONV (postoperative nausea and vomiting)    with Cholecystectomy  . Seasonal allergies     Past Surgical History:  Procedure Laterality Date  . BRAVO [place] STUDY N/A 08/23/2014   Procedure: BRAVO [place] STUDY;  Surgeon: Inda Castle, MD;  Location: [facility];  Service: Endoscopy;  Laterality: N/A;  48 hour bravo pH study with impedance  plethysmography   . BRAVO [place] STUDY N/A 10/10/2014   Procedure: BRAVO [place] STUDY;  Surgeon: Inda Castle, MD;  Location: [facility];  Service: Endoscopy;  Laterality: N/A;  2nd attempt  . CARPAL TUNNEL RELEASE  2003   bilat  . ESOPHAGOGASTRODUODENOSCOPY (EGD) WITH PROPOFOL N/A 08/23/2014   Procedure: ESOPHAGOGASTRODUODENOSCOPY (EGD) WITH PROPOFOL;  Surgeon: Inda Castle, MD;  Location: [facility];  Service: Endoscopy;  Laterality: N/A;  . ESOPHAGOGASTRODUODENOSCOPY (EGD) WITH PROPOFOL N/A 10/10/2014   Procedure: ESOPHAGOGASTRODUODENOSCOPY (EGD) WITH PROPOFOL;  Surgeon: Inda Castle, MD;  Location: [facility];  Service: Endoscopy;  Laterality: N/A;  . Esophagus stretched     X 2  . FOOT SURGERY     03-24-14 Plantar Fasciatitis  . FOOT SURGERY Left    foot surgery 09-29-14  . groin cyst removed  2000   right  . LAPAROSCOPIC CHOLECYSTECTOMY  2003    Family History  Problem Relation Age of Onset  . Cancer Mother 34       Uterine  . Cancer Father        Lung  . Diabetes Maternal Grandfather   . Heart disease Maternal Grandfather   . Breast cancer Maternal Grandmother        Age 54's  . Colon cancer Neg Hx   . Esophageal cancer Neg Hx   . Rectal cancer Neg Hx   . Stomach cancer  Neg Hx   . Allergic rhinitis Neg Hx   . Asthma Neg Hx   . Eczema Neg Hx   . Urticaria Neg Hx   . Angioedema Neg Hx     Social History   Socioeconomic History  . Marital status: Legally Separated    Spouse name: Not on file  . Number of children: Not on file  . Years of education: Not on file  . Highest education level: Not on file  Occupational History  . Not on file  Tobacco Use  . Smoking status: Never Smoker  . Smokeless tobacco: Never Used  Substance and Sexual Activity  . Alcohol use: Yes    Alcohol/week: 0.0 standard drinks    Comment: Rare  . Drug use: No  . Sexual activity: Yes    Birth control/protection: Post-menopausal    Comment: 1st intercourse 63 yo-5 partners  Other  Topics Concern  . Not on file  Social History Narrative  . Not on file   Social Determinants of Health   Financial Resource Strain:   . Difficulty of Paying Living Expenses: Not on file  Food Insecurity:   . Worried About Charity fundraiser in the Last Year: Not on file  . Ran Out of Food in the Last Year: Not on file  Transportation Needs:   . Lack of Transportation (Medical): Not on file  . Lack of Transportation (Non-Medical): Not on file  Physical Activity:   . Days of Exercise per Week: Not on file  . Minutes of Exercise per Session: Not on file  Stress:   . Feeling of Stress : Not on file  Social Connections:   . Frequency of Communication with Friends and Family: Not on file  . Frequency of Social Gatherings with Friends and Family: Not on file  . Attends Religious Services: Not on file  . Active Member of Clubs or Organizations: Not on file  . Attends Archivist Meetings: Not on file  . Marital Status: Not on file  Intimate Partner Violence:   . Fear of Current or Ex-Partner: Not on file  . Emotionally Abused: Not on file  . Physically Abused: Not on file  . Sexually Abused: Not on file    Outpatient Medications Prior to Visit  Medication Sig Dispense Refill  . albuterol (PROAIR HFA) 108 (90 Base) MCG/ACT inhaler Inhale 2 puffs into the lungs every 4 (four) hours as needed for wheezing or shortness of breath. 1 Inhaler 1  . budesonide-formoterol (SYMBICORT) 160-4.5 MCG/ACT inhaler Inhale 2 puffs into the lungs 2 (two) times daily. 1 Inhaler 5  . Cholecalciferol (VITAMIN D PO) Take by mouth.    . cyclobenzaprine (FLEXERIL) 10 MG tablet TAKE 1 TABLET BY MOUTH THREE TIMES DAILY AS NEEDED FOR MUSCLE SPASMS 60 tablet 2  . cycloSPORINE (RESTASIS) 0.05 % ophthalmic emulsion Restasis 0.05 % eye drops in a dropperette    . Magnesium (GNP MAGNESIUM) 250 MG TABS Take 2 tablets by mouth daily.    . montelukast (SINGULAIR) 10 MG tablet TAKE 1 TABLET BY MOUTH AT BEDTIME  GENERIC EQUIVALENT FOR SINGULAIR 90 tablet 0  . Triamcinolone Acetonide (NASACORT AQ NA) Place 1 spray into the nose 2 (two) times daily.    Marland Kitchen levothyroxine (SYNTHROID) 88 MCG tablet Take 1 tablet (88 mcg total) by mouth daily. 90 tablet 1  . omeprazole (PRILOSEC) 40 MG capsule TAKE ONE CAPSULE BY MOUTH DAILY 90 capsule 3  . simvastatin (ZOCOR) 20 MG tablet TAKE 1  TABLET BY MOUTH AT BEDTIME 90 tablet 0  . topiramate (TOPAMAX) 50 MG tablet Take 1 tablet (50 mg total) by mouth 2 (two) times daily. 60 tablet 2  . buPROPion (WELLBUTRIN XL) 300 MG 24 hr tablet Take 1 tablet (300 mg total) by mouth daily. 90 tablet 3  . FLUoxetine (PROZAC) 10 MG capsule Take 1 capsule (10 mg total) by mouth daily. 90 capsule 4  . FLUoxetine (PROZAC) 20 MG capsule Take 1 capsule (20 mg total) by mouth daily. 90 capsule 4   No facility-administered medications prior to visit.    Allergies  Allergen Reactions  . Sulfa Antibiotics Hives  . Tequin Swelling    Swelling of tongue  . Codeine Itching  . Vicodin [Hydrocodone-Acetaminophen] Other (See Comments)    Hallucinations     Review of Systems  Constitutional: Negative for fatigue and weight gain.  Respiratory: Negative for choking.   Cardiovascular: Negative for chest pain.  Gastrointestinal: Positive for heartburn. Negative for abdominal pain, constipation and dysphagia.  Endocrine: Negative for cold intolerance and heat intolerance.  Psychiatric/Behavioral: The patient is not nervous/anxious.        Objective:    Physical Exam Constitutional:      General: She is not in acute distress.    Appearance: Normal appearance. She is well-developed.  HENT:     Head: Normocephalic and atraumatic.  Cardiovascular:     Rate and Rhythm: Normal rate.  Pulmonary:     Effort: Pulmonary effort is normal.  Skin:    General: Skin is warm and dry.     Findings: No rash.  Neurological:     Mental Status: She is alert and oriented to person, place, and time.      Deep Tendon Reflexes: Reflexes are normal and symmetric.     BP 107/81   Pulse 94   Temp (!) 96.7 F (35.9 C) (Temporal)   Ht 5\' 2"  (1.575 m)   Wt 182 lb (82.6 kg)   LMP 05/26/2014   SpO2 98%   BMI 33.29 kg/m  Wt Readings from Last 3 Encounters:  09/17/19 182 lb (82.6 kg)  06/07/19 182 lb 6.4 oz (82.7 kg)  04/06/19 191 lb 3.2 oz (86.7 kg)    Health Maintenance Due  Topic Date Due  . Hepatitis C Screening  July 08, 1961  . HIV Screening  09/17/1977  . TETANUS/TDAP  09/17/1981    There are no preventive care reminders to display for this patient.   Lab Results  Component Value Date   TSH 0.041 (L) 06/07/2019   Lab Results  Component Value Date   WBC 6.0 04/06/2019   HGB 14.6 04/06/2019   HCT 43.9 04/06/2019   MCV 93 04/06/2019   PLT 257 04/06/2019   Lab Results  Component Value Date   NA 143 04/06/2019   K 4.6 04/06/2019   CO2 24 04/06/2019   GLUCOSE 106 (H) 04/06/2019   BUN 13 04/06/2019   CREATININE 1.00 04/06/2019   BILITOT 0.4 04/06/2019   ALKPHOS 94 04/06/2019   AST 19 04/06/2019   ALT 18 04/06/2019   PROT 6.6 04/06/2019   ALBUMIN 4.4 04/06/2019   CALCIUM 9.5 04/06/2019   Lab Results  Component Value Date   CHOL 182 04/06/2019   Lab Results  Component Value Date   HDL 77 04/06/2019   Lab Results  Component Value Date   LDLCALC 86 04/06/2019   Lab Results  Component Value Date   TRIG 94 04/06/2019   Lab Results  Component Value Date   CHOLHDL 2.4 04/06/2019   Lab Results  Component Value Date   HGBA1C 5.6 04/06/2019       Assessment & Plan:   Problem List Items Addressed This Visit      Digestive   Acid reflux   Relevant Medications   omeprazole (PRILOSEC) 40 MG capsule     Endocrine   Hypothyroid   Relevant Medications   levothyroxine (SYNTHROID) 88 MCG tablet    Other Visit Diagnoses    Dysuria    -  Primary   Relevant Orders   Urinalysis, Complete   Urine Culture   Weight gain       Relevant Medications    topiramate (TOPAMAX) 50 MG tablet       Meds ordered this encounter  Medications  . omeprazole (PRILOSEC) 40 MG capsule    Sig: Take 1 capsule (40 mg total) by mouth daily.    Dispense:  90 capsule    Refill:  3    Order Specific Question:   Supervising Provider    Answer:   Janora Norlander [id number]  . simvastatin (ZOCOR) 20 MG tablet    Sig: Take 1 tablet (20 mg total) by mouth at bedtime.    Dispense:  90 tablet    Refill:  3    Order Specific Question:   Supervising Provider    Answer:   Janora Norlander [id number]  . topiramate (TOPAMAX) 50 MG tablet    Sig: Take 1 tablet (50 mg total) by mouth 2 (two) times daily.    Dispense:  60 tablet    Refill:  5    Order Specific Question:   Supervising Provider    Answer:   Janora Norlander [id number]  . levothyroxine (SYNTHROID) 88 MCG tablet    Sig: Take 1 tablet (88 mcg total) by mouth daily.    Dispense:  90 tablet    Refill:  1    Order Specific Question:   Supervising Provider    Answer:   Janora Norlander [id number]     Terald Sleeper, PA-C   Terald Sleeper PA-C [facility] [address]  [place], [place] [address] [phone]

## 2019-10-05 ENCOUNTER — Other Ambulatory Visit: Payer: Self-pay

## 2019-10-05 ENCOUNTER — Encounter: Payer: Self-pay | Admitting: Allergy and Immunology

## 2019-10-05 ENCOUNTER — Ambulatory Visit: Payer: BC Managed Care – PPO | Admitting: Allergy and Immunology

## 2019-10-05 VITALS — BP 128/86 | HR 84 | Temp 98.1°F | Resp 18 | Ht 62.8 in | Wt 189.0 lb

## 2019-10-05 DIAGNOSIS — K219 Gastro-esophageal reflux disease without esophagitis: Secondary | ICD-10-CM | POA: Diagnosis not present

## 2019-10-05 DIAGNOSIS — J3089 Other allergic rhinitis: Secondary | ICD-10-CM

## 2019-10-05 DIAGNOSIS — J4540 Moderate persistent asthma, uncomplicated: Secondary | ICD-10-CM

## 2019-10-05 NOTE — Patient Instructions (Addendum)
  1.  Continue to perform Allergen avoidance measures - grass / weed  2.  Continue to Treat and prevent inflammation:   A.  OTC Nasocort - 1 spray each nostril 1-7 times a week  B.  Montelukast 10 mg - 1 tablet 1 time per day   3.  Continue to treat reflux:   A.  Consolidate caffeine as much as possible  B.  omeprazole 40 mg tablet 1 time per day  4. If needed:   A.  Nasal saline multiple times a day  B.  OTC cetirizine 10-20 mg 1 time per day  C.  Pro Air HFA or similar 2 inhalations every 4-6 hours  D.  OTC Mucinex DM 2 tablets twice a day  5. Obtain fall flu vaccine (and COVID vaccine)  6.  Can restart Symbicort 160 -2 inhalations 1-2 times per day with spacer depending on asthma disease activity.  7. Return to clinic in 6 months or earlier if problem  8. Obtain Covid vaccine

## 2019-10-05 NOTE — Progress Notes (Signed)
[place] [facility] [place] [facility] [place]   Follow-up Note  Referring Provider: Terald Sleeper, PA-C Primary Provider: Theodoro Clock  Date of Office Visit: 10/05/2019  Subjective:   Carol Chambers (DOB: [birth year]) is a 59 y.o. female who returns to the [facility] on 10/05/2019 in re-evaluation of the following:  HPI: Carol Chambers returns to this clinic in reevaluation of asthma and allergic rhinitis and LPR.  Her last visit to this clinic was 16 March 2019.  She has really done well since her last interval regarding her asthma.  She has not required a systemic steroid to treat an exacerbation and she rarely uses a short acting bronchodilator and she has not had to activate her action plan which includes the use of Symbicort.  Likewise her upper airway has really been doing quite well while using montelukast on a consistent basis.  She uses a nasal steroid just about every other week or so at this point.  Her reflux and LPR is under excellent control while using omeprazole every day.  She drinks minimal amounts of caffeine at this point averaging out to about twice a week.  She did obtain the flu vaccine.  Allergies as of 10/05/2019      Reactions   Sulfa Antibiotics Hives   Tequin Swelling   Swelling of tongue   Codeine Itching   Vicodin [hydrocodone-acetaminophen] Other (See Comments)   Hallucinations      Medication List      albuterol 108 (90 Base) MCG/ACT inhaler Commonly known as: ProAir HFA Inhale 2 puffs into the lungs every 4 (four) hours as needed for wheezing or shortness of breath.   budesonide-formoterol 160-4.5 MCG/ACT inhaler Commonly known as: Symbicort Inhale 2 puffs into the lungs 2 (two) times daily.   cyclobenzaprine 10 MG tablet Commonly known as: FLEXERIL TAKE 1 TABLET BY MOUTH THREE TIMES DAILY AS NEEDED FOR MUSCLE SPASMS   GNP Magnesium 250 MG Tabs Generic drug: Magnesium Take 2 tablets by mouth daily.     levothyroxine 88 MCG tablet Commonly known as: SYNTHROID Take 1 tablet (88 mcg total) by mouth daily.   montelukast 10 MG tablet Commonly known as: SINGULAIR TAKE 1 TABLET BY MOUTH AT BEDTIME GENERIC EQUIVALENT FOR SINGULAIR   NASACORT AQ NA Place 1 spray into the nose 2 (two) times daily. Uses as needed.   omeprazole 40 MG capsule Commonly known as: PRILOSEC Take 1 capsule (40 mg total) by mouth daily.   Restasis 0.05 % ophthalmic emulsion Generic drug: cycloSPORINE Restasis 0.05 % eye drops in a dropperette   simvastatin 20 MG tablet Commonly known as: ZOCOR Take 1 tablet (20 mg total) by mouth at bedtime.   topiramate 50 MG tablet Commonly known as: Topamax Take 1 tablet (50 mg total) by mouth 2 (two) times daily.   VITAMIN D PO Take by mouth.       Past Medical History:  Diagnosis Date  . Acid reflux   . Anemia   . ASCUS of cervix with negative high risk HPV 10/2018  . Asthma   . History of kidney stones   . Hypercholesteremia   . Hypothyroidism   . PONV (postoperative nausea and vomiting)    with Cholecystectomy  . Seasonal allergies     Past Surgical History:  Procedure Laterality Date  . BRAVO [employer] STUDY N/A 08/23/2014   Procedure: BRAVO [place] STUDY;  Surgeon: Inda [facility], MD;  Location: [facility];  Service:  Endoscopy;  Laterality: N/A;  48 hour bravo pH study with impedance plethysmography   . BRAVO [place] STUDY N/A 10/10/2014   Procedure: BRAVO [place] STUDY;  Surgeon: Inda [facility], MD;  Location: [facility];  Service: Endoscopy;  Laterality: N/A;  2nd attempt  . CARPAL TUNNEL RELEASE  2003   bilat  . ESOPHAGOGASTRODUODENOSCOPY (EGD) WITH PROPOFOL N/A 08/23/2014   Procedure: ESOPHAGOGASTRODUODENOSCOPY (EGD) WITH PROPOFOL;  Surgeon: Inda Castle, MD;  Location: [facility];  Service: Endoscopy;  Laterality: N/A;  . ESOPHAGOGASTRODUODENOSCOPY (EGD) WITH PROPOFOL N/A 10/10/2014   Procedure: ESOPHAGOGASTRODUODENOSCOPY (EGD) WITH PROPOFOL;  Surgeon:  Inda Castle, MD;  Location: [facility];  Service: Endoscopy;  Laterality: N/A;  . Esophagus stretched     X 2  . FOOT SURGERY     03-24-14 Plantar Fasciatitis  . FOOT SURGERY Left    foot surgery 09-29-14  . groin cyst removed  2000   right  . LAPAROSCOPIC CHOLECYSTECTOMY  2003    Review of systems negative except as noted in HPI / PMHx or noted below:  Review of Systems  Constitutional: Negative.   HENT: Negative.   Eyes: Negative.   Respiratory: Negative.   Cardiovascular: Negative.   Gastrointestinal: Negative.   Genitourinary: Negative.   Musculoskeletal: Negative.   Skin: Negative.   Neurological: Negative.   Endo/Heme/Allergies: Negative.   Psychiatric/Behavioral: Negative.      Objective:   Vitals:   10/05/19 1105  BP: 128/86  Pulse: 84  Resp: 18  Temp: 98.1 F (36.7 C)  SpO2: 98%   Height: 5' 2.8" (159.5 cm)  Weight: 189 lb (85.7 kg)   Physical Exam Constitutional:      Appearance: She is not diaphoretic.  HENT:     Head: Normocephalic.     Right Ear: Tympanic membrane, ear canal and external ear normal.     Left Ear: Tympanic membrane, ear canal and external ear normal.     Nose: Nose normal. No mucosal edema or rhinorrhea.     Mouth/Throat:     Pharynx: Uvula midline. No oropharyngeal exudate.  Eyes:     Conjunctiva/sclera: Conjunctivae normal.  Neck:     Thyroid: No thyromegaly.     Trachea: Trachea normal. No tracheal tenderness or tracheal deviation.  Cardiovascular:     Rate and Rhythm: Normal rate and regular rhythm.     Heart sounds: Normal heart sounds, S1 normal and S2 normal. No murmur.  Pulmonary:     Effort: No respiratory distress.     Breath sounds: Normal breath sounds. No stridor. No wheezing or rales.  Lymphadenopathy:     Head:     Right side of head: No tonsillar adenopathy.     Left side of head: No tonsillar adenopathy.     Cervical: No cervical adenopathy.  Skin:    Findings: No erythema or rash.     Nails:  There is no clubbing.  Neurological:     Mental Status: She is alert.     Diagnostics:    Spirometry was performed and demonstrated an FEV1 of 2.28 at 90 % of predicted.  The patient had an Asthma Control Test with the following results: ACT Total Score: 24.    Assessment and Plan:   1. Asthma, moderate persistent, well-controlled   2. Other allergic rhinitis   3. LPRD (laryngopharyngeal reflux disease)     1.  Continue to perform Allergen avoidance measures - grass / weed  2.  Continue to Treat and prevent inflammation:  A.  OTC Nasocort - 1 spray each nostril 1-7 times a week  B.  Montelukast 10 mg - 1 tablet 1 time per day   3.  Continue to treat reflux:   A.  Consolidate caffeine as much as possible  B.  omeprazole 40 mg tablet 1 time per day  4. If needed:   A.  Nasal saline multiple times a day  B.  OTC cetirizine 10-20 mg 1 time per day  C.  Pro Air HFA or similar 2 inhalations every 4-6 hours  D.  OTC Mucinex DM 2 tablets twice a day  5. Obtain fall flu vaccine (and COVID vaccine)  6.  Can restart Symbicort 160 -2 inhalations 1-2 times per day with spacer depending on asthma disease activity.  7. Return to clinic in 6 months or earlier if problem  8. Obtain Covid vaccine  Cabrielle is really doing very well on her current therapy and I would like for her to continue to utilize some anti-inflammatory agents for airway and at this point we will have her use montelukast consistently.  She has the option of restarting her Symbicort at 1 or 2 times per day depending on disease activity if she develops problems as she goes through this upcoming spring and she can always restart consistent use of a nasal steroid if required.  She will maintain therapy for her reflux with omeprazole.  I will see her back in this clinic in 6 months or earlier if there is a problem.  Allena Katz, MD Allergy / Immunology [facility]

## 2019-10-06 ENCOUNTER — Encounter: Payer: Self-pay | Admitting: Allergy and Immunology

## 2019-10-06 MED ORDER — BUDESONIDE-FORMOTEROL FUMARATE 160-4.5 MCG/ACT IN AERO: INHALATION_SPRAY | RESPIRATORY_TRACT | 5 refills | Status: DC

## 2019-10-06 MED ORDER — MONTELUKAST SODIUM 10 MG PO TABS: 10.0000 mg | ORAL_TABLET | Freq: Every day | ORAL | 1 refills | Status: DC

## 2019-10-18 ENCOUNTER — Other Ambulatory Visit: Payer: Self-pay

## 2019-10-18 DIAGNOSIS — Z23 Encounter for immunization: Secondary | ICD-10-CM | POA: Diagnosis not present

## 2019-10-19 ENCOUNTER — Encounter: Payer: Self-pay | Admitting: Obstetrics and Gynecology

## 2019-10-19 ENCOUNTER — Ambulatory Visit (INDEPENDENT_AMBULATORY_CARE_PROVIDER_SITE_OTHER): Payer: BC Managed Care – PPO | Admitting: Obstetrics and Gynecology

## 2019-10-19 VITALS — BP 118/76 | Ht 62.5 in | Wt 186.0 lb

## 2019-10-19 DIAGNOSIS — Z124 Encounter for screening for malignant neoplasm of cervix: Secondary | ICD-10-CM

## 2019-10-19 DIAGNOSIS — Z01419 Encounter for gynecological examination (general) (routine) without abnormal findings: Secondary | ICD-10-CM | POA: Diagnosis not present

## 2019-10-19 DIAGNOSIS — Z1231 Encounter for screening mammogram for malignant neoplasm of breast: Secondary | ICD-10-CM | POA: Diagnosis not present

## 2019-10-19 DIAGNOSIS — Z8742 Personal history of other diseases of the female genital tract: Secondary | ICD-10-CM

## 2019-10-19 DIAGNOSIS — Z1151 Encounter for screening for human papillomavirus (HPV): Secondary | ICD-10-CM

## 2019-10-19 NOTE — Progress Notes (Signed)
NECOLE BUEN 1960-11-13 [id number]  SUBJECTIVE:  59 y.o. G1P0010 female for annual routine gynecologic exam and Pap smear. She has no gynecologic concerns.  Current Outpatient Medications  Medication Sig Dispense Refill  . albuterol (PROAIR HFA) 108 (90 Base) MCG/ACT inhaler Inhale 2 puffs into the lungs every 4 (four) hours as needed for wheezing or shortness of breath. 1 Inhaler 1  . budesonide-formoterol (SYMBICORT) 160-4.5 MCG/ACT inhaler Inhale 2 puffs into the lungs 2 (two) times daily. (Patient taking differently: Inhale 2 puffs into the lungs 2 (two) times daily. Uses as needed.) 1 Inhaler 5  . budesonide-formoterol (SYMBICORT) 160-4.5 MCG/ACT inhaler Use 2 puffs 1-2 times daily as directed. 1 Inhaler 5  . Cholecalciferol (VITAMIN D PO) Take by mouth.    . cyclobenzaprine (FLEXERIL) 10 MG tablet TAKE 1 TABLET BY MOUTH THREE TIMES DAILY AS NEEDED FOR MUSCLE SPASMS 60 tablet 2  . cycloSPORINE (RESTASIS) 0.05 % ophthalmic emulsion Restasis 0.05 % eye drops in a dropperette    . levothyroxine (SYNTHROID) 88 MCG tablet Take 1 tablet (88 mcg total) by mouth daily. 90 tablet 1  . Magnesium (GNP MAGNESIUM) 250 MG TABS Take 2 tablets by mouth daily.    . montelukast (SINGULAIR) 10 MG tablet TAKE 1 TABLET BY MOUTH AT BEDTIME GENERIC EQUIVALENT FOR SINGULAIR 90 tablet 0  . montelukast (SINGULAIR) 10 MG tablet Take 1 tablet (10 mg total) by mouth at bedtime. 90 tablet 1  . omeprazole (PRILOSEC) 40 MG capsule Take 1 capsule (40 mg total) by mouth daily. 90 capsule 3  . simvastatin (ZOCOR) 20 MG tablet Take 1 tablet (20 mg total) by mouth at bedtime. 90 tablet 3  . topiramate (TOPAMAX) 50 MG tablet Take 1 tablet (50 mg total) by mouth 2 (two) times daily. 60 tablet 5  . Triamcinolone Acetonide (NASACORT AQ NA) Place 1 spray into the nose 2 (two) times daily. Uses as needed.     No current facility-administered medications for this visit.   Allergies: Sulfa antibiotics, Tequin, Codeine, and  Vicodin [hydrocodone-acetaminophen]  Patient's last menstrual period was 05/26/2014.  Past medical history,surgical history, problem list, medications, allergies, family history and social history were all reviewed and documented as reviewed in the EPIC chart.  ROS:  Feeling well. No dyspnea or chest pain on exertion.  No abdominal pain, change in bowel habits, black or bloody stools.  No urinary tract symptoms. GYN ROS: no abnormal bleeding, pelvic pain or discharge, no breast pain or new or enlarging lumps on self exam. No neurological complaints.   OBJECTIVE:  BP 118/76   Ht 5' 2.5" (1.588 m)   Wt 186 lb (84.4 kg)   LMP 05/26/2014   BMI 33.48 kg/m  The patient appears well, alert, oriented x 3, in no distress. ENT normal.  Neck supple. No cervical or supraclavicular adenopathy or thyromegaly.  Lungs are clear, good air entry, no wheezes, rhonchi or rales. S1 and S2 normal, no murmurs, regular rate and rhythm.  Abdomen soft without tenderness, guarding, mass or organomegaly.  Neurological is normal, no focal findings.  BREAST EXAM: breasts appear normal, no suspicious masses, no skin or nipple changes or axillary nodes  PELVIC EXAM: VULVA: normal appearing vulva with no masses, tenderness or lesions, atrophic changes noted, VAGINA: normal appearing vagina with normal color and discharge, no lesions, CERVIX: normal appearing cervix without discharge or lesions, UTERUS: uterus is normal size, shape, consistency and nontender, ADNEXA: normal adnexa in size, nontender and no masses PAP: Pap smear done  today, thin-prep method, HPV test  Chaperone: [name] present during the examination  ASSESSMENT:  59 y.o. G1P0010 here for annual gynecologic exam  PLAN:   1. Postmenopausal.  No significant symptoms, doing well at this time.  She did wean off of the fluoxetine and has been doing quite well and has no concerns. 2. Pap smear 10/2018 was ASCUS negative HPV.  Pap smear with Co-test  repeated today as the sample last year returned with ASCUS result and had absence of transformation zone components.   3. Mammogram 10/2019.  Normal breast exam today.  Continue annual mammogram this year when due. 4. Colonoscopy 2014.  Recommended that she follow up at the recommended interval.   5. DEXA never.  Will plan by age 89. 77. Health maintenance.  No labs today as she normally has these completed with her primary care provider.  Return annually or sooner, prn.  Joseph Pierini MD  10/19/19

## 2019-10-19 NOTE — Addendum Note (Signed)
Addended by: Nelva Nay on: 10/19/2019 09:19 AM   Modules accepted: Orders

## 2019-10-21 LAB — PAP IG AND HPV HIGH-RISK: HPV DNA High Risk: NOT DETECTED

## 2019-11-03 ENCOUNTER — Encounter: Payer: Self-pay | Admitting: Physician Assistant

## 2019-11-13 ENCOUNTER — Other Ambulatory Visit: Payer: Self-pay | Admitting: Allergy and Immunology

## 2019-11-16 DIAGNOSIS — Z23 Encounter for immunization: Secondary | ICD-10-CM | POA: Diagnosis not present

## 2019-11-19 ENCOUNTER — Other Ambulatory Visit: Payer: Self-pay | Admitting: *Deleted

## 2019-11-19 MED ORDER — SIMVASTATIN 20 MG PO TABS: 20.0000 mg | ORAL_TABLET | Freq: Every day | ORAL | 1 refills | Status: DC

## 2019-12-07 DIAGNOSIS — H524 Presbyopia: Secondary | ICD-10-CM | POA: Diagnosis not present

## 2019-12-07 DIAGNOSIS — H5213 Myopia, bilateral: Secondary | ICD-10-CM | POA: Diagnosis not present

## 2020-02-25 ENCOUNTER — Telehealth: Payer: Self-pay | Admitting: Allergy and Immunology

## 2020-02-25 MED ORDER — MONTELUKAST SODIUM 10 MG PO TABS: 10.0000 mg | ORAL_TABLET | Freq: Every day | ORAL | 0 refills | Status: DC

## 2020-02-25 NOTE — Telephone Encounter (Signed)
Refill has been sent. Patient informed.

## 2020-02-25 NOTE — Telephone Encounter (Signed)
Patient has a change of pharmacy, to [facility]. [phone]. [facility] said [facility] sent a [facility] to [place] on Monday asking for a refill for her Singulair. She had one  refill left with her old pharmacy, but has now switched. She is going out of town on July 24th and needs this filled before then, so it will be mailed to her in time.

## 2020-03-06 ENCOUNTER — Other Ambulatory Visit: Payer: Self-pay | Admitting: *Deleted

## 2020-03-06 MED ORDER — LEVOTHYROXINE SODIUM 88 MCG PO TABS: 88.0000 ug | ORAL_TABLET | Freq: Every day | ORAL | 0 refills | Status: DC

## 2020-03-17 ENCOUNTER — Ambulatory Visit: Payer: BC Managed Care – PPO | Admitting: Family Medicine

## 2020-03-17 ENCOUNTER — Ambulatory Visit: Payer: BC Managed Care – PPO | Admitting: Physician Assistant

## 2020-03-17 ENCOUNTER — Other Ambulatory Visit: Payer: Self-pay

## 2020-03-17 ENCOUNTER — Encounter: Payer: Self-pay | Admitting: Family Medicine

## 2020-03-17 VITALS — BP 100/62 | HR 80 | Temp 98.0°F | Ht 62.5 in | Wt 176.8 lb

## 2020-03-17 DIAGNOSIS — E034 Atrophy of thyroid (acquired): Secondary | ICD-10-CM | POA: Diagnosis not present

## 2020-03-17 DIAGNOSIS — G43009 Migraine without aura, not intractable, without status migrainosus: Secondary | ICD-10-CM

## 2020-03-17 DIAGNOSIS — K219 Gastro-esophageal reflux disease without esophagitis: Secondary | ICD-10-CM

## 2020-03-17 DIAGNOSIS — E782 Mixed hyperlipidemia: Secondary | ICD-10-CM

## 2020-03-17 DIAGNOSIS — M545 Low back pain: Secondary | ICD-10-CM

## 2020-03-17 DIAGNOSIS — G8929 Other chronic pain: Secondary | ICD-10-CM

## 2020-03-17 DIAGNOSIS — Z7689 Persons encountering health services in other specified circumstances: Secondary | ICD-10-CM | POA: Diagnosis not present

## 2020-03-17 LAB — CBC WITH DIFFERENTIAL/PLATELET
Basophils Absolute: 0.1 10*3/uL (ref 0.0–0.2)
Basos: 1 %
EOS (ABSOLUTE): 0.1 10*3/uL (ref 0.0–0.4)
Eos: 2 %
Hematocrit: 43.1 % (ref 34.0–46.6)
Hemoglobin: 15.2 g/dL (ref 11.1–15.9)
Immature Grans (Abs): 0 10*3/uL (ref 0.0–0.1)
Immature Granulocytes: 0 %
Lymphocytes Absolute: 1.7 10*3/uL (ref 0.7–3.1)
Lymphs: 29 %
MCH: 32.1 pg (ref 26.6–33.0)
MCHC: 35.3 g/dL (ref 31.5–35.7)
MCV: 91 fL (ref 79–97)
Monocytes Absolute: 0.6 10*3/uL (ref 0.1–0.9)
Monocytes: 11 %
Neutrophils Absolute: 3.4 10*3/uL (ref 1.4–7.0)
Neutrophils: 57 %
Platelets: 238 10*3/uL (ref 150–450)
RBC: 4.74 x10E6/uL (ref 3.77–5.28)
RDW: 12.7 % (ref 11.7–15.4)
WBC: 5.9 10*3/uL (ref 3.4–10.8)

## 2020-03-17 MED ORDER — OMEPRAZOLE 40 MG PO CPDR: 40.0000 mg | DELAYED_RELEASE_CAPSULE | Freq: Every day | ORAL | 3 refills | Status: DC

## 2020-03-17 MED ORDER — TOPIRAMATE 50 MG PO TABS: 50.0000 mg | ORAL_TABLET | Freq: Two times a day (BID) | ORAL | 3 refills | Status: DC

## 2020-03-17 MED ORDER — CYCLOBENZAPRINE HCL 10 MG PO TABS: 10.0000 mg | ORAL_TABLET | Freq: Three times a day (TID) | ORAL | 0 refills | Status: DC | PRN

## 2020-03-17 MED ORDER — SIMVASTATIN 20 MG PO TABS: 20.0000 mg | ORAL_TABLET | Freq: Every day | ORAL | 3 refills | Status: DC

## 2020-03-17 NOTE — Patient Instructions (Signed)

## 2020-03-17 NOTE — Progress Notes (Signed)
Subjective: CC: Establish care, hypothyroid PCP: Janora Norlander, DO FVC:[facility] W Brayton El is a 59 y.o. female presenting to clinic today for:  1.  Acquired hypothyroidism Patient reports onset at age 32.  No history of radiation or surgery to the neck.  There is a family history of her mother of thyroid disorder.  She is compliant with Synthroid 88 mcg daily.  Denies any change in voice, difficulty swallowing, tremor, heart palpitations, change in bowel habit or weight  2.  Migraine headaches Patient reports intermittent migraine headaches.  These occur less than 1 time per 3 months.  She has been stable on Topamax 50 mg twice daily.  Denies any brain fogginess, falls or excessive daytime sedation  3.  Chronic low back pain Patient reports intermittent back spasms in the low back that she describes as "sciatica" but denies any radiation down either lower extremity.  She uses the Flexeril at bedtime typically with the spasms.  She occasionally has spasms in the neck.  She works at [employer]  4.  GERD Patient reports good control of acid reflux with GERD.  Denies any history of hematochezia, melena or ulcers but had some "lesions" in her stomach before.   ROS: Per HPI  Allergies  Allergen Reactions  . Sulfa Antibiotics Hives  . Tequin Swelling    Swelling of tongue  . Codeine Itching  . Vicodin [Hydrocodone-Acetaminophen] Other (See Comments)    Hallucinations    Past Medical History:  Diagnosis Date  . Acid reflux   . Anemia   . ASCUS of cervix with negative high risk HPV 10/2018  . Asthma   . History of kidney stones   . Hypercholesteremia   . Hypothyroidism   . PONV (postoperative nausea and vomiting)    with Cholecystectomy  . Seasonal allergies     Current Outpatient Medications:  .  albuterol (PROAIR HFA) 108 (90 Base) MCG/ACT inhaler, Inhale 2 puffs into the lungs every 4 (four) hours as needed for wheezing or shortness of breath., Disp: 1 Inhaler, Rfl:  1 .  budesonide-formoterol (SYMBICORT) 160-4.5 MCG/ACT inhaler, Use 2 puffs 1-2 times daily as directed., Disp: 1 Inhaler, Rfl: 5 .  Cholecalciferol (VITAMIN D PO), Take by mouth., Disp: , Rfl:  .  cyclobenzaprine (FLEXERIL) 10 MG tablet, TAKE 1 TABLET BY MOUTH THREE TIMES DAILY AS NEEDED FOR MUSCLE SPASMS, Disp: 60 tablet, Rfl: 2 .  cycloSPORINE (RESTASIS) 0.05 % ophthalmic emulsion, Restasis 0.05 % eye drops in a dropperette, Disp: , Rfl:  .  levothyroxine (SYNTHROID) 88 MCG tablet, Take 1 tablet (88 mcg total) by mouth daily., Disp: 90 tablet, Rfl: 0 .  Magnesium (GNP MAGNESIUM) 250 MG TABS, Take 2 tablets by mouth daily., Disp: , Rfl:  .  montelukast (SINGULAIR) 10 MG tablet, Take 1 tablet (10 mg total) by mouth at bedtime., Disp: 90 tablet, Rfl: 0 .  omeprazole (PRILOSEC) 40 MG capsule, Take 1 capsule (40 mg total) by mouth daily., Disp: 90 capsule, Rfl: 3 .  simvastatin (ZOCOR) 20 MG tablet, Take 1 tablet (20 mg total) by mouth at bedtime., Disp: 90 tablet, Rfl: 1 .  topiramate (TOPAMAX) 50 MG tablet, Take 1 tablet (50 mg total) by mouth 2 (two) times daily., Disp: 60 tablet, Rfl: 5 .  Triamcinolone Acetonide (NASACORT AQ NA), Place 1 spray into the nose 2 (two) times daily. Uses as needed., Disp: , Rfl:  .  budesonide-formoterol (SYMBICORT) 160-4.5 MCG/ACT inhaler, Inhale 2 puffs into the lungs 2 (two) times  daily. (Patient taking differently: Inhale 2 puffs into the lungs 2 (two) times daily. Uses as needed.), Disp: 1 Inhaler, Rfl: 5 Social History   Socioeconomic History  . Marital status: Legally Separated    Spouse name: Not on file  . Number of children: Not on file  . Years of education: Not on file  . Highest education level: Not on file  Occupational History  . Not on file  Tobacco Use  . Smoking status: Never Smoker  . Smokeless tobacco: Never Used  Vaping Use  . Vaping Use: Never used  Substance and Sexual Activity  . Alcohol use: Yes    Alcohol/week: 0.0 standard  drinks    Comment: Rare  . Drug use: No  . Sexual activity: Yes    Birth control/protection: Post-menopausal    Comment: 1st intercourse 16 yo-5 partners  Other Topics Concern  . Not on file  Social History Narrative  . Not on file   Social Determinants of Health   Financial Resource Strain:   . Difficulty of Paying Living Expenses:   Food Insecurity:   . Worried About Charity fundraiser in the Last Year:   . Arboriculturist in the Last Year:   Transportation Needs:   . Film/video editor (Medical):   Marland Kitchen Lack of Transportation (Non-Medical):   Physical Activity:   . Days of Exercise per Week:   . Minutes of Exercise per Session:   Stress:   . Feeling of Stress :   Social Connections:   . Frequency of Communication with Friends and Family:   . Frequency of Social Gatherings with Friends and Family:   . Attends Religious Services:   . Active Member of Clubs or Organizations:   . Attends Archivist Meetings:   Marland Kitchen Marital Status:   Intimate Partner Violence:   . Fear of Current or Ex-Partner:   . Emotionally Abused:   Marland Kitchen Physically Abused:   . Sexually Abused:    Family History  Problem Relation Age of Onset  . Cancer Mother 73       Uterine  . Cancer Father        Lung  . Diabetes Maternal Grandfather   . Heart disease Maternal Grandfather   . Breast cancer Maternal Grandmother        Age 55's  . Colon cancer Neg Hx   . Esophageal cancer Neg Hx   . Rectal cancer Neg Hx   . Stomach cancer Neg Hx   . Allergic rhinitis Neg Hx   . Asthma Neg Hx   . Eczema Neg Hx   . Urticaria Neg Hx   . Angioedema Neg Hx   . Atopy Neg Hx   . Immunodeficiency Neg Hx     Objective: Office vital signs reviewed. BP 100/62   Pulse 80   Temp 98 F (36.7 C)   Ht 5' 2.5" (1.588 m)   Wt 176 lb 12.8 oz (80.2 kg)   LMP 05/26/2014   SpO2 96%   BMI 31.82 kg/m   Physical Examination:  General: Awake, alert, well nourished, No acute distress HEENT: Normal; Day.  Full  feeling thyroid but no discrete masses or goiter.  No exophthalmos Cardio: regular rate and rhythm, S1S2 heard, no murmurs appreciated Pulm: clear to auscultation bilaterally, no wheezes, rhonchi or rales; normal work of breathing on room air Extremities: warm, well perfused, No edema, cyanosis or clubbing; +2 pulses bilaterally MSK: normal gait and station Skin: dry;  intact; no rashes or lesions; normal temperature Neuro: No tremor Psych mood stable, speech normal, affect appropriate:  Depression screen [facility] 2/9 03/17/2020 09/17/2019 06/07/2019  Decreased Interest 0 0 0  Down, Depressed, Hopeless 0 0 0  PHQ - 2 Score 0 0 0  Altered sleeping - 0 -  Tired, decreased energy - 0 -  Change in appetite - 0 -  Feeling bad or failure about yourself  - 1 -  Trouble concentrating - 0 -  Moving slowly or fidgety/restless - 0 -  Suicidal thoughts - 0 -  PHQ-9 Score - 1 -  Difficult doing work/chores - Not difficult at all -    Assessment/ Plan: 59 y.o. female   1. Hypothyroidism due to acquired atrophy of thyroid Asymptomatic - Thyroid Panel With TSH  2. Establishing care with new doctor, encounter for  3. Gastroesophageal reflux disease without esophagitis Stable.  No history of GI bleed - omeprazole (PRILOSEC) 40 MG capsule; Take 1 capsule (40 mg total) by mouth daily.  Dispense: 90 capsule; Refill: 3  4. Mixed hyperlipidemia Not due for fasting lipid - simvastatin (ZOCOR) 20 MG tablet; Take 1 tablet (20 mg total) by mouth at bedtime.  Dispense: 90 tablet; Refill: 3  5. Migraine without aura and without status migrainosus, not intractable Stable - topiramate (TOPAMAX) 50 MG tablet; Take 1 tablet (50 mg total) by mouth 2 (two) times daily.  Dispense: 180 tablet; Refill: 3 - CBC with Differential  6. Chronic bilateral low back pain without sciatica Stable with as needed use of Flexeril - cyclobenzaprine (FLEXERIL) 10 MG tablet; Take 1 tablet (10 mg total) by mouth 3 (three) times  daily as needed. for muscle spams  Dispense: 180 tablet; Refill: 0   No orders of the defined types were placed in this encounter.  No orders of the defined types were placed in this encounter.    Janora Norlander, DO [place] [phone]

## 2020-03-20 ENCOUNTER — Other Ambulatory Visit: Payer: Self-pay | Admitting: *Deleted

## 2020-03-20 ENCOUNTER — Telehealth: Payer: Self-pay | Admitting: Family Medicine

## 2020-03-20 ENCOUNTER — Other Ambulatory Visit: Payer: Self-pay | Admitting: Family Medicine

## 2020-03-20 DIAGNOSIS — E034 Atrophy of thyroid (acquired): Secondary | ICD-10-CM

## 2020-03-20 DIAGNOSIS — G8929 Other chronic pain: Secondary | ICD-10-CM

## 2020-03-20 LAB — THYROID PANEL WITH TSH

## 2020-03-20 MED ORDER — CYCLOBENZAPRINE HCL 10 MG PO TABS: 10.0000 mg | ORAL_TABLET | Freq: Three times a day (TID) | ORAL | 0 refills | Status: DC | PRN

## 2020-03-20 NOTE — Telephone Encounter (Signed)
Pt aware of provider feedback and voiced understanding. 

## 2020-03-20 NOTE — Telephone Encounter (Signed)
Please inform patient that insurance will not approve longer than 21 day rx without imaging to definitively define etiology of chronic low back pain.  She is using prn so likely not a problem for her but FYI, only 21 day supply sent

## 2020-03-20 NOTE — Telephone Encounter (Signed)
Pharmacy needs ICD-10 for the Cyclobenzaprine since Rx is greater than 21 days. The chronic low back pain is not acceptable. Please advise

## 2020-03-21 ENCOUNTER — Other Ambulatory Visit: Payer: Self-pay | Admitting: Family Medicine

## 2020-03-21 ENCOUNTER — Telehealth: Payer: Self-pay

## 2020-03-21 LAB — THYROID PANEL WITH TSH
Free Thyroxine Index: 1.8 (ref 1.2–4.9)
T3 Uptake Ratio: 26 % (ref 24–39)
T4, Total: 6.9 ug/dL (ref 4.5–12.0)
TSH: 0.903 u[IU]/mL (ref 0.450–4.500)

## 2020-03-21 MED ORDER — LEVOTHYROXINE SODIUM 88 MCG PO TABS: 88.0000 ug | ORAL_TABLET | Freq: Every day | ORAL | 3 refills | Status: DC

## 2020-03-21 NOTE — Telephone Encounter (Signed)
[facility] called today to confirm amount prescribed of cyclobenzaprine and diagnosis.   Pt takes medication TID prn for muscle spasms in lower back and for sciatica. She will be followed by Dr. Lajuana Ripple 68m to yearly.

## 2020-04-11 ENCOUNTER — Ambulatory Visit: Payer: BC Managed Care – PPO | Admitting: Allergy and Immunology

## 2020-04-11 ENCOUNTER — Encounter: Payer: Self-pay | Admitting: Allergy and Immunology

## 2020-04-11 ENCOUNTER — Other Ambulatory Visit: Payer: Self-pay

## 2020-04-11 VITALS — BP 110/70 | HR 59 | Temp 98.7°F | Resp 18

## 2020-04-11 DIAGNOSIS — J3089 Other allergic rhinitis: Secondary | ICD-10-CM

## 2020-04-11 DIAGNOSIS — K219 Gastro-esophageal reflux disease without esophagitis: Secondary | ICD-10-CM

## 2020-04-11 DIAGNOSIS — J4540 Moderate persistent asthma, uncomplicated: Secondary | ICD-10-CM

## 2020-04-11 MED ORDER — BUDESONIDE-FORMOTEROL FUMARATE 160-4.5 MCG/ACT IN AERO: INHALATION_SPRAY | RESPIRATORY_TRACT | 5 refills | Status: DC

## 2020-04-11 MED ORDER — MONTELUKAST SODIUM 10 MG PO TABS: 10.0000 mg | ORAL_TABLET | Freq: Every day | ORAL | 1 refills | Status: DC

## 2020-04-11 NOTE — Patient Instructions (Addendum)
  1.  Continue to perform Allergen avoidance measures - grass / weed  2.  Continue to Treat and prevent inflammation:   A.  OTC Nasocort - 1 spray each nostril 1-7 times a week  B.  Montelukast 10 mg - 1 tablet 1 time per day   3.  Continue to treat reflux:   A.  omeprazole 40 mg tablet 1 time per day  4. If needed:   A.  Nasal saline multiple times a day  B.  OTC cetirizine 10-20 mg 1 time per day  C.  Pro Air HFA or similar 2 inhalations every 4-6 hours  D.  OTC Mucinex DM 2 tablets twice a day  5. Obtain fall flu vaccine and COVID booster vaccine  6.  Can restart Symbicort 160 -2 inhalations 1-2 times per day with spacer depending on asthma disease activity.  7. Return to clinic in 12 months or earlier if problem

## 2020-04-11 NOTE — Progress Notes (Signed)
[place] [facility] [place] [facility] [place]   Follow-up Note  Referring Provider: Janora Norlander, DO Primary Provider: Janora Norlander, DO Date of Office Visit: 04/11/2020  Subjective:   Carol Chambers (DOB: Oct 27, 1960) is a 59 y.o. female who returns to the [facility] on 04/11/2020 in re-evaluation of the following:  HPI: Carol Chambers returns to this clinic in reevaluation of asthma and allergic rhinitis and LPR.  Her last visit to this clinic was 05 October 2019.   Her asthma has been under excellent control and rarely throughout the spring has she had to use any Symbicort and she has had 0 requirement for albuterol.  She has not required a systemic steroid to treat an exacerbation.  She can exercise without any difficulty.  Likewise her nose has really been doing quite well even throughout the spring season.  She continues on montelukast on a regular basis and occasionally uses a nasal steroid.  Her reflux has been under excellent control ever since she modified her caffeine significantly and continues to use omeprazole.  She has obtained 2 Moderna Covid vaccines.  Allergies as of 04/11/2020      Reactions   Sulfa Antibiotics Hives   Tequin Swelling   Swelling of tongue   Codeine Itching   Vicodin [hydrocodone-acetaminophen] Other (See Comments)   Hallucinations      Medication List      albuterol 108 (90 Base) MCG/ACT inhaler Commonly known as: ProAir HFA Inhale 2 puffs into the lungs every 4 (four) hours as needed for wheezing or shortness of breath.   budesonide-formoterol 160-4.5 MCG/ACT inhaler Commonly known as: Symbicort Use 2 puffs 1-2 times daily as directed.   cyclobenzaprine 10 MG tablet Commonly known as: FLEXERIL Take 1 tablet (10 mg total) by mouth 3 (three) times daily as needed. for muscle spams   GNP Magnesium 250 MG Tabs Generic drug: Magnesium Take 2 tablets by mouth daily.   levothyroxine 88 MCG  tablet Commonly known as: SYNTHROID Take 1 tablet (88 mcg total) by mouth daily.   montelukast 10 MG tablet Commonly known as: SINGULAIR Take 1 tablet (10 mg total) by mouth at bedtime.   NASACORT AQ NA Place 1 spray into the nose 2 (two) times daily. Uses as needed.   omeprazole 40 MG capsule Commonly known as: PRILOSEC Take 1 capsule (40 mg total) by mouth daily.   Restasis 0.05 % ophthalmic emulsion Generic drug: cycloSPORINE Restasis 0.05 % eye drops in a dropperette   simvastatin 20 MG tablet Commonly known as: ZOCOR Take 1 tablet (20 mg total) by mouth at bedtime.   topiramate 50 MG tablet Commonly known as: Topamax Take 1 tablet (50 mg total) by mouth 2 (two) times daily.   VITAMIN D PO Take by mouth.       Past Medical History:  Diagnosis Date  . Acid reflux   . Anemia   . ASCUS of cervix with negative high risk HPV 10/2018  . Asthma   . History of kidney stones   . Hypercholesteremia   . Hypothyroidism   . PONV (postoperative nausea and vomiting)    with Cholecystectomy  . Seasonal allergies     Past Surgical History:  Procedure Laterality Date  . BRAVO [place] STUDY N/A 08/23/2014   Procedure: BRAVO [place] STUDY;  Surgeon: Inda Castle, MD;  Location: [facility];  Service: Endoscopy;  Laterality: N/A;  48 hour bravo pH study with impedance plethysmography   .  BRAVO [place] STUDY N/A 10/10/2014   Procedure: BRAVO [place] STUDY;  Surgeon: Inda Castle, MD;  Location: [facility];  Service: Endoscopy;  Laterality: N/A;  2nd attempt  . CARPAL TUNNEL RELEASE  2003   bilat  . ESOPHAGOGASTRODUODENOSCOPY (EGD) WITH PROPOFOL N/A 08/23/2014   Procedure: ESOPHAGOGASTRODUODENOSCOPY (EGD) WITH PROPOFOL;  Surgeon: Inda Castle, MD;  Location: [facility];  Service: Endoscopy;  Laterality: N/A;  . ESOPHAGOGASTRODUODENOSCOPY (EGD) WITH PROPOFOL N/A 10/10/2014   Procedure: ESOPHAGOGASTRODUODENOSCOPY (EGD) WITH PROPOFOL;  Surgeon: Inda Castle, MD;  Location: [facility];   Service: Endoscopy;  Laterality: N/A;  . Esophagus stretched     X 2  . FOOT SURGERY     03-24-14 Plantar Fasciatitis  . FOOT SURGERY Left    foot surgery 09-29-14  . groin cyst removed  2000   right  . LAPAROSCOPIC CHOLECYSTECTOMY  2003    Review of systems negative except as noted in HPI / PMHx or noted below:  Review of Systems  Constitutional: Negative.   HENT: Negative.   Eyes: Negative.   Respiratory: Negative.   Cardiovascular: Negative.   Gastrointestinal: Negative.   Genitourinary: Negative.   Musculoskeletal: Negative.   Skin: Negative.   Neurological: Negative.   Endo/Heme/Allergies: Negative.   Psychiatric/Behavioral: Negative.      Objective:   Vitals:   04/11/20 1558  BP: 110/70  Pulse: (!) 59  Resp: 18  Temp: 98.7 F (37.1 C)  SpO2: 97%          Physical Exam Constitutional:      Appearance: She is not diaphoretic.  HENT:     Head: Normocephalic.     Right Ear: Tympanic membrane, ear canal and external ear normal.     Left Ear: Tympanic membrane, ear canal and external ear normal.     Nose: Nose normal. No mucosal edema or rhinorrhea.     Mouth/Throat:     Pharynx: Uvula midline. No oropharyngeal exudate.  Eyes:     Conjunctiva/sclera: Conjunctivae normal.  Neck:     Thyroid: No thyromegaly.     Trachea: Trachea normal. No tracheal tenderness or tracheal deviation.  Cardiovascular:     Rate and Rhythm: Normal rate and regular rhythm.     Heart sounds: Normal heart sounds, S1 normal and S2 normal. No murmur heard.   Pulmonary:     Effort: No respiratory distress.     Breath sounds: Normal breath sounds. No stridor. No wheezing or rales.  Lymphadenopathy:     Head:     Right side of head: No tonsillar adenopathy.     Left side of head: No tonsillar adenopathy.     Cervical: No cervical adenopathy.  Skin:    Findings: No erythema or rash.     Nails: There is no clubbing.  Neurological:     Mental Status: She is alert.      Diagnostics:    Spirometry was performed and demonstrated an FEV1 of 2.29 at 90 % of predicted.  The patient had an Asthma Control Test with the following results: ACT Total Score: 25.    Assessment and Plan:   1. Asthma, moderate persistent, well-controlled   2. Other allergic rhinitis   3. LPRD (laryngopharyngeal reflux disease)     1.  Continue to perform Allergen avoidance measures - grass / weed  2.  Continue to Treat and prevent inflammation:   A.  OTC Nasocort - 1 spray each nostril 1-7 times a week  B.  Montelukast  10 mg - 1 tablet 1 time per day   3.  Continue to treat reflux:   A.  omeprazole 40 mg tablet 1 time per day  4. If needed:   A.  Nasal saline multiple times a day  B.  OTC cetirizine 10-20 mg 1 time per day  C.  Pro Air HFA or similar 2 inhalations every 4-6 hours  D.  OTC Mucinex DM 2 tablets twice a day  5. Obtain fall flu vaccine and COVID booster vaccine  6.  Can restart Symbicort 160 -2 inhalations 1-2 times per day with spacer depending on asthma disease activity.  7. Return to clinic in 12 months or earlier if problem  Carol Chambers has really done well throughout each season of the year over the course of the past year.  She has a very good understanding of her disease state and how her medications work and appropriate dosing of her medications depending on disease activity.  She will continue to use a leukotriene modifier and some other anti-inflammatory medications for her airway should it be required and she will continue to treat her reflux with the proton pump inhibitor.  Assuming she does well with this plan I will see her back in this clinic in 1 year or earlier if there is a problem.  Allena Katz, MD Allergy / Immunology [place]

## 2020-04-12 ENCOUNTER — Encounter: Payer: Self-pay | Admitting: Allergy and Immunology

## 2020-06-05 ENCOUNTER — Other Ambulatory Visit: Payer: Self-pay | Admitting: Family Medicine

## 2020-08-01 ENCOUNTER — Encounter: Payer: Self-pay | Admitting: Obstetrics and Gynecology

## 2020-09-05 ENCOUNTER — Encounter: Payer: Self-pay | Admitting: Family Medicine

## 2020-09-06 ENCOUNTER — Other Ambulatory Visit: Payer: Self-pay | Admitting: Family Medicine

## 2020-09-06 ENCOUNTER — Encounter: Payer: Self-pay | Admitting: Family Medicine

## 2020-09-06 DIAGNOSIS — E034 Atrophy of thyroid (acquired): Secondary | ICD-10-CM

## 2020-09-06 NOTE — Progress Notes (Signed)
Has been dieting and lost 40lb since July.  She reports palpitations, hair thinning, insomnia.  No biotin containing products.  Takes MVI.  Pt to come in for full panel.  Orders Placed This Encounter  Procedures  . Thyroid Panel With TSH    Standing Status:   Future    Standing Expiration Date:   09/06/2021

## 2020-09-07 ENCOUNTER — Other Ambulatory Visit: Payer: BC Managed Care – PPO

## 2020-09-07 ENCOUNTER — Other Ambulatory Visit: Payer: Self-pay

## 2020-09-07 DIAGNOSIS — E034 Atrophy of thyroid (acquired): Secondary | ICD-10-CM

## 2020-09-08 ENCOUNTER — Encounter: Payer: Self-pay | Admitting: Family Medicine

## 2020-09-08 LAB — THYROID PANEL WITH TSH
Free Thyroxine Index: 1.5 (ref 1.2–4.9)
T3 Uptake Ratio: 26 % (ref 24–39)
T4, Total: 5.9 ug/dL (ref 4.5–12.0)
TSH: 7 u[IU]/mL — ABNORMAL HIGH (ref 0.450–4.500)

## 2020-09-11 ENCOUNTER — Other Ambulatory Visit: Payer: Self-pay | Admitting: Family Medicine

## 2020-09-11 DIAGNOSIS — E034 Atrophy of thyroid (acquired): Secondary | ICD-10-CM

## 2020-09-11 DIAGNOSIS — R7989 Other specified abnormal findings of blood chemistry: Secondary | ICD-10-CM

## 2020-09-20 ENCOUNTER — Telehealth: Payer: Self-pay | Admitting: Allergy and Immunology

## 2020-09-20 ENCOUNTER — Other Ambulatory Visit: Payer: Self-pay | Admitting: *Deleted

## 2020-09-20 ENCOUNTER — Telehealth: Payer: Self-pay | Admitting: *Deleted

## 2020-09-20 ENCOUNTER — Encounter: Payer: Self-pay | Admitting: Allergy and Immunology

## 2020-09-20 MED ORDER — PREDNISONE 10 MG PO TABS: ORAL_TABLET | ORAL | 0 refills | Status: DC

## 2020-09-20 MED ORDER — BUDESONIDE-FORMOTEROL FUMARATE 160-4.5 MCG/ACT IN AERO
INHALATION_SPRAY | RESPIRATORY_TRACT | 0 refills | Status: DC
Start: 2020-09-20 — End: 2020-09-21

## 2020-09-20 NOTE — Telephone Encounter (Signed)
PA has been submitted through CoverMyMeds for generic Symbicort 160 and is currently pending approval/denial.

## 2020-09-20 NOTE — Telephone Encounter (Signed)
Patient called and said that she talk to a nurse and he said that he was calling a inhaler and prednisone . The inhaler was called in but not the prednisone was not [phone].

## 2020-09-20 NOTE — Telephone Encounter (Signed)
There is a trend on this information already being addressed by Dr. Ernst Bowler.

## 2020-09-20 NOTE — Telephone Encounter (Signed)
Sent in refill for Symbicort to [facility].

## 2020-09-20 NOTE — Addendum Note (Signed)
Addended by: Valentina Shaggy on: 09/20/2020 03:39 PM   Modules accepted: Orders

## 2020-09-21 ENCOUNTER — Other Ambulatory Visit: Payer: Self-pay | Admitting: *Deleted

## 2020-09-21 MED ORDER — SYMBICORT 160-4.5 MCG/ACT IN AERO
INHALATION_SPRAY | RESPIRATORY_TRACT | 5 refills | Status: DC
Start: 2020-09-21 — End: 2021-04-10

## 2020-09-21 NOTE — Telephone Encounter (Signed)
Patient's insurance company called and stated that brand name Symbicort is preferred. I resent the prescription asking to dispense name brand.I called and advised the patient. Patient verbalized understanding.

## 2020-10-18 ENCOUNTER — Other Ambulatory Visit: Payer: Self-pay

## 2020-10-19 ENCOUNTER — Encounter: Payer: BC Managed Care – PPO | Admitting: Obstetrics and Gynecology

## 2020-10-19 ENCOUNTER — Encounter: Payer: Self-pay | Admitting: Nurse Practitioner

## 2020-10-19 ENCOUNTER — Ambulatory Visit (INDEPENDENT_AMBULATORY_CARE_PROVIDER_SITE_OTHER): Payer: BC Managed Care – PPO | Admitting: Nurse Practitioner

## 2020-10-19 VITALS — BP 126/82 | Ht 62.5 in | Wt 146.0 lb

## 2020-10-19 DIAGNOSIS — Z01419 Encounter for gynecological examination (general) (routine) without abnormal findings: Secondary | ICD-10-CM | POA: Diagnosis not present

## 2020-10-19 DIAGNOSIS — Z1231 Encounter for screening mammogram for malignant neoplasm of breast: Secondary | ICD-10-CM | POA: Diagnosis not present

## 2020-10-19 NOTE — Patient Instructions (Signed)

## 2020-10-19 NOTE — Progress Notes (Signed)
   Carol Chambers 04/10/1961 [id number]   History:  60 y.o. G1P0010 presents for annual exam without GYN complaints. Postmenopausal - no HRT, no bleeding. Normal pap and mammogram history. History of hypothyroidism and migraines.   Gynecologic History Patient's last menstrual period was 05/26/2014.   Contraception/Family planning: post menopausal status  Health Maintenance Last Pap: 10/19/2019. Results were: normal Last mammogram: 10/19/2019. Results were: normal Last colonoscopy: 2014. Results were: normal, 10-year recall Last Dexa:  Never  Past medical history, past surgical history, family history and social history were all reviewed and documented in the EPIC chart.  ROS:  A ROS was performed and pertinent positives and negatives are included.  Exam:  Vitals:   10/19/20 0853  BP: 126/82  Weight: 146 lb (66.2 kg)  Height: 5' 2.5" (1.588 m)   Body mass index is 26.28 kg/m.  General appearance:  Normal Thyroid:  Symmetrical, normal in size, without palpable masses or nodularity. Respiratory  Auscultation:  Clear without wheezing or rhonchi Cardiovascular  Auscultation:  Regular rate, without rubs, murmurs or gallops  Edema/varicosities:  Not grossly evident Abdominal  Soft,nontender, without masses, guarding or rebound.  Liver/spleen:  No organomegaly noted  Hernia:  None appreciated  Skin  Inspection:  Grossly normal   Breasts: Examined lying and sitting.   Right: Without masses, retractions, discharge or axillary adenopathy.   Left: Without masses, retractions, discharge or axillary adenopathy. Gentitourinary   Inguinal/mons:  Normal without inguinal adenopathy  External genitalia:  Normal  BUS/Urethra/Skene's glands:  Normal  Vagina:  Normal  Cervix:  Normal  Uterus:  Normal in size, shape and contour.  Midline and mobile  Adnexa/parametria:     Rt: Without masses or tenderness.   Lt: Without masses or tenderness.  Anus and perineum: Normal  Digital rectal  exam: Normal sphincter tone without palpated masses or tenderness  Assessment/Plan:  60 y.o. G1P0010 for annual exam.   Well female exam with routine gynecological exam - Education provided on SBEs, importance of preventative screenings, current guidelines, high calcium diet, regular exercise, and multivitamin daily. Labs with PCP.   Screening for cervical cancer - Normal Pap history.  Will repeat at 5-year interval per guidelines.  Screening for breast cancer - Normal mammogram history.  Continue annual screenings.  Normal breast exam today. Mammogram scheduled for today.   Screening for colon cancer - 2014 colonoscopy. Will repeat at GI's recommended interval.   Return in 1 year for annual.    Tamela Gammon [facility], 9:00 AM 10/19/2020

## 2020-10-20 ENCOUNTER — Ambulatory Visit: Payer: BC Managed Care – PPO | Admitting: Endocrinology

## 2020-10-20 ENCOUNTER — Encounter: Payer: Self-pay | Admitting: Endocrinology

## 2020-10-20 ENCOUNTER — Other Ambulatory Visit: Payer: Self-pay

## 2020-10-20 VITALS — BP 100/64 | HR 89 | Ht 61.0 in | Wt 149.2 lb

## 2020-10-20 DIAGNOSIS — E039 Hypothyroidism, unspecified: Secondary | ICD-10-CM

## 2020-10-20 LAB — T4, FREE: Free T4: 0.75 ng/dL (ref 0.60–1.60)

## 2020-10-20 LAB — TSH: TSH: 7.41 u[IU]/mL — ABNORMAL HIGH (ref 0.35–4.50)

## 2020-10-20 MED ORDER — LEVOTHYROXINE SODIUM 100 MCG PO TABS: 100.0000 ug | ORAL_TABLET | Freq: Every day | ORAL | 3 refills | Status: DC

## 2020-10-20 NOTE — Progress Notes (Signed)
Subjective:    Patient ID: Carol Chambers, female    DOB: Aug 06, 1961, 60 y.o.   MRN: [id number]  HPI Pt is referred by Dr Stan Head, for hypothyroidism.  Pt reports hypothyroidism was dx'ed in 1996.  she has been on prescribed thyroid hormone therapy since dx.  she has never taken kelp or any other type of non-prescribed thyroid product.  she has never had thyroid imaging.  She has never had thyroid surgery, or XRT to the neck.  she has never been on amiodarone or lithium.  She reports insomnia, difficulty with concentration, mild depression, cold intolerance, and hair loss.  She has lost 41 lbs x 8 mos--intentional.  She has been on current 88 mcg/d, since late 2020.   Past Medical History:  Diagnosis Date  . Acid reflux   . Anemia   . ASCUS of cervix with negative high risk HPV 10/2018  . Asthma   . History of kidney stones   . Hypercholesteremia   . Hypothyroidism   . PONV (postoperative nausea and vomiting)    with Cholecystectomy  . Seasonal allergies     Past Surgical History:  Procedure Laterality Date  . BRAVO [place] STUDY N/A 08/23/2014   Procedure: BRAVO [place] STUDY;  Surgeon: Inda Castle, MD;  [facility]: [facility];  Service: Endoscopy;  Laterality: N/A;  48 hour bravo pH study with impedance plethysmography   . BRAVO [place] STUDY N/A 10/10/2014   Procedure: BRAVO [place] STUDY;  Surgeon: Inda Castle, MD;  Location: [facility];  Service: Endoscopy;  Laterality: N/A;  2nd attempt  . CARPAL TUNNEL RELEASE  2003   bilat  . ESOPHAGOGASTRODUODENOSCOPY (EGD) WITH PROPOFOL N/A 08/23/2014   Procedure: ESOPHAGOGASTRODUODENOSCOPY (EGD) WITH PROPOFOL;  Surgeon: Inda Castle, MD;  [facility];  Service: Endoscopy;  Laterality: N/A;  . ESOPHAGOGASTRODUODENOSCOPY (EGD) WITH PROPOFOL N/A 10/10/2014   Procedure: ESOPHAGOGASTRODUODENOSCOPY (EGD) WITH PROPOFOL;  Surgeon: Inda Castle, MD;  Location[facility];  Service: Endoscopy;  Laterality: N/A;  . Esophagus stretched     X  2  . FOOT SURGERY     03-24-14 Plantar Fasciatitis  . FOOT SURGERY Left    foot surgery 09-29-14  . groin cyst removed  2000   right  . LAPAROSCOPIC CHOLECYSTECTOMY  2003    Social History   Socioeconomic History  . Marital status: Single    Spouse name: Not on file  . Number of children: 0  . Years of education: Not on file  . Highest education level: Not on file  Occupational History  . Not on file  Tobacco Use  . Smoking status: Never Smoker  . Smokeless tobacco: Never Used  Vaping Use  . Vaping Use: Never used  Substance and Sexual Activity  . Alcohol use: Yes    Alcohol/week: 0.0 standard drinks    Comment: Rare  . Drug use: No  . Sexual activity: Yes    Birth control/protection: Post-menopausal    Comment: 1st intercourse 54 yo-5 partners  Other Topics Concern  . Not on file  Social History Narrative   Legally separated from spouse, amicable relationship. Reside in separate households   No children   Works at [employer] of [employer]: Not on [employer] Insecurity: Not on file  Transportation Needs: Not on file  Physical Activity: Not on file  Stress: Not on file  Social Connections: Not on file  Intimate Partner Violence: Not on  file    Current Outpatient Medications on File Prior to Visit  Medication Sig Dispense Refill  . Cholecalciferol (VITAMIN D PO) Take by mouth.    . cyclobenzaprine (FLEXERIL) 10 MG tablet Take 1 tablet (10 mg total) by mouth 3 (three) times daily as needed. for muscle spams 60 tablet 0  . cycloSPORINE (RESTASIS) 0.05 % ophthalmic emulsion Restasis 0.05 % eye drops in a dropperette    . Magnesium 250 MG TABS Take 2 tablets by mouth daily.    . montelukast (SINGULAIR) 10 MG tablet Take 1 tablet (10 mg total) by mouth at bedtime. 90 tablet 1  . omeprazole (PRILOSEC) 40 MG capsule Take 1 capsule (40 mg total) by mouth daily. 90 capsule 3  . simvastatin (ZOCOR) 20 MG tablet Take 1  tablet (20 mg total) by mouth at bedtime. 90 tablet 3  . SYMBICORT 160-4.5 MCG/ACT inhaler Use 2 puffs 1-2 times daily as directed. 10.2 g 5  . topiramate (TOPAMAX) 50 MG tablet Take 1 tablet (50 mg total) by mouth 2 (two) times daily. 180 tablet 3  . Triamcinolone Acetonide (NASACORT AQ NA) Place 1 spray into the nose 2 (two) times daily. Uses as needed.     No current facility-administered medications on file prior to visit.    Allergies  Allergen Reactions  . Sulfa Antibiotics Hives  . Tequin Swelling    Swelling of tongue  . Codeine Itching  . Vicodin [Hydrocodone-Acetaminophen] Other (See Comments)    Hallucinations     Family History  Problem Relation Age of Onset  . Cancer Mother 34       Uterine  . Thyroid disease Mother   . Cancer Father        Lung  . Diabetes Maternal Grandfather   . Heart disease Maternal Grandfather   . Breast cancer Maternal Grandmother        Age 36's  . Colon cancer Neg Hx   . Esophageal cancer Neg Hx   . Rectal cancer Neg Hx   . Stomach cancer Neg Hx   . Allergic rhinitis Neg Hx   . Asthma Neg Hx   . Eczema Neg Hx   . Urticaria Neg Hx   . Angioedema Neg Hx   . Atopy Neg Hx   . Immunodeficiency Neg Hx     BP 100/64 (BP Location: Right Arm, Patient Position: Sitting, Cuff Size: Normal)   Pulse 89   Ht '5\' 1"'  (1.549 m)   Wt 149 lb 3.2 oz (67.7 kg)   LMP 05/26/2014   SpO2 96%   BMI 28.19 kg/m     Review of Systems denies constipation, numbness, and dry skin.       Objective:   Physical Exam VS: see vs page GEN: no distress HEAD: head: no deformity eyes: no periorbital swelling, no proptosis external nose and ears are normal NECK: supple, thyroid is not enlarged CHEST WALL: no deformity LUNGS: clear to auscultation CV: reg rate and rhythm, no murmur.  MUSCULOSKELETAL: gait is normal and steady EXTEMITIES: no deformity.  no leg edema NEURO:  readily moves all 4's.  sensation is intact to touch on all 4's SKIN:  Normal  texture and temperature.  No rash or suspicious lesion is visible.   NODES:  None palpable at the neck.  PSYCH: alert, well-oriented.  Does not appear anxious nor depressed.    Lab Results  Component Value Date   TSH 7.41 (H) 10/20/2020   T4TOTAL 5.9 09/07/2020   I  have reviewed outside records, and summarized: Pt was noted to have elevated TSH, and referred here.  She was also seen for wellness visit, and general health was good      Assessment & Plan:  Hypothyroidism, new to me, uncontrolled.  I have sent a prescription to your pharmacy, to increase synthroid Recheck TFT 30d Ret here prn.

## 2020-10-20 NOTE — Patient Instructions (Addendum)
Blood tests are requested for you today.  We'll let you know about the results.   I would be happy to see you back here as needed.      Levothyroxine tablets What is this medicine? LEVOTHYROXINE (lee voe thye ROX een) is a thyroid hormone. This medicine can improve symptoms of thyroid deficiency such as slow speech, lack of energy, weight gain, hair loss, dry skin, and feeling cold. It also helps to treat goiter (an enlarged thyroid gland). It is also used to treat some kinds of thyroid cancer along with surgery and other medicines. This medicine may be used for other purposes; ask your health care provider or pharmacist if you have questions. COMMON BRAND NAME(S): Estre, Euthyrox, Levo-T, Levothroid, Levoxyl, Synthroid, Thyro-Tabs, Unithroid What should I tell my health care provider before I take this medicine? They need to know if you have any of these conditions:  Addison's disease or other adrenal gland problem  angina  bone problems  diabetes  dieting or on a weight loss program  fertility problems  heart disease  pituitary gland problem  take medicines that treat or prevent blood clots  an unusual or allergic reaction to levothyroxine, thyroid hormones, other medicines, foods, dyes, or preservatives  pregnant or trying to get pregnant  breast-feeding How should I use this medicine? Take this medicine by mouth with plenty of water. It is best to take on an empty stomach, at least 30 minutes to one hour before breakfast. Avoid taking antacids containing aluminum or magnesium, simethicone, bile acid sequestrants, calcium carbonate, sodium polystyrene sulfonate, ferrous sulfate, sevelamer, lanthanum, or sucralfate within 4 hours of taking this medicine. Follow the directions on the prescription label. Take at the same time each day. Do not take your medicine more often than directed. Contact your pediatrician regarding the use of this medicine in children. While this drug may  be prescribed for children and infants as young as a few days of age for selected conditions, precautions do apply. For infants, you may crush the tablet and place in a small amount of (5 to 10 mL or 1 to 2 teaspoonfuls) of water, breast milk, or non-soy based infant formula. Do not mix with soy-based infant formula. Give as directed. Overdosage: If you think you have taken too much of this medicine contact a poison control center or emergency room at once. NOTE: This medicine is only for you. Do not share this medicine with others. What if I miss a dose? If you miss a dose, take it as soon as you can. If it is almost time for your next dose, take only that dose. Do not take double or extra doses. What may interact with this medicine?  amiodarone  antacids  anti-thyroid medicines  calcium supplements  carbamazepine  certain medicines for depression  certain medicines to treat cancer  cholestyramine  clofibrate  colesevelam  colestipol  digoxin  female hormones, like estrogens and birth control pills, patches, rings, or injections  iron supplements  ketamine  lanthanum  liquid nutrition products like Ensure  lithium  medicines for colds and breathing difficulties  medicines for diabetes  medicines or dietary supplements for weight loss  methadone  niacin  orlistat  oxandrolone  phenobarbital or other barbiturates  phenytoin  rifampin  sevelamer  simethicone  sodium polystyrene sulfonate  soy isoflavones  steroid medicines like prednisone or cortisone  sucralfate  testosterone  theophylline  warfarin This list may not describe all possible interactions. Give your health care provider  a list of all the medicines, herbs, non-prescription drugs, or dietary supplements you use. Also tell them if you smoke, drink alcohol, or use illegal drugs. Some items may interact with your medicine. What should I watch for while using this medicine? Be  sure to take this medicine with plenty of fluids. Some tablets may cause choking, gagging, or difficulty swallowing from the tablet getting stuck in your throat. Most of these problems disappear if the medicine is taken with the right amount of water or other fluids. Do not switch brands of this medicine unless your health care professional agrees with the change. Ask questions if you are uncertain. You will need regular exams and occasional blood tests to check the response to treatment. If you are receiving this medicine for an underactive thyroid, it may be several weeks before you notice an improvement. Check with your doctor or health care professional if your symptoms do not improve. It may be necessary for you to take this medicine for the rest of your life. Do not stop using this medicine unless your doctor or health care professional advises you to. This medicine can affect blood sugar levels. If you have diabetes, check your blood sugar as directed. You may lose some of your hair when you first start treatment. With time, this usually corrects itself. If you are going to have surgery, tell your doctor or health care professional that you are taking this medicine. What side effects may I notice from receiving this medicine? Side effects that you should report to your doctor or health care professional as soon as possible:  allergic reactions like skin rash, itching or hives, swelling of the face, lips, or tongue  anxious  breathing problems  changes in menstrual periods  chest pain  diarrhea  excessive sweating or intolerance to heat  fast or irregular heartbeat  leg cramps  nervousness  swelling of ankles, feet, or legs  tremors  trouble sleeping  vomiting Side effects that usually do not require medical attention (report to your doctor or health care professional if they continue or are bothersome):  changes in appetite  headache  irritable  nausea  weight  loss This list may not describe all possible side effects. Call your doctor for medical advice about side effects. You may report side effects to FDA at 1-800-FDA-1088. Where should I keep my medicine? Keep out of the reach of children. Store at room temperature between 15 and 30 degrees C (59 and 86 degrees F). Protect from light and moisture. Keep container tightly closed. Throw away any unused medicine after the expiration date. NOTE: This sheet is a summary. It may not cover all possible information. If you have questions about this medicine, talk to your doctor, pharmacist, or health care provider.  2021 Elsevier/Gold Standard (2019-07-22 13:39:26)

## 2020-10-21 ENCOUNTER — Encounter: Payer: Self-pay | Admitting: Endocrinology

## 2020-11-02 ENCOUNTER — Encounter: Payer: Self-pay | Admitting: Family Medicine

## 2020-11-07 ENCOUNTER — Other Ambulatory Visit: Payer: Self-pay | Admitting: Endocrinology

## 2020-11-07 ENCOUNTER — Encounter: Payer: Self-pay | Admitting: Endocrinology

## 2020-11-07 DIAGNOSIS — E039 Hypothyroidism, unspecified: Secondary | ICD-10-CM

## 2020-11-07 DIAGNOSIS — R002 Palpitations: Secondary | ICD-10-CM | POA: Insufficient documentation

## 2020-11-10 ENCOUNTER — Other Ambulatory Visit (INDEPENDENT_AMBULATORY_CARE_PROVIDER_SITE_OTHER): Payer: BC Managed Care – PPO

## 2020-11-10 ENCOUNTER — Other Ambulatory Visit: Payer: Self-pay

## 2020-11-10 ENCOUNTER — Encounter: Payer: Self-pay | Admitting: Endocrinology

## 2020-11-10 DIAGNOSIS — E039 Hypothyroidism, unspecified: Secondary | ICD-10-CM | POA: Diagnosis not present

## 2020-11-10 DIAGNOSIS — R002 Palpitations: Secondary | ICD-10-CM

## 2020-11-10 LAB — TSH: TSH: 4.44 u[IU]/mL (ref 0.35–4.50)

## 2020-11-10 LAB — T4, FREE: Free T4: 0.92 ng/dL (ref 0.60–1.60)

## 2020-11-19 LAB — CATECHOLAMINES, FRACTIONATED, PLASMA
Dopamine: <10 pg/mL
Epinephrine: 23 pg/mL
Norepinephrine: 821 pg/mL
Total Catecholamines: 844 pg/mL

## 2020-11-21 ENCOUNTER — Other Ambulatory Visit: Payer: BC Managed Care – PPO

## 2020-11-26 ENCOUNTER — Other Ambulatory Visit: Payer: Self-pay | Admitting: Allergy and Immunology

## 2020-11-27 ENCOUNTER — Other Ambulatory Visit: Payer: Self-pay

## 2021-01-15 ENCOUNTER — Other Ambulatory Visit: Payer: Self-pay | Admitting: Family Medicine

## 2021-01-15 DIAGNOSIS — G8929 Other chronic pain: Secondary | ICD-10-CM

## 2021-02-07 ENCOUNTER — Other Ambulatory Visit: Payer: Self-pay | Admitting: Allergy and Immunology

## 2021-02-10 DIAGNOSIS — H5213 Myopia, bilateral: Secondary | ICD-10-CM | POA: Diagnosis not present

## 2021-03-20 ENCOUNTER — Other Ambulatory Visit: Payer: Self-pay | Admitting: Family Medicine

## 2021-03-20 DIAGNOSIS — G43009 Migraine without aura, not intractable, without status migrainosus: Secondary | ICD-10-CM

## 2021-03-22 ENCOUNTER — Encounter: Payer: Self-pay | Admitting: Family Medicine

## 2021-03-22 ENCOUNTER — Other Ambulatory Visit: Payer: Self-pay | Admitting: Family Medicine

## 2021-03-22 DIAGNOSIS — G43009 Migraine without aura, not intractable, without status migrainosus: Secondary | ICD-10-CM

## 2021-03-22 MED ORDER — TOPIRAMATE 50 MG PO TABS: 50.0000 mg | ORAL_TABLET | Freq: Two times a day (BID) | ORAL | 0 refills | Status: DC

## 2021-04-10 ENCOUNTER — Ambulatory Visit: Payer: BC Managed Care – PPO | Admitting: Allergy and Immunology

## 2021-04-10 ENCOUNTER — Encounter: Payer: Self-pay | Admitting: Allergy and Immunology

## 2021-04-10 ENCOUNTER — Other Ambulatory Visit: Payer: Self-pay

## 2021-04-10 VITALS — BP 100/70 | HR 62 | Temp 98.2°F | Resp 16 | Ht 62.0 in | Wt 155.4 lb

## 2021-04-10 DIAGNOSIS — J301 Allergic rhinitis due to pollen: Secondary | ICD-10-CM | POA: Diagnosis not present

## 2021-04-10 DIAGNOSIS — J4540 Moderate persistent asthma, uncomplicated: Secondary | ICD-10-CM

## 2021-04-10 DIAGNOSIS — K219 Gastro-esophageal reflux disease without esophagitis: Secondary | ICD-10-CM

## 2021-04-10 MED ORDER — MONTELUKAST SODIUM 10 MG PO TABS: 10.0000 mg | ORAL_TABLET | Freq: Every day | ORAL | 3 refills | Status: DC

## 2021-04-10 MED ORDER — SYMBICORT 160-4.5 MCG/ACT IN AERO: INHALATION_SPRAY | RESPIRATORY_TRACT | 11 refills | Status: DC

## 2021-04-10 NOTE — Progress Notes (Signed)
[facility] [place] [facility] [place]   Follow-up Note  Referring Provider: Janora Norlander, DO Primary Provider: Janora Norlander, DO Date of Office Visit: 04/10/2021  Subjective:   Carol Chambers (DOB: 07/21/1961) is a 60 y.o. female who returns to the [facility] on 04/10/2021 in re-evaluation of the following:  HPI: Carol Chambers returns to this clinic in reevaluation of asthma and allergic rhinitis and LPR.  Her last visit to this clinic was 11 April 2020.  She contracted COVID in February 2022 requiring the administration of a low-dose of steroid from this clinic and did relatively well without any long-term sequela.  Otherwise, she has really done well and rarely uses a short acting bronchodilator while she continues on montelukast on a consistent basis and she uses Symbicort during the spring.  For the most part she does not need to use Symbicort other times of the year.  Likewise, her nose has really done very well other than during the spring at which point in time she needs to use a nasal steroid on a pretty frequent basis.  Overall she is very satisfied with the response that she receives throughout the year while utilizing this plan.  Her reflux is doing well as has her throat issue while she uses omeprazole on a consistent basis every day.  She has had 3 COVID vaccines and as noted above was infected with COVID in February 2022.  Allergies as of 04/10/2021       Reactions   Sulfa Antibiotics Hives   Tequin Swelling   Swelling of tongue   Codeine Itching   Vicodin [hydrocodone-acetaminophen] Other (See Comments)   Hallucinations        Medication List    cyclobenzaprine 10 MG tablet Commonly known as: FLEXERIL TAKE 1 TABLET THREE TIMES A DAY AS NEEDED FOR MUSCLE SPASMS   cycloSPORINE 0.05 % ophthalmic emulsion Commonly known as: RESTASIS Restasis 0.05 % eye drops in a dropperette   levothyroxine 100 MCG tablet Commonly  known as: SYNTHROID Take 1 tablet (100 mcg total) by mouth daily.   Magnesium 250 MG Tabs Take 2 tablets by mouth daily.   montelukast 10 MG tablet Commonly known as: SINGULAIR TAKE 1 TABLET AT BEDTIME   NASACORT AQ NA Place 1 spray into the nose 2 (two) times daily. Uses as needed.   omeprazole 40 MG capsule Commonly known as: PRILOSEC Take 1 capsule (40 mg total) by mouth daily.   simvastatin 20 MG tablet Commonly known as: ZOCOR Take 1 tablet (20 mg total) by mouth at bedtime.   Symbicort 160-4.5 MCG/ACT inhaler Generic drug: budesonide-formoterol Use 2 puffs 1-2 times daily as directed.   topiramate 50 MG tablet Commonly known as: Topamax Take 1 tablet (50 mg total) by mouth 2 (two) times daily.   VITAMIN D PO Take by mouth.    Past Medical History:  Diagnosis Date   Acid reflux    Anemia    ASCUS of cervix with negative high risk HPV 10/2018   Asthma    History of kidney stones    Hypercholesteremia    Hypothyroidism    PONV (postoperative nausea and vomiting)    with Cholecystectomy   Seasonal allergies     Past Surgical History:  Procedure Laterality Date   BRAVO [place] STUDY N/A 08/23/2014   Procedure: BRAVO [place] STUDY;  Surgeon: Inda Castle, MD;  Location: [facility];  Service: Endoscopy;  Laterality: N/A;  48 hour  bravo pH study with impedance plethysmography    BRAVO [place] STUDY N/A 10/10/2014   Procedure: BRAVO [place] STUDY;  Surgeon: Inda Castle, MD;  Location: [facility];  Service: Endoscopy;  Laterality: N/A;  2nd attempt   CARPAL TUNNEL RELEASE  2003   bilat   ESOPHAGOGASTRODUODENOSCOPY (EGD) WITH PROPOFOL N/A 08/23/2014   Procedure: ESOPHAGOGASTRODUODENOSCOPY (EGD) WITH PROPOFOL;  Surgeon: Inda Castle, MD;  Location: [facility];  Service: Endoscopy;  Laterality: N/A;   ESOPHAGOGASTRODUODENOSCOPY (EGD) WITH PROPOFOL N/A 10/10/2014   Procedure: ESOPHAGOGASTRODUODENOSCOPY (EGD) WITH PROPOFOL;  Surgeon: Inda Castle, MD;  Location: [facility];  Service: Endoscopy;  Laterality: N/A;   Esophagus stretched     X 2   FOOT SURGERY     03-24-14 Plantar Fasciatitis   FOOT SURGERY Left    foot surgery 09-29-14   groin cyst removed  2000   right   LAPAROSCOPIC CHOLECYSTECTOMY  2003    Review of systems negative except as noted in HPI / PMHx or noted below:  Review of Systems  Constitutional: Negative.   HENT: Negative.    Eyes: Negative.   Respiratory: Negative.    Cardiovascular: Negative.   Gastrointestinal: Negative.   Genitourinary: Negative.   Musculoskeletal: Negative.   Skin: Negative.   Neurological: Negative.   Endo/Heme/Allergies: Negative.   Psychiatric/Behavioral: Negative.      Objective:   Vitals:   04/10/21 1050 04/10/21 1122  BP: 100/70 100/70  Pulse: 71 62  Resp: 16 16  Temp: 98.2 F (36.8 C) 98.2 F (36.8 C)  SpO2: 99% 99%   Height: '5\' 2"'$  (157.5 cm)  Weight: 155 lb 6.4 oz (70.5 kg)   Physical Exam Constitutional:      Appearance: She is not diaphoretic.  HENT:     Head: Normocephalic.     Right Ear: Tympanic membrane, ear canal and external ear normal.     Left Ear: Tympanic membrane, ear canal and external ear normal.     Nose: Nose normal. No mucosal edema or rhinorrhea.     Mouth/Throat:     Pharynx: Uvula midline. No oropharyngeal exudate.  Eyes:     Conjunctiva/sclera: Conjunctivae normal.  Neck:     Thyroid: No thyromegaly.     Trachea: Trachea normal. No tracheal tenderness or tracheal deviation.  Cardiovascular:     Rate and Rhythm: Normal rate and regular rhythm.     Heart sounds: Normal heart sounds, S1 normal and S2 normal. No murmur heard. Pulmonary:     Effort: No respiratory distress.     Breath sounds: Normal breath sounds. No stridor. No wheezing or rales.  Lymphadenopathy:     Head:     Right side of head: No tonsillar adenopathy.     Left side of head: No tonsillar adenopathy.     Cervical: No cervical adenopathy.  Skin:    Findings: No erythema or  rash.     Nails: There is no clubbing.  Neurological:     Mental Status: She is alert.    Diagnostics:    Spirometry was performed and demonstrated an FEV1 of 2.20 at 93 % of predicted.  Assessment and Plan:   1. Asthma, moderate persistent, well-controlled   2. Seasonal allergic rhinitis due to pollen   3. LPRD (laryngopharyngeal reflux disease)     1.  Continue to perform Allergen avoidance measures - grass / weed  2.  Continue to Treat and prevent inflammation:   A.  OTC Nasocort - 1 spray  each nostril 1-2 times a day ([place])  B.  Montelukast 10 mg - 1 tablet 1 time per day   C.  Symbicort 160 -2 inhalations 1-2 times per day with spacer ([place])  3.  Continue to treat reflux:   A.  omeprazole 40 mg tablet 1 time per day  4. If needed:   A.  Nasal saline multiple times a day  B.  OTC cetirizine 10-20 mg 1 time per day  C.  Pro Air HFA or similar 2 inhalations every 4-6 hours  D.  OTC Mucinex DM 2 tablets twice a day  5. Obtain fall flu vaccine    6.  Return to clinic in 12 months or earlier if problem  Ruben appears to be doing very well.  She has a very good understanding of her disease state and how her medications work and the appropriate dosing of her medications depending on disease activity.  We will refill her medications utilized for inflammation of her airway and her reflux as noted above and I will see her back in this clinic in 12 months or earlier if there is a problem.  Allena Katz, MD Allergy / Immunology [place]

## 2021-04-10 NOTE — Patient Instructions (Addendum)
  1.  Continue to perform Allergen avoidance measures - grass / weed  2.  Continue to Treat and prevent inflammation:   A.  OTC Nasocort - 1 spray each nostril 1-2 times a day ([place])  B.  Montelukast 10 mg - 1 tablet 1 time per day   C.  Symbicort 160 -2 inhalations 1-2 times per day with spacer ([place])  3.  Continue to treat reflux:   A.  omeprazole 40 mg tablet 1 time per day  4. If needed:   A.  Nasal saline multiple times a day  B.  OTC cetirizine 10-20 mg 1 time per day  C.  Pro Air HFA or similar 2 inhalations every 4-6 hours  D.  OTC Mucinex DM 2 tablets twice a day  5. Obtain fall flu vaccine    6.  Return to clinic in 12 months or earlier if problem 

## 2021-04-11 ENCOUNTER — Encounter: Payer: Self-pay | Admitting: Allergy and Immunology

## 2021-04-20 ENCOUNTER — Ambulatory Visit: Payer: BC Managed Care – PPO | Admitting: Family Medicine

## 2021-04-20 ENCOUNTER — Other Ambulatory Visit: Payer: Self-pay

## 2021-04-20 ENCOUNTER — Encounter: Payer: Self-pay | Admitting: Family Medicine

## 2021-04-20 VITALS — BP 105/61 | HR 73 | Temp 98.0°F | Ht 62.0 in | Wt 151.8 lb

## 2021-04-20 DIAGNOSIS — Z5181 Encounter for therapeutic drug level monitoring: Secondary | ICD-10-CM

## 2021-04-20 DIAGNOSIS — E034 Atrophy of thyroid (acquired): Secondary | ICD-10-CM | POA: Diagnosis not present

## 2021-04-20 DIAGNOSIS — K219 Gastro-esophageal reflux disease without esophagitis: Secondary | ICD-10-CM

## 2021-04-20 DIAGNOSIS — G43009 Migraine without aura, not intractable, without status migrainosus: Secondary | ICD-10-CM | POA: Diagnosis not present

## 2021-04-20 DIAGNOSIS — Z23 Encounter for immunization: Secondary | ICD-10-CM

## 2021-04-20 DIAGNOSIS — B360 Pityriasis versicolor: Secondary | ICD-10-CM

## 2021-04-20 DIAGNOSIS — E782 Mixed hyperlipidemia: Secondary | ICD-10-CM

## 2021-04-20 MED ORDER — TOPIRAMATE 50 MG PO TABS: 50.0000 mg | ORAL_TABLET | Freq: Two times a day (BID) | ORAL | 3 refills | Status: DC

## 2021-04-20 MED ORDER — SIMVASTATIN 20 MG PO TABS: 20.0000 mg | ORAL_TABLET | Freq: Every day | ORAL | 3 refills | Status: DC

## 2021-04-20 MED ORDER — OMEPRAZOLE 40 MG PO CPDR: 40.0000 mg | DELAYED_RELEASE_CAPSULE | Freq: Every day | ORAL | 3 refills | Status: DC

## 2021-04-20 MED ORDER — LEVOTHYROXINE SODIUM 100 MCG PO TABS: 100.0000 ug | ORAL_TABLET | Freq: Every day | ORAL | 3 refills | Status: DC

## 2021-04-20 MED ORDER — KETOCONAZOLE 2 % EX SHAM: MEDICATED_SHAMPOO | CUTANEOUS | 1 refills | Status: DC

## 2021-04-20 NOTE — Progress Notes (Signed)
Subjective: CC: Migraine headaches, hypothyroidism, rash PCP: Janora Norlander, DO JYL:TEIH W Brayton El is a 60 y.o. female presenting to clinic today for:  1.  Migraine headaches Patient is compliant with Topamax 50 mg twice daily.  She reports good control of migraine symptoms with this.  Denies any foggy headedness, difficulty with speech.  2.  Hypothyroidism Patient was seen by Dr. Loanne Drilling earlier this year.  Her Synthroid was adjusted to 100 mcg daily.  No other changes needed.  She reports being asymptomatic.  No change in bowel habits, difficulty swallowing, heart palpitations or tremor  3.  Rash Patient reports a rash on her back.  She notes this happens every couple of years and is asking for treatment.  4.  Hyperlipidemia Prescribed Zocor.  She is fasting.   ROS: Per HPI  Allergies  Allergen Reactions   Sulfa Antibiotics Hives   Tequin Swelling    Swelling of tongue   Codeine Itching   Vicodin [Hydrocodone-Acetaminophen] Other (See Comments)    Hallucinations    Past Medical History:  Diagnosis Date   Acid reflux    Anemia    ASCUS of cervix with negative high risk HPV 10/2018   Asthma    History of kidney stones    Hypercholesteremia    Hypothyroidism    PONV (postoperative nausea and vomiting)    with Cholecystectomy   Seasonal allergies     Current Outpatient Medications:    Cholecalciferol (VITAMIN D PO), Take by mouth. (Patient not taking: Reported on 04/10/2021), Disp: , Rfl:    cyclobenzaprine (FLEXERIL) 10 MG tablet, TAKE 1 TABLET THREE TIMES A DAY AS NEEDED FOR MUSCLE SPASMS, Disp: 180 tablet, Rfl: 5   cycloSPORINE (RESTASIS) 0.05 % ophthalmic emulsion, Restasis 0.05 % eye drops in a dropperette, Disp: , Rfl:    levothyroxine (SYNTHROID) 100 MCG tablet, Take 1 tablet (100 mcg total) by mouth daily., Disp: 90 tablet, Rfl: 3   Magnesium 250 MG TABS, Take 2 tablets by mouth daily., Disp: , Rfl:    montelukast (SINGULAIR) 10 MG tablet, Take 1  tablet (10 mg total) by mouth at bedtime., Disp: 90 tablet, Rfl: 3   omeprazole (PRILOSEC) 40 MG capsule, Take 1 capsule (40 mg total) by mouth daily., Disp: 90 capsule, Rfl: 3   simvastatin (ZOCOR) 20 MG tablet, Take 1 tablet (20 mg total) by mouth at bedtime., Disp: 90 tablet, Rfl: 3   SYMBICORT 160-4.5 MCG/ACT inhaler, Use 2 puffs 1-2 times daily as directed., Disp: 10.2 g, Rfl: 11   topiramate (TOPAMAX) 50 MG tablet, Take 1 tablet (50 mg total) by mouth 2 (two) times daily., Disp: 60 tablet, Rfl: 0   Triamcinolone Acetonide (NASACORT AQ NA), Place 1 spray into the nose 2 (two) times daily. Uses as needed., Disp: , Rfl:  Social History   Socioeconomic History   Marital status: Single    Spouse name: Not on file   Number of children: 0   Years of education: Not on file   Highest education level: Not on file  Occupational History   Not on file  Tobacco Use   Smoking status: Never   Smokeless tobacco: Never  Vaping Use   Vaping Use: Never used  Substance and Sexual Activity   Alcohol use: Yes    Alcohol/week: 0.0 standard drinks    Comment: Rare   Drug use: No   Sexual activity: Yes    Birth control/protection: Post-menopausal    Comment: 1st intercourse 27 yo-5 partners  Other Topics Concern   Not on file  Social History Narrative   Legally separated from spouse, amicable relationship. Reside in separate households   No children   Works at [employer] Strain: Not on [employer] Insecurity: Not on file  Transportation Needs: Not on file  Physical Activity: Not on file  Stress: Not on file  Social Connections: Not on file  Intimate Partner Violence: Not on file   Family History  Problem Relation Age of Onset   Cancer Mother 68       Uterine   Thyroid disease Mother    Cancer Father        Lung   Diabetes Maternal Grandfather    Heart disease Maternal Grandfather    Breast cancer Maternal Grandmother         Age 59's   Colon cancer Neg Hx    Esophageal cancer Neg Hx    Rectal cancer Neg Hx    Stomach cancer Neg Hx    Allergic rhinitis Neg Hx    Asthma Neg Hx    Eczema Neg Hx    Urticaria Neg Hx    Angioedema Neg Hx    Atopy Neg Hx    Immunodeficiency Neg Hx     Objective: Office vital signs reviewed. BP 105/61   Pulse 73   Temp 98 F (36.7 C)   Ht '5\' 2"'  (1.575 m)   Wt 151 lb 12.8 oz (68.9 kg)   LMP 05/26/2014   BMI 27.76 kg/m   Physical Examination:  General: Awake, alert, well nourished, No acute distress HEENT: Normal; sclera white.  No exophthalmos.  No goiter Cardio: regular rate and rhythm, S1S2 heard, no murmurs appreciated Pulm: clear to auscultation bilaterally, no wheezes, rhonchi or rales; normal work of breathing on room air Skin: multiple annular areas of hypopigmentation noted along the bra line on her back. Neuro: No focal neurologic deficits.  Cranial nerves II through XII grossly intact.  Alert and oriented  Assessment/ Plan: 60 y.o. female   Migraine without aura and without status migrainosus, not intractable - Plan: CBC, topiramate (TOPAMAX) 50 MG tablet  Hypothyroidism due to acquired atrophy of thyroid - Plan: TSH, T4, Free, levothyroxine (SYNTHROID) 100 MCG tablet  Mixed hyperlipidemia - Plan: CMP14+EGFR, Lipid Panel, simvastatin (ZOCOR) 20 MG tablet  Medication monitoring encounter - Plan: CBC  Tinea versicolor - Plan: ketoconazole (NIZORAL) 2 % shampoo  Gastroesophageal reflux disease without esophagitis - Plan: omeprazole (PRILOSEC) 40 MG capsule  Migraine headaches are stable.  Continue Topamax.  Check CMP, CBC  Asymptomatic on thyroid standpoint.  Last levels in April were within normal range.  Wants refills sent to mail order and assessment done.  Check TSH, free T4  She is fasting.  Currently treated with Zocor.  Check fasting lipid panel, CMP  The rash on back is clean consistent with tinea versicolor.  Ketoconazole prescribed.   Instructions for use discussed.  Handout provided.  Follow-up as needed  GERD was not discussed.  Refills needed.  Rx sent  Follow-up in 1 year for annual physical with fasting labs, sooner if concerns arise.  Tetanus shot was administered today  No orders of the defined types were placed in this encounter.  No orders of the defined types were placed in this encounter.    Janora Norlander, DO [place] [phone]

## 2021-04-20 NOTE — Addendum Note (Signed)
Addended by: Christia Reading on: 04/20/2021 08:43 AM   Modules accepted: Orders

## 2021-04-20 NOTE — Patient Instructions (Addendum)
You had labs performed today.  You will be contacted with the results of the labs once they are available, usually in the next 3 business days for routine lab work.  If you have an active my chart account, they will be released to your MyChart.  If you prefer to have these labs released to you via telephone, please let us know.  If you had a pap smear or biopsy performed, expect to be contacted in about 7-10 days. Tinea Versicolor Tinea versicolor is a skin infection. It is caused by a type of yeast. It is normal for some yeast to be on your skin, but too much yeast causes this infection. The infection causes a rash of light or dark patches on your skin. The rash is most common on the chest, back, neck, or upper arms. The infection usually does not cause other problems. If it is treated, it will probably go away in a few weeks. The infection cannot be spread from one person to another (is notcontagious). What are the causes? This condition is caused by a certain type of yeast that starts to grow too much on your skin. What increases the risk? Heat and humidity. Sweating too much. Hormone changes. This may happen when taking birth control pills. Oily skin. A weak disease-fighting system (immunesystem). What are the signs or symptoms? A rash of light or dark patches on your skin. The rash may have: Patches of tan or pink spots (on light skin). Patches of white or brown spots (on dark skin). Patches of skin that do not tan. Well-marked edges. Scales. Mild itching. There may also be no itching. How is this treated? Treatment for this condition may include: Dandruff shampoo. The shampoo may be used on the affected skin during showers or baths. Over-the-counter medicated skin cream, lotion, or soaps. Prescription antifungal medicine. This may include cream or pills. Medicine to help your itching. Follow these instructions at home: Use over-the-counter and prescription medicines only as told by  your doctor. Wash your skin with dandruff shampoo as told by your doctor. Do not scratch your skin in the rash area. Avoid places that are hot and humid. Do not use tanning booths. Try to avoid sweating a lot. Contact a doctor if: Your symptoms get worse. You have a fever. You have signs of infection such as: Redness, swelling, or pain in the rash area. Warmth coming from your rash. Fluid or blood coming from your rash. Pus or a bad smell coming from your rash. Your rash comes back (recurs) after treatment. Your rash does not improve with treatment. Your rash spreads to other parts of the body. Summary Tinea versicolor is a skin infection. It causes a rash of light or dark patches on your skin. The rash is most common on the chest, back, neck, or upper arms. This infection usually does not cause other problems. Use over-the-counter and prescription medicines only as told by your doctor. If the infection is treated, it will probably go away in a few weeks. This information is not intended to replace advice given to you by your health care provider. Make sure you discuss any questions you have with your health care provider. Document Revised: 10/17/2020 Document Reviewed: 10/17/2020 Elsevier Patient Education  [place].

## 2021-04-21 LAB — CBC
Hematocrit: 41.5 % (ref 34.0–46.6)
Hemoglobin: 14.2 g/dL (ref 11.1–15.9)
MCH: 31.7 pg (ref 26.6–33.0)
MCHC: 34.2 g/dL (ref 31.5–35.7)
MCV: 93 fL (ref 79–97)
Platelets: 212 10*3/uL (ref 150–450)
RBC: 4.48 x10E6/uL (ref 3.77–5.28)
RDW: 12.2 % (ref 11.7–15.4)
WBC: 5.3 10*3/uL (ref 3.4–10.8)

## 2021-04-21 LAB — LIPID PANEL
Chol/HDL Ratio: 2.7 ratio (ref 0.0–4.4)
Cholesterol, Total: 221 mg/dL — ABNORMAL HIGH (ref 100–199)
HDL: 81 mg/dL (ref >39)
LDL Chol Calc (NIH): 123 mg/dL — ABNORMAL HIGH (ref 0–99)
Triglycerides: 98 mg/dL (ref 0–149)
VLDL Cholesterol Cal: 17 mg/dL (ref 5–40)

## 2021-04-21 LAB — CMP14+EGFR
ALT: 15 IU/L (ref 0–32)
AST: 23 IU/L (ref 0–40)
Albumin/Globulin Ratio: 2.4 — ABNORMAL HIGH (ref 1.2–2.2)
Albumin: 4.7 g/dL (ref 3.8–4.9)
Alkaline Phosphatase: 81 IU/L (ref 44–121)
BUN/Creatinine Ratio: 14 (ref 9–23)
BUN: 14 mg/dL (ref 6–24)
Bilirubin Total: 0.6 mg/dL (ref 0.0–1.2)
CO2: 23 mmol/L (ref 20–29)
Calcium: 9.6 mg/dL (ref 8.7–10.2)
Chloride: 106 mmol/L (ref 96–106)
Creatinine, Ser: 1.01 mg/dL — ABNORMAL HIGH (ref 0.57–1.00)
Globulin, Total: 2 g/dL (ref 1.5–4.5)
Glucose: 93 mg/dL (ref 65–99)
Potassium: 4.6 mmol/L (ref 3.5–5.2)
Sodium: 142 mmol/L (ref 134–144)
Total Protein: 6.7 g/dL (ref 6.0–8.5)
eGFR: 65 mL/min/{1.73_m2} (ref >59)

## 2021-04-21 LAB — T4, FREE: Free T4: 1.25 ng/dL (ref 0.82–1.77)

## 2021-04-21 LAB — TSH: TSH: 2.38 u[IU]/mL (ref 0.450–4.500)

## 2021-05-22 ENCOUNTER — Other Ambulatory Visit: Payer: Self-pay

## 2021-05-22 ENCOUNTER — Encounter: Payer: Self-pay | Admitting: Nurse Practitioner

## 2021-05-22 ENCOUNTER — Encounter: Payer: Self-pay | Admitting: Family Medicine

## 2021-05-22 ENCOUNTER — Ambulatory Visit: Payer: BC Managed Care – PPO | Admitting: Nurse Practitioner

## 2021-05-22 DIAGNOSIS — R300 Dysuria: Secondary | ICD-10-CM | POA: Diagnosis not present

## 2021-05-22 DIAGNOSIS — N390 Urinary tract infection, site not specified: Secondary | ICD-10-CM

## 2021-05-22 NOTE — Progress Notes (Signed)
   Virtual Visit  Note Due to COVID-19 pandemic this visit was conducted virtually. This visit type was conducted due to national recommendations for restrictions regarding the COVID-19 Pandemic (e.g. social distancing, sheltering in place) in an effort to limit this patient's exposure and mitigate transmission in our community. All issues noted in this document were discussed and addressed.  A physical exam was not performed with this format.  I connected with Carol Chambers on 05/22/21 at 12.50 pm by telephone and verified that I am speaking with the correct person using two identifiers. Carol Chambers is currently located at home during visit. The provider, Ivy Lynn, NP is located in their office at time of visit.  I discussed the limitations, risks, security and privacy concerns of performing an evaluation and management service by telephone and the availability of in person appointments. I also discussed with the patient that there may be a patient responsible charge related to this service. The patient expressed understanding and agreed to proceed.   History and Present Illness:  Dysuria  This is a new problem. The current episode started yesterday. The problem occurs intermittently. The problem has been unchanged. The patient is experiencing no pain. There has been no fever. Associated symptoms include nausea. Pertinent negatives include no chills, frequency, urgency or vomiting. She has tried nothing for the symptoms.     Review of Systems  Constitutional:  Negative for chills and fever.  Gastrointestinal:  Positive for nausea. Negative for vomiting.  Genitourinary:  Positive for dysuria. Negative for frequency and urgency.  Musculoskeletal:  Positive for back pain.  Skin:  Negative for rash.    Observations/Objective: Tele-visit patient not in distress  Assessment and Plan: Urinalysis completed results pending.   Follow Up Instructions:  Follow up with worsening  unresolved symptoms    I discussed the assessment and treatment plan with the patient. The patient was provided an opportunity to ask questions and all were answered. The patient agreed with the plan and demonstrated an understanding of the instructions.   The patient was advised to call back or seek an in-person evaluation if the symptoms worsen or if the condition fails to improve as anticipated.  The above assessment and management plan was discussed with the patient. The patient verbalized understanding of and has agreed to the management plan. Patient is aware to call the clinic if symptoms persist or worsen. Patient is aware when to return to the clinic for a follow-up visit. Patient educated on when it is appropriate to go to the emergency department.   Time call ended:  12:56 pm   I provided 6 minutes of  non face-to-face time during this encounter.    Ivy Lynn, NP

## 2021-05-22 NOTE — Assessment & Plan Note (Signed)
Urinalysis completed , results pending

## 2021-05-23 ENCOUNTER — Other Ambulatory Visit: Payer: Self-pay

## 2021-05-23 ENCOUNTER — Other Ambulatory Visit: Payer: BC Managed Care – PPO

## 2021-05-23 ENCOUNTER — Encounter: Payer: Self-pay | Admitting: Family Medicine

## 2021-05-23 ENCOUNTER — Other Ambulatory Visit: Payer: Self-pay | Admitting: Nurse Practitioner

## 2021-05-23 DIAGNOSIS — N390 Urinary tract infection, site not specified: Secondary | ICD-10-CM | POA: Diagnosis not present

## 2021-05-23 LAB — URINALYSIS, ROUTINE W REFLEX MICROSCOPIC
Bilirubin, UA: NEGATIVE
Glucose, UA: NEGATIVE
Ketones, UA: NEGATIVE
Nitrite, UA: NEGATIVE
Protein,UA: NEGATIVE
RBC, UA: NEGATIVE
Specific Gravity, UA: 1.015 (ref 1.005–1.030)
Urobilinogen, Ur: 0.2 mg/dL (ref 0.2–1.0)
pH, UA: 7 (ref 5.0–7.5)

## 2021-05-23 LAB — MICROSCOPIC EXAMINATION: RBC, Urine: NONE SEEN /hpf (ref 0–2)

## 2021-05-24 ENCOUNTER — Other Ambulatory Visit: Payer: Self-pay | Admitting: Family Medicine

## 2021-05-24 ENCOUNTER — Encounter: Payer: Self-pay | Admitting: Family Medicine

## 2021-05-24 ENCOUNTER — Other Ambulatory Visit: Payer: Self-pay | Admitting: Nurse Practitioner

## 2021-05-24 DIAGNOSIS — N3000 Acute cystitis without hematuria: Secondary | ICD-10-CM

## 2021-05-24 MED ORDER — CEPHALEXIN 500 MG PO CAPS: 500.0000 mg | ORAL_CAPSULE | Freq: Two times a day (BID) | ORAL | 0 refills | Status: AC

## 2021-05-24 MED ORDER — FLUCONAZOLE 150 MG PO TABS: 150.0000 mg | ORAL_TABLET | Freq: Once | ORAL | 0 refills | Status: AC

## 2021-05-24 NOTE — Telephone Encounter (Signed)
I took care of this. Abx sent

## 2021-06-05 ENCOUNTER — Encounter: Payer: Self-pay | Admitting: Allergy and Immunology

## 2021-06-11 ENCOUNTER — Encounter: Payer: Self-pay | Admitting: Family Medicine

## 2021-06-11 ENCOUNTER — Other Ambulatory Visit: Payer: Self-pay | Admitting: *Deleted

## 2021-06-11 ENCOUNTER — Telehealth: Payer: BC Managed Care – PPO | Admitting: Family Medicine

## 2021-06-11 DIAGNOSIS — R058 Other specified cough: Secondary | ICD-10-CM | POA: Diagnosis not present

## 2021-06-11 DIAGNOSIS — Z6826 Body mass index (BMI) 26.0-26.9, adult: Secondary | ICD-10-CM | POA: Diagnosis not present

## 2021-06-11 DIAGNOSIS — H6502 Acute serous otitis media, left ear: Secondary | ICD-10-CM | POA: Diagnosis not present

## 2021-06-11 MED ORDER — AMOXICILLIN 500 MG PO TABS: 500.0000 mg | ORAL_TABLET | Freq: Two times a day (BID) | ORAL | 0 refills | Status: AC

## 2021-06-11 MED ORDER — PREDNISONE 10 MG PO TABS: 10.0000 mg | ORAL_TABLET | Freq: Every day | ORAL | 0 refills | Status: AC

## 2021-09-03 ENCOUNTER — Telehealth (INDEPENDENT_AMBULATORY_CARE_PROVIDER_SITE_OTHER): Payer: BC Managed Care – PPO | Admitting: Family Medicine

## 2021-09-03 DIAGNOSIS — N3000 Acute cystitis without hematuria: Secondary | ICD-10-CM

## 2021-09-03 MED ORDER — CEPHALEXIN 500 MG PO CAPS: 500.0000 mg | ORAL_CAPSULE | Freq: Two times a day (BID) | ORAL | 0 refills | Status: AC

## 2021-09-03 MED ORDER — FLUCONAZOLE 150 MG PO TABS: 150.0000 mg | ORAL_TABLET | Freq: Once | ORAL | 0 refills | Status: AC

## 2021-09-03 NOTE — Telephone Encounter (Signed)
Telephone visit  Subjective: CC: UTi PCP: Janora Norlander, DO QQP:YPPJ Carol Chambers is a 61 y.o. female calls for telephone consult today. Patient provides verbal consent for consult held via phone.  Due to COVID-19 pandemic this visit was conducted virtually. This visit type was conducted due to national recommendations for restrictions regarding the COVID-19 Pandemic (e.g. social distancing, sheltering in place) in an effort to limit this patient's exposure and mitigate transmission in our community. All issues noted in this document were discussed and addressed.  A physical exam was not performed with this format.   Location of patient: home Location of provider: [facility] Others present for call: none  1. Urinary symptoms Patient reports a 1 week h/o pressure, urinary frequency, urgency.  She reports burning at the end of her stream.  No hematuria, fevers, chills, abdominal pain, nausea, vomiting, vaginal discharge.  Patient has used AZO for symptoms.  She is hydrating with water.  Patient does not a h/o frequent or recurrent UTIs.  Last urinary tract infection was October 2022.  No urine culture was obtained at that time.  Her last urine culture dates back to February 2017 which showed relatively wide sensitivity to everything except for ampicillin.  No contraindications to cephalosporin use.  ROS: Per HPI  Allergies  Allergen Reactions   Sulfa Antibiotics Hives   Tequin Swelling    Swelling of tongue   Codeine Itching   Vicodin [Hydrocodone-Acetaminophen] Other (See Comments)    Hallucinations    Past Medical History:  Diagnosis Date   Acid reflux    Anemia    ASCUS of cervix with negative high risk HPV 10/2018   Asthma    History of kidney stones    Hypercholesteremia    Hypothyroidism    PONV (postoperative nausea and vomiting)    with Cholecystectomy   Seasonal allergies     Current Outpatient Medications:    Cholecalciferol (VITAMIN D PO), Take by mouth. (Patient  not taking: Reported on 04/20/2021), Disp: , Rfl:    cyclobenzaprine (FLEXERIL) 10 MG tablet, TAKE 1 TABLET THREE TIMES A DAY AS NEEDED FOR MUSCLE SPASMS, Disp: 180 tablet, Rfl: 5   cycloSPORINE (RESTASIS) 0.05 % ophthalmic emulsion, Restasis 0.05 % eye drops in a dropperette, Disp: , Rfl:    ketoconazole (NIZORAL) 2 % shampoo, Lather affected areas.  Leave on for 10-15 minutes and then rinse.  Use twice weekly until rash resolved., Disp: 120 mL, Rfl: 1   levothyroxine (SYNTHROID) 100 MCG tablet, Take 1 tablet (100 mcg total) by mouth daily., Disp: 90 tablet, Rfl: 3   Magnesium 250 MG TABS, Take 2 tablets by mouth daily., Disp: , Rfl:    montelukast (SINGULAIR) 10 MG tablet, Take 1 tablet (10 mg total) by mouth at bedtime., Disp: 90 tablet, Rfl: 3   omeprazole (PRILOSEC) 40 MG capsule, Take 1 capsule (40 mg total) by mouth daily., Disp: 90 capsule, Rfl: 3   simvastatin (ZOCOR) 20 MG tablet, Take 1 tablet (20 mg total) by mouth at bedtime., Disp: 90 tablet, Rfl: 3   SYMBICORT 160-4.5 MCG/ACT inhaler, Use 2 puffs 1-2 times daily as directed., Disp: 10.2 g, Rfl: 11   topiramate (TOPAMAX) 50 MG tablet, Take 1 tablet (50 mg total) by mouth 2 (two) times daily., Disp: 180 tablet, Rfl: 3   Triamcinolone Acetonide (NASACORT AQ NA), Place 1 spray into the nose 2 (two) times daily. Uses as needed., Disp: , Rfl:   Assessment/ Plan: 61 y.o. female   Acute cystitis without  hematuria - Plan: cephALEXin (KEFLEX) 500 MG capsule, fluconazole (DIFLUCAN) 150 MG tablet  Her urine symptoms sound very suspicious for urinary tract infection.  Empiric treatment with Keflex twice daily for the next 7 days.  Diflucan sent in should she need it.  Encourage p.o. hydration.  Nothing sounding like it is a complicated UTI at this point but she understands that if symptoms or not significant improving within the next couple of days I want her to come in on Wednesday for urine sample and evaluation.  She voiced good understanding  will follow up as needed  Start time: 5:36pm End time: 5:41pm  Total time spent on patient care (including telephone call/ virtual visit): 5 minutes  [place], [place] [phone]

## 2021-09-05 ENCOUNTER — Ambulatory Visit: Payer: BC Managed Care – PPO | Admitting: Family Medicine

## 2021-09-05 ENCOUNTER — Encounter: Payer: Self-pay | Admitting: Family Medicine

## 2021-09-05 VITALS — BP 106/72 | HR 78 | Temp 97.9°F | Ht 62.0 in | Wt 155.2 lb

## 2021-09-05 DIAGNOSIS — R34 Anuria and oliguria: Secondary | ICD-10-CM | POA: Diagnosis not present

## 2021-09-05 DIAGNOSIS — N390 Urinary tract infection, site not specified: Secondary | ICD-10-CM | POA: Diagnosis not present

## 2021-09-05 DIAGNOSIS — N3000 Acute cystitis without hematuria: Secondary | ICD-10-CM | POA: Diagnosis not present

## 2021-09-05 LAB — URINALYSIS, ROUTINE W REFLEX MICROSCOPIC
Bilirubin, UA: NEGATIVE
Glucose, UA: NEGATIVE
Ketones, UA: NEGATIVE
Nitrite, UA: NEGATIVE
Protein,UA: NEGATIVE
RBC, UA: NEGATIVE
Specific Gravity, UA: 1.015 (ref 1.005–1.030)
Urobilinogen, Ur: 0.2 mg/dL (ref 0.2–1.0)
pH, UA: 8.5 — ABNORMAL HIGH (ref 5.0–7.5)

## 2021-09-05 LAB — MICROSCOPIC EXAMINATION
RBC, Urine: NONE SEEN /hpf (ref 0–2)
WBC, UA: >30 /hpf — AB (ref 0–5)

## 2021-09-05 MED ORDER — NITROFURANTOIN MONOHYD MACRO 100 MG PO CAPS: 100.0000 mg | ORAL_CAPSULE | Freq: Two times a day (BID) | ORAL | 0 refills | Status: AC

## 2021-09-05 NOTE — Progress Notes (Signed)
Subjective: TI:RWERXVQ symptoms PCP: Janora Norlander, DO MGQ:QPYP W Brayton El is a 61 y.o. female presenting to clinic today for:  1. Urinary symptoms Patient was seen by televisit 2 days ago for urinary symptoms.  At that time she reported a 1 week history of pressure, urinary frequency and urgency as well some dysuria at the end of her stream.  She was started on Keflex twice daily.  Today she reports that she still has some ongoing issues with evacuating her bladder.  She has been having pelvic pressure still.  No hematuria.  Mild right lower quadrant discomfort.  She does have history of renal stone but notes that this does not feel like her typical renal stone.  She is no longer seeing a urologist.  No reports of flank pain, nausea, vomiting or fevers.  No vaginal discharge reported.    ROS: Per HPI  Allergies  Allergen Reactions   Sulfa Antibiotics Hives   Tequin Swelling    Swelling of tongue   Codeine Itching   Vicodin [Hydrocodone-Acetaminophen] Other (See Comments)    Hallucinations    Past Medical History:  Diagnosis Date   Acid reflux    Anemia    ASCUS of cervix with negative high risk HPV 10/2018   Asthma    History of kidney stones    Hypercholesteremia    Hypothyroidism    PONV (postoperative nausea and vomiting)    with Cholecystectomy   Seasonal allergies     Current Outpatient Medications:    cephALEXin (KEFLEX) 500 MG capsule, Take 1 capsule (500 mg total) by mouth 2 (two) times daily for 7 days., Disp: 14 capsule, Rfl: 0   cyclobenzaprine (FLEXERIL) 10 MG tablet, TAKE 1 TABLET THREE TIMES A DAY AS NEEDED FOR MUSCLE SPASMS, Disp: 180 tablet, Rfl: 5   cycloSPORINE (RESTASIS) 0.05 % ophthalmic emulsion, Restasis 0.05 % eye drops in a dropperette, Disp: , Rfl:    ketoconazole (NIZORAL) 2 % shampoo, Lather affected areas.  Leave on for 10-15 minutes and then rinse.  Use twice weekly until rash resolved., Disp: 120 mL, Rfl: 1   levothyroxine (SYNTHROID)  100 MCG tablet, Take 1 tablet (100 mcg total) by mouth daily., Disp: 90 tablet, Rfl: 3   Magnesium 250 MG TABS, Take 2 tablets by mouth daily., Disp: , Rfl:    montelukast (SINGULAIR) 10 MG tablet, Take 1 tablet (10 mg total) by mouth at bedtime., Disp: 90 tablet, Rfl: 3   omeprazole (PRILOSEC) 40 MG capsule, Take 1 capsule (40 mg total) by mouth daily., Disp: 90 capsule, Rfl: 3   simvastatin (ZOCOR) 20 MG tablet, Take 1 tablet (20 mg total) by mouth at bedtime., Disp: 90 tablet, Rfl: 3   SYMBICORT 160-4.5 MCG/ACT inhaler, Use 2 puffs 1-2 times daily as directed., Disp: 10.2 g, Rfl: 11   topiramate (TOPAMAX) 50 MG tablet, Take 1 tablet (50 mg total) by mouth 2 (two) times daily., Disp: 180 tablet, Rfl: 3   Triamcinolone Acetonide (NASACORT AQ NA), Place 1 spray into the nose 2 (two) times daily. Uses as needed., Disp: , Rfl:    Cholecalciferol (VITAMIN D PO), Take by mouth. (Patient not taking: Reported on 04/20/2021), Disp: , Rfl:  Social History   Socioeconomic History   Marital status: Single    Spouse name: Not on file   Number of children: 0   Years of education: Not on file   Highest education level: Not on file  Occupational History   Not on file  Tobacco Use   Smoking status: Never   Smokeless tobacco: Never  Vaping Use   Vaping Use: Never used  Substance and Sexual Activity   Alcohol use: Yes    Alcohol/week: 0.0 standard drinks    Comment: Rare   Drug use: No   Sexual activity: Yes    Birth control/protection: Post-menopausal    Comment: 1st intercourse 63 yo-5 partners  Other Topics Concern   Not on file  Social History Narrative   Legally separated from spouse, amicable relationship. Reside in separate households   No children   Works at [employer] Strain: Not on [employer] Insecurity: Not on file  Transportation Needs: Not on file  Physical Activity: Not on file  Stress: Not on file  Social Connections:  Not on file  Intimate Partner Violence: Not on file   Family History  Problem Relation Age of Onset   Cancer Mother 18       Uterine   Thyroid disease Mother    Cancer Father        Lung   Diabetes Maternal Grandfather    Heart disease Maternal Grandfather    Breast cancer Maternal Grandmother        Age 8's   Colon cancer Neg Hx    Esophageal cancer Neg Hx    Rectal cancer Neg Hx    Stomach cancer Neg Hx    Allergic rhinitis Neg Hx    Asthma Neg Hx    Eczema Neg Hx    Urticaria Neg Hx    Angioedema Neg Hx    Atopy Neg Hx    Immunodeficiency Neg Hx     Objective: Office vital signs reviewed. BP 106/72    Pulse 78    Temp 97.9 F (36.6 C)    Ht 5\' 2"  (1.575 m)    Wt 155 lb 3.2 oz (70.4 kg)    LMP 05/26/2014    SpO2 98%    BMI 28.39 kg/m   Physical Examination:  General: Awake, nontoxic female in no acute distress. GU: No suprapubic tenderness to palpation.  Bladder not palpable, not distended.  No CVA tenderness palpation  No results found for this or any previous visit (from the past 24 hour(s)).  Assessment/ Plan: 61 y.o. female   Decreased urine output  Acute cystitis without hematuria - Plan: Urinalysis, Routine w reflex microscopic, Urine Culture, nitrofurantoin, macrocrystal-monohydrate, (MACROBID) 100 MG capsule  I do worry that we may have her on the wrong antibiotic but she really has not been on it for a full 48 hours yet.  Advised her to wait until this afternoon and if she is really not seeing any improvement in urine output, I would like her to switch to [place].  I have ordered urine culture.  Hopefully she has left enough urine so that we can send this for testing.  I did consider renal stone amongst the differential diagnosis but her physical exam was totally unremarkable and her urinalysis did not show any evidence of sediment nor blood.  We discussed red flag signs and symptoms which would warrant further evaluation in the ER including inability to  urinate, flank or severe bladder pain, nausea, vomiting or fevers.  She voiced good understanding.  Orders Placed This Encounter  Procedures   Urinalysis, Routine w reflex microscopic   No orders of the defined types were placed in this encounter.    Janora Norlander, DO  [place] [phone]

## 2021-09-05 NOTE — Patient Instructions (Signed)
No evidence of stone on urinalysis.   Continue hydrating with water If no improvement in urinary symptoms by this evening, go ahead and switch to the other antibiotic. I'll try and culture your urine but it might not be enough to send  If your symptoms get worse, you develop fever, nausea, severe pain, go to ER.

## 2021-09-07 ENCOUNTER — Encounter: Payer: Self-pay | Admitting: Family Medicine

## 2021-09-07 LAB — URINE CULTURE: Organism ID, Bacteria: NO GROWTH

## 2021-10-22 ENCOUNTER — Other Ambulatory Visit: Payer: Self-pay

## 2021-10-22 ENCOUNTER — Encounter: Payer: Self-pay | Admitting: Nurse Practitioner

## 2021-10-22 ENCOUNTER — Ambulatory Visit (INDEPENDENT_AMBULATORY_CARE_PROVIDER_SITE_OTHER): Payer: BC Managed Care – PPO | Admitting: Nurse Practitioner

## 2021-10-22 VITALS — BP 124/82 | Ht 62.0 in | Wt 158.0 lb

## 2021-10-22 DIAGNOSIS — Z780 Asymptomatic menopausal state: Secondary | ICD-10-CM | POA: Diagnosis not present

## 2021-10-22 DIAGNOSIS — Z01419 Encounter for gynecological examination (general) (routine) without abnormal findings: Secondary | ICD-10-CM

## 2021-10-22 DIAGNOSIS — Z1231 Encounter for screening mammogram for malignant neoplasm of breast: Secondary | ICD-10-CM | POA: Diagnosis not present

## 2021-10-22 NOTE — Progress Notes (Signed)
? ?  CAASI GIGLIA November 27, 1960 [id number] ? ? ?History:  61 y.o. G1P0010 presents for annual exam without GYN complaints. Postmenopausal - no HRT, no bleeding. Normal pap and mammogram history. History of hypothyroidism and migraines.  ? ?Gynecologic History ?Patient's last menstrual period was 05/26/2014. ?  ?Contraception/Family planning: post menopausal status ?Sexually active: Yes ? ?Health Maintenance ?Last Pap: 10/19/2019. Results were: Normal, 5-year repeat ?Last mammogram: 10/19/2020. Results were: Normal. Scheduled today ?Last colonoscopy: 2014. Results were: Normal, 10-year recall ?Last Dexa:  Never ? ?Past medical history, past surgical history, family history and social history were all reviewed and documented in the EPIC chart. Boyfriend. Health and Education officer, environmental for [employer]. Mother with history of uterine cancer at age 34, MGM with history of breast cancer in her 17s.  ? ?ROS:  A ROS was performed and pertinent positives and negatives are included. ? ?Exam: ? ?Vitals:  ? 10/22/21 0923  ?BP: 124/82  ?Weight: 158 lb (71.7 kg)  ?Height: '5\' 2"'$  (1.575 m)  ? ? ?Body mass index is 28.9 kg/m?. ? ?General appearance:  Normal ?Thyroid:  Symmetrical, normal in size, without palpable masses or nodularity. ?Respiratory ? Auscultation:  Clear without wheezing or rhonchi ?Cardiovascular ? Auscultation:  Regular rate, without rubs, murmurs or gallops ? Edema/varicosities:  Not grossly evident ?Abdominal ? Soft,nontender, without masses, guarding or rebound. ? Liver/spleen:  No organomegaly noted ? Hernia:  None appreciated ? Skin ? Inspection:  Grossly normal ?  ?Breasts: Examined lying and sitting.  ? Right: Without masses, retractions, discharge or axillary adenopathy. ? ? Left: Without masses, retractions, discharge or axillary adenopathy. ?Gentitourinary  ? Inguinal/mons:  Normal without inguinal adenopathy ? External genitalia:  Normal ? BUS/Urethra/Skene's glands:  Normal ? Vagina:  Normal. Atrophic  changes ? Cervix:  Normal ? Uterus:  Normal in size, shape and contour.  Midline and mobile ? Adnexa/parametria:   ?  Rt: Without masses or tenderness. ?  Lt: Without masses or tenderness. ? Anus and perineum: Normal ? Digital rectal exam: Normal sphincter tone without palpated masses or tenderness ? ?Assessment/Plan:  61 y.o. G1P0010 for annual exam.  ? ?Well female exam with routine gynecological exam - Education provided on SBEs, importance of preventative screenings, current guidelines, high calcium diet, regular exercise, and multivitamin daily. Labs with PCP and through work.  ? ?Postmenopausal - no HRT, no bleeding ? ?Screening for cervical cancer - Normal Pap history.  Will repeat at 5-year interval per guidelines. ? ?Screening for breast cancer - Normal mammogram history.  Continue annual screenings.  Normal breast exam today. Mammogram scheduled for today.  ? ?Screening for colon cancer - 2014 colonoscopy. Will repeat at GI's recommended interval.  ? ?Screening for osteoporosis - Average risk. Normal Vitamin D levels, checked through work. Recommend high calcium diet or calcium supplement and regular exercise. Will plan for DXA at age 85.  ? ?Return in 1 year for annual.  ? ? ? ? ?Tamela Gammon [facility], 9:48 AM 10/22/2021 ? ?

## 2021-10-25 ENCOUNTER — Encounter: Payer: Self-pay | Admitting: Gynecology

## 2021-11-20 ENCOUNTER — Ambulatory Visit: Payer: BC Managed Care – PPO | Admitting: Obstetrics and Gynecology

## 2021-11-20 ENCOUNTER — Other Ambulatory Visit: Payer: BC Managed Care – PPO

## 2021-11-22 ENCOUNTER — Encounter: Payer: Self-pay | Admitting: Family Medicine

## 2021-11-22 ENCOUNTER — Other Ambulatory Visit: Payer: Self-pay | Admitting: *Deleted

## 2021-11-22 ENCOUNTER — Encounter: Payer: Self-pay | Admitting: Allergy and Immunology

## 2021-11-22 MED ORDER — MONTELUKAST SODIUM 10 MG PO TABS: 10.0000 mg | ORAL_TABLET | Freq: Every day | ORAL | 1 refills | Status: DC

## 2021-11-29 ENCOUNTER — Other Ambulatory Visit: Payer: Self-pay | Admitting: *Deleted

## 2021-11-29 ENCOUNTER — Other Ambulatory Visit: Payer: Self-pay

## 2021-11-29 MED ORDER — MONTELUKAST SODIUM 10 MG PO TABS: 10.0000 mg | ORAL_TABLET | Freq: Every day | ORAL | 1 refills | Status: DC

## 2021-12-05 ENCOUNTER — Other Ambulatory Visit: Payer: Self-pay | Admitting: Family Medicine

## 2021-12-05 DIAGNOSIS — G43009 Migraine without aura, not intractable, without status migrainosus: Secondary | ICD-10-CM

## 2021-12-05 DIAGNOSIS — G8929 Other chronic pain: Secondary | ICD-10-CM

## 2021-12-05 DIAGNOSIS — E782 Mixed hyperlipidemia: Secondary | ICD-10-CM

## 2021-12-05 DIAGNOSIS — E034 Atrophy of thyroid (acquired): Secondary | ICD-10-CM

## 2021-12-05 DIAGNOSIS — K219 Gastro-esophageal reflux disease without esophagitis: Secondary | ICD-10-CM

## 2021-12-05 MED ORDER — LEVOTHYROXINE SODIUM 100 MCG PO TABS: 100.0000 ug | ORAL_TABLET | Freq: Every day | ORAL | 3 refills | Status: DC

## 2021-12-05 MED ORDER — OMEPRAZOLE 40 MG PO CPDR: 40.0000 mg | DELAYED_RELEASE_CAPSULE | Freq: Every day | ORAL | 3 refills | Status: DC

## 2021-12-05 MED ORDER — TOPIRAMATE 50 MG PO TABS: 50.0000 mg | ORAL_TABLET | Freq: Two times a day (BID) | ORAL | 3 refills | Status: DC

## 2021-12-05 MED ORDER — CYCLOBENZAPRINE HCL 10 MG PO TABS: ORAL_TABLET | ORAL | 5 refills | Status: DC

## 2021-12-05 MED ORDER — SIMVASTATIN 20 MG PO TABS: 20.0000 mg | ORAL_TABLET | Freq: Every day | ORAL | 3 refills | Status: DC

## 2021-12-05 MED ORDER — MONTELUKAST SODIUM 10 MG PO TABS: 10.0000 mg | ORAL_TABLET | Freq: Every day | ORAL | 3 refills | Status: DC

## 2021-12-14 ENCOUNTER — Other Ambulatory Visit: Payer: Self-pay | Admitting: *Deleted

## 2021-12-14 DIAGNOSIS — M545 Low back pain, unspecified: Secondary | ICD-10-CM

## 2021-12-14 DIAGNOSIS — G43009 Migraine without aura, not intractable, without status migrainosus: Secondary | ICD-10-CM

## 2021-12-14 DIAGNOSIS — K219 Gastro-esophageal reflux disease without esophagitis: Secondary | ICD-10-CM

## 2021-12-14 DIAGNOSIS — E782 Mixed hyperlipidemia: Secondary | ICD-10-CM

## 2021-12-14 MED ORDER — SIMVASTATIN 20 MG PO TABS: 20.0000 mg | ORAL_TABLET | Freq: Every day | ORAL | 3 refills | Status: DC

## 2021-12-14 MED ORDER — TOPIRAMATE 50 MG PO TABS: 50.0000 mg | ORAL_TABLET | Freq: Two times a day (BID) | ORAL | 3 refills | Status: DC

## 2021-12-14 MED ORDER — OMEPRAZOLE 40 MG PO CPDR: 40.0000 mg | DELAYED_RELEASE_CAPSULE | Freq: Every day | ORAL | 3 refills | Status: DC

## 2021-12-14 MED ORDER — CYCLOBENZAPRINE HCL 10 MG PO TABS: ORAL_TABLET | ORAL | 0 refills | Status: DC

## 2022-05-07 ENCOUNTER — Encounter: Payer: Self-pay | Admitting: Family Medicine

## 2022-05-07 ENCOUNTER — Ambulatory Visit: Payer: BC Managed Care – PPO | Admitting: Family Medicine

## 2022-05-07 VITALS — BP 106/70 | HR 88 | Temp 98.1°F | Resp 16 | Ht 61.0 in | Wt 163.4 lb

## 2022-05-07 DIAGNOSIS — J4540 Moderate persistent asthma, uncomplicated: Secondary | ICD-10-CM | POA: Diagnosis not present

## 2022-05-07 DIAGNOSIS — J301 Allergic rhinitis due to pollen: Secondary | ICD-10-CM

## 2022-05-07 DIAGNOSIS — J45909 Unspecified asthma, uncomplicated: Secondary | ICD-10-CM | POA: Insufficient documentation

## 2022-05-07 DIAGNOSIS — K219 Gastro-esophageal reflux disease without esophagitis: Secondary | ICD-10-CM | POA: Diagnosis not present

## 2022-05-07 MED ORDER — SYMBICORT 160-4.5 MCG/ACT IN AERO: INHALATION_SPRAY | RESPIRATORY_TRACT | 11 refills | Status: DC

## 2022-05-07 MED ORDER — ALBUTEROL SULFATE HFA 108 (90 BASE) MCG/ACT IN AERS: 2.0000 | INHALATION_SPRAY | Freq: Four times a day (QID) | RESPIRATORY_TRACT | 2 refills | Status: DC | PRN

## 2022-05-07 MED ORDER — MONTELUKAST SODIUM 10 MG PO TABS: 10.0000 mg | ORAL_TABLET | Freq: Every day | ORAL | 11 refills | Status: DC

## 2022-05-07 NOTE — Progress Notes (Signed)
[place] [address] Dept: [phone]  FOLLOW UP NOTE  Patient ID: Carol Chambers, female    DOB: [birth year]  Age: 61 y.o. MRN: [id number] Date of Office Visit: 05/07/2022  Assessment  Chief Complaint: Cough (Dry cough with weather change )  HPI Carol Chambers is a 61 year old female who presents the clinic for follow-up visit.  She was last seen in this clinic on 04/10/2021 by Dr. Neldon Mc for evaluation of asthma, allergic rhinitis, and reflux.  At today's visit, she reports that her asthma has been moderately well controlled with dry cough that began about 2 to 3 weeks ago with the weather change.  Otherwise, she denies shortness of breath, cough, and wheeze with activity or rest.  She does report that about this time of year with the weather change she begins Symbicort 160-2 puffs once a day with a spacer with excellent relief of asthma symptoms.  She continues montelukast 10 mg once a day and has not used her albuterol since her last visit to this clinic.  Allergic rhinitis is reported as moderately well controlled with occasional nasal congestion as the main symptom.  She continues cetirizine 10 mg once a day and occasionally uses Nasacort with relief of symptoms.  She is not currently using a nasal saline rinse.  Her last environmental allergy skin testing was on 10/28/2017 and was positive to grass pollen and weed pollen.  Reflux is reported as well controlled with no symptoms including heartburn or vomiting.  She continues omeprazole daily 30 minutes before breakfast with relief of symptoms.  Her current medications are listed in the chart.  Drug Allergies:  Allergies  Allergen Reactions   Sulfa Antibiotics Hives   Tequin Swelling    Swelling of tongue   Codeine Itching   Vicodin [Hydrocodone-Acetaminophen] Other (See Comments)    Hallucinations     Physical Exam: BP 106/70   Pulse 88   Temp 98.1 F (36.7 C)   Resp 16   Ht '5\' 1"'$  (1.549 m)   Wt 163 lb 6 oz (74.1  kg)   LMP 05/26/2014   SpO2 99%   BMI 30.87 kg/m    Physical Exam Vitals reviewed.  Constitutional:      Appearance: Normal appearance.  HENT:     Head: Normocephalic and atraumatic.     Right Ear: Tympanic membrane normal.     Left Ear: Tympanic membrane normal.     Nose:     Comments: Bilateral nares slightly erythematous with clear nasal drainage noted.  Pharynx normal.  Ears normal.  Eyes normal.    Mouth/Throat:     Pharynx: Oropharynx is clear.  Eyes:     Conjunctiva/sclera: Conjunctivae normal.  Cardiovascular:     Rate and Rhythm: Normal rate and regular rhythm.     Heart sounds: Normal heart sounds. No murmur heard. Pulmonary:     Effort: Pulmonary effort is normal.     Breath sounds: Normal breath sounds.     Comments: Lungs clear to auscultation Musculoskeletal:        General: Normal range of motion.     Cervical back: Normal range of motion and neck supple.  Skin:    General: Skin is warm and dry.  Neurological:     Mental Status: She is alert and oriented to person, place, and time.  Psychiatric:        Mood and Affect: Mood normal.        Behavior: Behavior normal.  Thought Content: Thought content normal.        Judgment: Judgment normal.     Diagnostics: FVC 3.12, FEV1 2.54.  Predicted FVC 2.85, predicted FEV1 2.26.  Spirometry indicates normal ventilatory function.  Assessment and Plan: 1. Asthma, moderate persistent, well-controlled   2. Seasonal allergic rhinitis due to pollen   3. LPRD (laryngopharyngeal reflux disease)     Meds ordered this encounter  Medications   montelukast (SINGULAIR) 10 MG tablet    Sig: Take 1 tablet (10 mg total) by mouth at bedtime.    Dispense:  90 tablet    Refill:  11    Please hold patient will call when needed   SYMBICORT 160-4.5 MCG/ACT inhaler    Sig: Use 2 puffs 1-2 times daily as directed.    Dispense:  10.2 g    Refill:  11    Please hold patient will call when needed   albuterol (VENTOLIN  HFA) 108 (90 Base) MCG/ACT inhaler    Sig: Inhale 2 puffs into the lungs every 6 (six) hours as needed for wheezing or shortness of breath.    Dispense:  8 g    Refill:  2    Please hold patient will call when needed    Patient Instructions   1.  Continue to perform Allergen avoidance measures - grass / weed  2.  Continue to Treat and prevent inflammation:   A.  OTC Nasocort - 1 spray each nostril 1-2 times a day ([place])  B.  Montelukast 10 mg - 1 tablet 1 time per day   C.  Symbicort 160 -2 inhalations 1-2 times per day with spacer ([place])  3.  Continue to treat reflux:   A.  omeprazole 40 mg tablet 1 time per day  4. If needed:   A.  Nasal saline multiple times a day  B.  OTC cetirizine 10-20 mg 1 time per day  C.  Pro Air HFA or similar 2 inhalations every 4-6 hours  D.  OTC Mucinex DM 2 tablets twice a day  5. Obtain fall flu vaccine    6.  Return to clinic in 12 months or earlier if problem   Return in about 1 year (around 05/08/2023), or if symptoms worsen or fail to improve.    Thank you for the opportunity to care for this patient.  Please do not hesitate to contact me with questions.  Gareth Morgan, FNP [facility]

## 2022-05-07 NOTE — Patient Instructions (Addendum)
  1.  Continue to perform Allergen avoidance measures - grass / weed  2.  Continue to Treat and prevent inflammation:   A.  OTC Nasocort - 1 spray each nostril 1-2 times a day ([place])  B.  Montelukast 10 mg - 1 tablet 1 time per day   C.  Symbicort 160 -2 inhalations 1-2 times per day with spacer ([place])  3.  Continue to treat reflux:   A.  omeprazole 40 mg tablet 1 time per day  4. If needed:   A.  Nasal saline multiple times a day  B.  OTC cetirizine 10-20 mg 1 time per day  C.  Pro Air HFA or similar 2 inhalations every 4-6 hours  D.  OTC Mucinex DM 2 tablets twice a day  5. Obtain fall flu vaccine    6.  Return to clinic in 12 months or earlier if problem

## 2022-05-08 NOTE — Addendum Note (Signed)
Addended by: Valentina Shaggy on: 05/08/2022 01:39 PM   Modules accepted: Orders

## 2022-05-14 ENCOUNTER — Other Ambulatory Visit: Payer: Self-pay

## 2022-05-14 MED ORDER — VENTOLIN HFA 108 (90 BASE) MCG/ACT IN AERS: 2.0000 | INHALATION_SPRAY | RESPIRATORY_TRACT | 5 refills | Status: DC | PRN

## 2022-06-05 DIAGNOSIS — E7800 Pure hypercholesterolemia, unspecified: Secondary | ICD-10-CM | POA: Diagnosis not present

## 2022-06-05 DIAGNOSIS — R0602 Shortness of breath: Secondary | ICD-10-CM | POA: Diagnosis not present

## 2022-06-05 DIAGNOSIS — R5383 Other fatigue: Secondary | ICD-10-CM | POA: Diagnosis not present

## 2022-06-05 DIAGNOSIS — E559 Vitamin D deficiency, unspecified: Secondary | ICD-10-CM | POA: Diagnosis not present

## 2022-06-05 DIAGNOSIS — E8881 Metabolic syndrome: Secondary | ICD-10-CM | POA: Diagnosis not present

## 2022-06-05 DIAGNOSIS — D539 Nutritional anemia, unspecified: Secondary | ICD-10-CM | POA: Diagnosis not present

## 2022-06-05 DIAGNOSIS — Z79899 Other long term (current) drug therapy: Secondary | ICD-10-CM | POA: Diagnosis not present

## 2022-06-05 DIAGNOSIS — E88819 Insulin resistance, unspecified: Secondary | ICD-10-CM | POA: Diagnosis not present

## 2022-06-05 DIAGNOSIS — Z131 Encounter for screening for diabetes mellitus: Secondary | ICD-10-CM | POA: Diagnosis not present

## 2022-06-05 DIAGNOSIS — E669 Obesity, unspecified: Secondary | ICD-10-CM | POA: Diagnosis not present

## 2022-06-05 DIAGNOSIS — Z1331 Encounter for screening for depression: Secondary | ICD-10-CM | POA: Diagnosis not present

## 2022-06-11 ENCOUNTER — Encounter (INDEPENDENT_AMBULATORY_CARE_PROVIDER_SITE_OTHER): Payer: BC Managed Care – PPO | Admitting: Family Medicine

## 2022-06-11 DIAGNOSIS — T753XXA Motion sickness, initial encounter: Secondary | ICD-10-CM | POA: Diagnosis not present

## 2022-06-11 DIAGNOSIS — Z87898 Personal history of other specified conditions: Secondary | ICD-10-CM

## 2022-06-11 MED ORDER — SCOPOLAMINE 1 MG/3DAYS TD PT72: 1.0000 | MEDICATED_PATCH | TRANSDERMAL | 0 refills | Status: DC

## 2022-06-11 NOTE — Telephone Encounter (Signed)

## 2022-06-24 ENCOUNTER — Other Ambulatory Visit: Payer: Self-pay | Admitting: Family Medicine

## 2022-06-24 ENCOUNTER — Encounter: Payer: Self-pay | Admitting: Family Medicine

## 2022-06-24 DIAGNOSIS — R7989 Other specified abnormal findings of blood chemistry: Secondary | ICD-10-CM

## 2022-06-25 ENCOUNTER — Other Ambulatory Visit: Payer: BC Managed Care – PPO

## 2022-06-25 DIAGNOSIS — R7989 Other specified abnormal findings of blood chemistry: Secondary | ICD-10-CM | POA: Diagnosis not present

## 2022-06-25 DIAGNOSIS — R6889 Other general symptoms and signs: Secondary | ICD-10-CM | POA: Diagnosis not present

## 2022-06-26 LAB — TSH: TSH: 0.556 u[IU]/mL (ref 0.450–4.500)

## 2022-06-26 LAB — T4, FREE: Free T4: 1.36 ng/dL (ref 0.82–1.77)

## 2022-07-12 DIAGNOSIS — E663 Overweight: Secondary | ICD-10-CM | POA: Diagnosis not present

## 2022-07-12 DIAGNOSIS — E559 Vitamin D deficiency, unspecified: Secondary | ICD-10-CM | POA: Diagnosis not present

## 2022-07-12 DIAGNOSIS — R635 Abnormal weight gain: Secondary | ICD-10-CM | POA: Diagnosis not present

## 2022-07-15 ENCOUNTER — Encounter (INDEPENDENT_AMBULATORY_CARE_PROVIDER_SITE_OTHER): Payer: BC Managed Care – PPO | Admitting: Family Medicine

## 2022-07-15 DIAGNOSIS — R3989 Other symptoms and signs involving the genitourinary system: Secondary | ICD-10-CM

## 2022-07-15 MED ORDER — PHENAZOPYRIDINE HCL 200 MG PO TABS: 200.0000 mg | ORAL_TABLET | Freq: Three times a day (TID) | ORAL | 0 refills | Status: AC | PRN

## 2022-07-15 MED ORDER — CEPHALEXIN 500 MG PO CAPS: 500.0000 mg | ORAL_CAPSULE | Freq: Four times a day (QID) | ORAL | 0 refills | Status: AC

## 2022-07-15 NOTE — Telephone Encounter (Signed)

## 2022-08-13 DIAGNOSIS — E559 Vitamin D deficiency, unspecified: Secondary | ICD-10-CM | POA: Diagnosis not present

## 2022-08-13 DIAGNOSIS — R635 Abnormal weight gain: Secondary | ICD-10-CM | POA: Diagnosis not present

## 2022-08-13 DIAGNOSIS — E663 Overweight: Secondary | ICD-10-CM | POA: Diagnosis not present

## 2022-09-16 DIAGNOSIS — R635 Abnormal weight gain: Secondary | ICD-10-CM | POA: Diagnosis not present

## 2022-09-16 DIAGNOSIS — Z780 Asymptomatic menopausal state: Secondary | ICD-10-CM | POA: Diagnosis not present

## 2022-09-16 DIAGNOSIS — E559 Vitamin D deficiency, unspecified: Secondary | ICD-10-CM | POA: Diagnosis not present

## 2022-09-16 DIAGNOSIS — E663 Overweight: Secondary | ICD-10-CM | POA: Diagnosis not present

## 2022-09-18 ENCOUNTER — Other Ambulatory Visit: Payer: BC Managed Care – PPO

## 2022-09-18 ENCOUNTER — Encounter: Payer: Self-pay | Admitting: Family Medicine

## 2022-09-18 DIAGNOSIS — R3989 Other symptoms and signs involving the genitourinary system: Secondary | ICD-10-CM

## 2022-09-18 LAB — MICROSCOPIC EXAMINATION
Epithelial Cells (non renal): NONE SEEN /hpf (ref 0–10)
Renal Epithel, UA: NONE SEEN /hpf

## 2022-09-18 LAB — URINALYSIS, ROUTINE W REFLEX MICROSCOPIC
Bilirubin, UA: NEGATIVE
Glucose, UA: NEGATIVE
Ketones, UA: NEGATIVE
Nitrite, UA: NEGATIVE
Specific Gravity, UA: 1.01 (ref 1.005–1.030)
Urobilinogen, Ur: 0.2 mg/dL (ref 0.2–1.0)
pH, UA: 7.5 (ref 5.0–7.5)

## 2022-09-22 LAB — URINE CULTURE

## 2022-09-23 ENCOUNTER — Encounter: Payer: Self-pay | Admitting: Family Medicine

## 2022-09-23 DIAGNOSIS — N3000 Acute cystitis without hematuria: Secondary | ICD-10-CM

## 2022-09-23 MED ORDER — PHENAZOPYRIDINE HCL 200 MG PO TABS: 200.0000 mg | ORAL_TABLET | Freq: Three times a day (TID) | ORAL | 0 refills | Status: AC | PRN

## 2022-09-23 MED ORDER — NITROFURANTOIN MONOHYD MACRO 100 MG PO CAPS: 100.0000 mg | ORAL_CAPSULE | Freq: Two times a day (BID) | ORAL | 0 refills | Status: AC

## 2022-10-16 DIAGNOSIS — R635 Abnormal weight gain: Secondary | ICD-10-CM | POA: Diagnosis not present

## 2022-10-16 DIAGNOSIS — E559 Vitamin D deficiency, unspecified: Secondary | ICD-10-CM | POA: Diagnosis not present

## 2022-10-28 ENCOUNTER — Encounter: Payer: Self-pay | Admitting: Nurse Practitioner

## 2022-10-28 ENCOUNTER — Ambulatory Visit (INDEPENDENT_AMBULATORY_CARE_PROVIDER_SITE_OTHER): Payer: BC Managed Care – PPO | Admitting: Nurse Practitioner

## 2022-10-28 VITALS — BP 124/82 | HR 81 | Ht 62.0 in | Wt 146.0 lb

## 2022-10-28 DIAGNOSIS — Z780 Asymptomatic menopausal state: Secondary | ICD-10-CM

## 2022-10-28 DIAGNOSIS — Z01419 Encounter for gynecological examination (general) (routine) without abnormal findings: Secondary | ICD-10-CM | POA: Diagnosis not present

## 2022-10-28 DIAGNOSIS — Z1231 Encounter for screening mammogram for malignant neoplasm of breast: Secondary | ICD-10-CM | POA: Diagnosis not present

## 2022-10-28 NOTE — Progress Notes (Signed)
   Carol Chambers 1961-08-06 [id number]   History:  62 y.o. G1P0010 presents for annual exam without GYN complaints. Postmenopausal - no HRT, no bleeding. Normal pap and mammogram history. History of hypothyroidism and migraines.   Gynecologic History Patient's last menstrual period was 05/26/2014.   Contraception/Family planning: post menopausal status Sexually active: Yes  Health Maintenance Last Pap: 10/19/2019. Results were: Normal neg HPV, 5-year repeat Last mammogram: 10/22/2021. Results were: Normal Last colonoscopy: 10/22/2012. Results were: Normal, 10-year recall Last Dexa: Never  Past medical history, past surgical history, family history and social history were all reviewed and documented in the EPIC chart. Boyfriend. Health and Education officer, environmental for [employer]. Mother with history of uterine cancer at age 94, MGM with history of breast cancer in her 59s.   ROS:  A ROS was performed and pertinent positives and negatives are included.  Exam:  Vitals:   10/28/22 0929  BP: 124/82  Pulse: 81  SpO2: 99%  Weight: 146 lb (66.2 kg)  Height: 5\' 2"  (1.575 m)     Body mass index is 26.7 kg/m.  General appearance:  Normal Thyroid:  Symmetrical, normal in size, without palpable masses or nodularity. Respiratory  Auscultation:  Clear without wheezing or rhonchi Cardiovascular  Auscultation:  Regular rate, without rubs, murmurs or gallops  Edema/varicosities:  Not grossly evident Abdominal  Soft,nontender, without masses, guarding or rebound.  Liver/spleen:  No organomegaly noted  Hernia:  None appreciated  Skin  Inspection:  Grossly normal   Breasts: Examined lying and sitting.   Right: Without masses, retractions, discharge or axillary adenopathy.   Left: Without masses, retractions, discharge or axillary adenopathy. Genitourinary   Inguinal/mons:  Normal without inguinal adenopathy  External genitalia:  Normal appearing vulva with no masses, tenderness, or  lesions  BUS/Urethra/Skene's glands:  Normal  Vagina:  Normal appearing with normal color and discharge, no lesions  Cervix:  Normal appearing without discharge or lesions  Uterus:  Normal in size, shape and contour.  Midline and mobile, nontender  Adnexa/parametria:     Rt: Normal in size, without masses or tenderness.   Lt: Normal in size, without masses or tenderness.  Anus and perineum: Normal  Digital rectal exam: Deferred  Assessment/Plan:  62 y.o. G1P0010 for annual exam.   Well female exam with routine gynecological exam - Education provided on SBEs, importance of preventative screenings, current guidelines, high calcium diet, regular exercise, and multivitamin daily. Labs with PCP and through work.   Postmenopausal - no HRT, no bleeding  Screening for cervical cancer - Normal Pap history.  Will repeat at 5-year interval per guidelines.  Screening for breast cancer - Normal mammogram history.  Continue annual screenings.  Normal breast exam today. Scheduled today.   Screening for colon cancer - 2014 colonoscopy. Will repeat at GI's recommended interval.   Screening for osteoporosis - Average risk. DXA scheduled today.   Return in 1 year for annual.      Tamela Gammon [facility], 9:42 AM 10/28/2022

## 2022-10-29 ENCOUNTER — Encounter: Payer: Self-pay | Admitting: Gastroenterology

## 2022-10-29 ENCOUNTER — Encounter: Payer: Self-pay | Admitting: Nurse Practitioner

## 2022-11-01 LAB — HM DEXA SCAN

## 2022-11-13 ENCOUNTER — Encounter: Payer: Self-pay | Admitting: Gastroenterology

## 2022-11-13 ENCOUNTER — Ambulatory Visit (AMBULATORY_SURGERY_CENTER): Payer: BC Managed Care – PPO | Admitting: *Deleted

## 2022-11-13 VITALS — Ht 62.0 in | Wt 143.0 lb

## 2022-11-13 DIAGNOSIS — Z1211 Encounter for screening for malignant neoplasm of colon: Secondary | ICD-10-CM

## 2022-11-13 MED ORDER — NA SULFATE-K SULFATE-MG SULF 17.5-3.13-1.6 GM/177ML PO SOLN: 1.0000 | Freq: Once | ORAL | 0 refills | Status: AC

## 2022-11-13 NOTE — Progress Notes (Signed)

## 2022-11-15 DIAGNOSIS — E559 Vitamin D deficiency, unspecified: Secondary | ICD-10-CM | POA: Diagnosis not present

## 2022-11-15 DIAGNOSIS — E663 Overweight: Secondary | ICD-10-CM | POA: Diagnosis not present

## 2022-11-15 DIAGNOSIS — M818 Other osteoporosis without current pathological fracture: Secondary | ICD-10-CM | POA: Diagnosis not present

## 2022-11-15 DIAGNOSIS — R635 Abnormal weight gain: Secondary | ICD-10-CM | POA: Diagnosis not present

## 2022-12-02 ENCOUNTER — Other Ambulatory Visit: Payer: Self-pay | Admitting: Family Medicine

## 2022-12-02 DIAGNOSIS — E034 Atrophy of thyroid (acquired): Secondary | ICD-10-CM

## 2022-12-10 ENCOUNTER — Ambulatory Visit (AMBULATORY_SURGERY_CENTER): Payer: BC Managed Care – PPO | Admitting: Gastroenterology

## 2022-12-10 ENCOUNTER — Encounter: Payer: Self-pay | Admitting: Gastroenterology

## 2022-12-10 VITALS — BP 103/68 | HR 59 | Temp 98.6°F | Resp 18 | Ht 62.0 in | Wt 143.0 lb

## 2022-12-10 DIAGNOSIS — Z1211 Encounter for screening for malignant neoplasm of colon: Secondary | ICD-10-CM

## 2022-12-10 MED ORDER — SODIUM CHLORIDE 0.9 % IV SOLN: 500.0000 mL | Freq: Once | INTRAVENOUS | Status: DC

## 2022-12-10 NOTE — Progress Notes (Signed)
History and Physical:  This patient presents for endoscopic testing for: Encounter Diagnosis  Name Primary?   Special screening for malignant neoplasms, colon Yes    Average risk for colorectal cancer.   No polyps last colonoscopy in 2014 Patient denies chronic abdominal pain, rectal bleeding, constipation or diarrhea.    Patient is otherwise without complaints or active issues today.   Past Medical History: Past Medical History:  Diagnosis Date   Acid reflux    Allergy    SEASONAL   Anemia    ASCUS of cervix with negative high risk HPV 10/2018   Asthma    History of kidney stones    Hypercholesteremia    Hypothyroidism    PONV (postoperative nausea and vomiting)    with Cholecystectomy   Seasonal allergies      Past Surgical History: Past Surgical History:  Procedure Laterality Date   BRAVO PH STUDY N/A 08/23/2014   Procedure: BRAVO PH STUDY;  Surgeon: Louis Meckel, MD;  Location: [facility];  Service: Endoscopy;  Laterality: N/A;  48 hour bravo pH study with impedance plethysmography    BRAVO PH STUDY N/A 10/10/2014   Procedure: BRAVO PH STUDY;  Surgeon: Louis Meckel, MD;  Location: [facility];  Service: Endoscopy;  Laterality: N/A;  2nd attempt   CARPAL TUNNEL RELEASE  2003   bilat   COLONOSCOPY     ESOPHAGOGASTRODUODENOSCOPY (EGD) WITH PROPOFOL N/A 08/23/2014   Procedure: ESOPHAGOGASTRODUODENOSCOPY (EGD) WITH PROPOFOL;  Surgeon: Louis Meckel, MD;  Location: [facility];  Service: Endoscopy;  Laterality: N/A;   ESOPHAGOGASTRODUODENOSCOPY (EGD) WITH PROPOFOL N/A 10/10/2014   Procedure: ESOPHAGOGASTRODUODENOSCOPY (EGD) WITH PROPOFOL;  Surgeon: Louis Meckel, MD;  Location: [facility];  Service: Endoscopy;  Laterality: N/A;   Esophagus stretched     X 2   FOOT SURGERY     03-24-14 Plantar Fasciatitis   FOOT SURGERY Left    foot surgery 09-29-14   groin cyst removed  2000   right   LAPAROSCOPIC CHOLECYSTECTOMY  2003    Allergies: Allergies   Allergen Reactions   Sulfa Antibiotics Hives   Tequin Swelling    Swelling of tongue   Codeine Itching   Vicodin [Hydrocodone-Acetaminophen] Other (See Comments)    Hallucinations     Outpatient Meds: Current Outpatient Medications  Medication Sig Dispense Refill   Cholecalciferol (VITAMIN D-3 PO) Take by mouth. 50,000 UNITS WEEKLY     cycloSPORINE (RESTASIS) 0.05 % ophthalmic emulsion Restasis 0.05 % eye drops in a dropperette     levothyroxine (SYNTHROID) 100 MCG tablet Take 1 tablet by mouth daily. 90 tablet 1   Magnesium 250 MG TABS Take 2 tablets by mouth daily.     montelukast (SINGULAIR) 10 MG tablet Take 1 tablet (10 mg total) by mouth at bedtime. 90 tablet 11   omeprazole (PRILOSEC) 40 MG capsule Take 1 capsule (40 mg total) by mouth daily. 90 capsule 3   simvastatin (ZOCOR) 20 MG tablet Take 1 tablet (20 mg total) by mouth at bedtime. 90 tablet 3   Triamcinolone Acetonide (NASACORT AQ NA) Place 1 spray into the nose 2 (two) times daily. Uses as needed.     Vitamin D, Ergocalciferol, (DRISDOL) 1.25 MG (50000 UNIT) CAPS capsule Take 50,000 Units by mouth once a week.     cyclobenzaprine (FLEXERIL) 10 MG tablet TAKE 1 TABLET THREE TIMES A DAY AS NEEDED FOR MUSCLE SPASMS 180 tablet 0   phentermine (ADIPEX-P) 37.5 MG tablet Take 37.5 mg by mouth daily.  SYMBICORT 160-4.5 MCG/ACT inhaler Use 2 puffs 1-2 times daily as directed. (Patient not taking: Reported on 11/13/2022) 10.2 g 11   topiramate (TOPAMAX) 50 MG tablet Take 1 tablet (50 mg total) by mouth 2 (two) times daily. 180 tablet 3   VENTOLIN HFA 108 (90 Base) MCG/ACT inhaler Inhale 2 puffs into the lungs every 4 (four) hours as needed for wheezing or shortness of breath. 1 each 5   Current Facility-Administered Medications  Medication Dose Route Frequency Provider Last Rate Last Admin   0.9 %  sodium chloride infusion  500 mL Intravenous Once Danis, Starr Lake III, MD           ___________________________________________________________________ Objective   Exam:  BP 110/68   Pulse 71   Temp 98.6 F (37 C) (Temporal)   Ht 5\' 2"  (1.575 m)   Wt 143 lb (64.9 kg)   LMP 05/26/2014   SpO2 100%   BMI 26.16 kg/m   CV: regular , S1/S2 Resp: clear to auscultation bilaterally, normal RR and effort noted GI: soft, no tenderness, with active bowel sounds.   Assessment: Encounter Diagnosis  Name Primary?   Special screening for malignant neoplasms, colon Yes     Plan: Colonoscopy  The benefits and risks of the planned procedure were described in detail with the patient or (when appropriate) their health care proxy.  Risks were outlined as including, but not limited to, bleeding, infection, perforation, adverse medication reaction leading to cardiac or pulmonary decompensation, pancreatitis (if ERCP).  The limitation of incomplete mucosal visualization was also discussed.  No guarantees or warranties were given.    The patient is appropriate for an endoscopic procedure in the ambulatory setting.   - Amada Jupiter, MD

## 2022-12-10 NOTE — Progress Notes (Signed)
VS completed by DT.  Pt's states no medical or surgical changes since previsit or office visit.  

## 2022-12-10 NOTE — Patient Instructions (Addendum)
Resume previous diet. Continue present medications. Repeat colonoscopy in 10 years for screening purposes.   YOU HAD AN ENDOSCOPIC PROCEDURE TODAY AT THE [facility]:   Refer to the procedure report that was given to you for any specific questions about what was found during the examination.  If the procedure report does not answer your questions, please call your gastroenterologist to clarify.  If you requested that your care partner not be given the details of your procedure findings, then the procedure report has been included in a sealed envelope for you to review at your convenience later.  YOU SHOULD EXPECT: Some feelings of bloating in the abdomen. Passage of more gas than usual.  Walking can help get rid of the air that was put into your GI tract during the procedure and reduce the bloating. If you had a lower endoscopy (such as a colonoscopy or flexible sigmoidoscopy) you may notice spotting of blood in your stool or on the toilet paper. If you underwent a bowel prep for your procedure, you may not have a normal bowel movement for a few days.  Please Note:  You might notice some irritation and congestion in your nose or some drainage.  This is from the oxygen used during your procedure.  There is no need for concern and it should clear up in a day or so.  SYMPTOMS TO REPORT IMMEDIATELY:  Following lower endoscopy (colonoscopy or flexible sigmoidoscopy):  Excessive amounts of blood in the stool  Significant tenderness or worsening of abdominal pains  Swelling of the abdomen that is new, acute  Fever of 100F or higher  For urgent or emergent issues, a gastroenterologist can be reached at any hour by calling [phone]. Do not use MyChart messaging for urgent concerns.    DIET:  We do recommend a small meal at first, but then you may proceed to your regular diet.  Drink plenty of fluids but you should avoid alcoholic beverages for 24 hours.  ACTIVITY:  You should plan  to take it easy for the rest of today and you should NOT DRIVE or use heavy machinery until tomorrow (because of the sedation medicines used during the test).    FOLLOW UP: Our staff will call the number listed on your records the next business day following your procedure.  We will call around 7:15- 8:00 am to check on you and address any questions or concerns that you may have regarding the information given to you following your procedure. If we do not reach you, we will leave a message.     If any biopsies were taken you will be contacted by phone or by letter within the next 1-3 weeks.  Please call us at [phone] if you have not heard about the biopsies in 3 weeks.    SIGNATURES/CONFIDENTIALITY: You and/or your care partner have signed paperwork which will be entered into your electronic medical record.  These signatures attest to the fact that that the information above on your After Visit Summary has been reviewed and is understood.  Full responsibility of the confidentiality of this discharge information lies with you and/or your care-partner. 

## 2022-12-10 NOTE — Op Note (Signed)
[facility] Patient Name: Carol Chambers Procedure Date: 12/10/2022 9:41 AM MRN: [id number] Endoscopist: Sherilyn Cooter L. Myrtie Neither , MD, [id number] Age: 62 Referring MD:  Date of Birth: [birth year] Gender: Female Account #: [id number] Procedure:                Colonoscopy Indications:              Screening for colorectal malignant neoplasm                           No polyps on March 2014 colonoscopy Medicines:                Monitored Anesthesia Care Procedure:                Pre-Anesthesia Assessment:                           - Prior to the procedure, a History and Physical                            was performed, and patient medications and                            allergies were reviewed. The patient's tolerance of                            previous anesthesia was also reviewed. The risks                            and benefits of the procedure and the sedation                            options and risks were discussed with the patient.                            All questions were answered, and informed consent                            was obtained. Prior Anticoagulants: The patient has                            taken no anticoagulant or antiplatelet agents. ASA                            Grade Assessment: II - A patient with mild systemic                            disease. After reviewing the risks and benefits,                            the patient was deemed in satisfactory condition to                            undergo the procedure.  After obtaining informed consent, the colonoscope                            was passed under direct vision. Throughout the                            procedure, the patient's blood pressure, pulse, and                            oxygen saturations were monitored continuously. The                            Olympus SN [id number] was introduced through the anus                            and advanced to the the cecum,  identified by                            appendiceal orifice and ileocecal valve. The                            colonoscopy was performed with difficulty due to                            multiple diverticula in the colon and a redundant                            colon. Successful completion of the procedure was                            aided by using manual pressure and straightening                            and shortening the scope to obtain bowel loop                            reduction. The patient tolerated the procedure                            well. The quality of the bowel preparation was                            good. The ileocecal valve, appendiceal orifice, and                            rectum were photographed. Scope In: 9:55:54 AM Scope Out: 10:11:26 AM Scope Withdrawal Time: 0 hours 9 minutes 22 seconds  Total Procedure Duration: 0 hours 15 minutes 32 seconds  Findings:                 The perianal and digital rectal examinations were                            normal.  Diverticula were found in the left colon.                           Repeat examination of right colon under NBI                            performed.                           There is no endoscopic evidence of polyps in the                            entire colon.                           The exam was otherwise without abnormality on                            direct and retroflexion views. Complications:            No immediate complications. Estimated Blood Loss:     Estimated blood loss: none. Impression:               - Diverticulosis in the left colon.                           - The examination was otherwise normal on direct                            and retroflexion views.                           - No specimens collected. Recommendation:           - Patient has a contact number available for                            emergencies. The signs and symptoms of  potential                            delayed complications were discussed with the                            patient. Return to normal activities tomorrow.                            Written discharge instructions were provided to the                            patient.                           - Resume previous diet.                           - Continue present medications.                           -  Repeat colonoscopy in 10 years for screening                            purposes. Severus Brodzinski L. Myrtie Neither, MD 12/10/2022 10:14:42 AM This report has been signed electronically.

## 2022-12-10 NOTE — Progress Notes (Signed)
Sedate, gd SR, tolerated procedure well, VSS, report to RN 

## 2022-12-11 ENCOUNTER — Telehealth: Payer: Self-pay

## 2022-12-11 NOTE — Telephone Encounter (Signed)
Follow up call placed, VM obtained and message left. 

## 2022-12-14 DIAGNOSIS — H04123 Dry eye syndrome of bilateral lacrimal glands: Secondary | ICD-10-CM | POA: Diagnosis not present

## 2022-12-14 DIAGNOSIS — H40033 Anatomical narrow angle, bilateral: Secondary | ICD-10-CM | POA: Diagnosis not present

## 2022-12-14 DIAGNOSIS — H5213 Myopia, bilateral: Secondary | ICD-10-CM | POA: Diagnosis not present

## 2022-12-16 DIAGNOSIS — E559 Vitamin D deficiency, unspecified: Secondary | ICD-10-CM | POA: Diagnosis not present

## 2022-12-16 DIAGNOSIS — M818 Other osteoporosis without current pathological fracture: Secondary | ICD-10-CM | POA: Diagnosis not present

## 2022-12-16 DIAGNOSIS — Z131 Encounter for screening for diabetes mellitus: Secondary | ICD-10-CM | POA: Diagnosis not present

## 2022-12-16 DIAGNOSIS — D539 Nutritional anemia, unspecified: Secondary | ICD-10-CM | POA: Diagnosis not present

## 2022-12-16 DIAGNOSIS — E7800 Pure hypercholesterolemia, unspecified: Secondary | ICD-10-CM | POA: Diagnosis not present

## 2022-12-16 DIAGNOSIS — R5383 Other fatigue: Secondary | ICD-10-CM | POA: Diagnosis not present

## 2022-12-16 DIAGNOSIS — Z79899 Other long term (current) drug therapy: Secondary | ICD-10-CM | POA: Diagnosis not present

## 2022-12-16 DIAGNOSIS — E663 Overweight: Secondary | ICD-10-CM | POA: Diagnosis not present

## 2022-12-16 DIAGNOSIS — R635 Abnormal weight gain: Secondary | ICD-10-CM | POA: Diagnosis not present

## 2022-12-16 LAB — LAB REPORT - SCANNED
A1c: 5.3
EGFR: 65

## 2022-12-30 NOTE — Telephone Encounter (Signed)
Erroneous encounter will close.

## 2023-01-17 DIAGNOSIS — M818 Other osteoporosis without current pathological fracture: Secondary | ICD-10-CM | POA: Diagnosis not present

## 2023-01-17 DIAGNOSIS — E7800 Pure hypercholesterolemia, unspecified: Secondary | ICD-10-CM | POA: Diagnosis not present

## 2023-01-17 DIAGNOSIS — E663 Overweight: Secondary | ICD-10-CM | POA: Diagnosis not present

## 2023-01-17 DIAGNOSIS — J4540 Moderate persistent asthma, uncomplicated: Secondary | ICD-10-CM | POA: Diagnosis not present

## 2023-01-17 DIAGNOSIS — R635 Abnormal weight gain: Secondary | ICD-10-CM | POA: Diagnosis not present

## 2023-01-19 ENCOUNTER — Other Ambulatory Visit: Payer: Self-pay | Admitting: Family Medicine

## 2023-01-19 DIAGNOSIS — E782 Mixed hyperlipidemia: Secondary | ICD-10-CM

## 2023-01-19 DIAGNOSIS — K219 Gastro-esophageal reflux disease without esophagitis: Secondary | ICD-10-CM

## 2023-01-20 ENCOUNTER — Encounter: Payer: Self-pay | Admitting: Family Medicine

## 2023-01-20 ENCOUNTER — Telehealth: Payer: Self-pay | Admitting: Family Medicine

## 2023-01-20 ENCOUNTER — Other Ambulatory Visit: Payer: Self-pay | Admitting: Family Medicine

## 2023-01-20 DIAGNOSIS — G43009 Migraine without aura, not intractable, without status migrainosus: Secondary | ICD-10-CM

## 2023-01-20 DIAGNOSIS — K219 Gastro-esophageal reflux disease without esophagitis: Secondary | ICD-10-CM

## 2023-01-20 DIAGNOSIS — E782 Mixed hyperlipidemia: Secondary | ICD-10-CM

## 2023-01-20 MED ORDER — SIMVASTATIN 20 MG PO TABS: 20.0000 mg | ORAL_TABLET | Freq: Every day | ORAL | 0 refills | Status: DC

## 2023-01-20 MED ORDER — OMEPRAZOLE 40 MG PO CPDR: 40.0000 mg | DELAYED_RELEASE_CAPSULE | Freq: Every day | ORAL | 0 refills | Status: DC

## 2023-01-20 MED ORDER — TOPIRAMATE 50 MG PO TABS
50.0000 mg | ORAL_TABLET | Freq: Two times a day (BID) | ORAL | 0 refills | Status: DC
Start: 2023-01-20 — End: 2023-02-19

## 2023-01-20 NOTE — Telephone Encounter (Signed)
Letter sent.

## 2023-01-20 NOTE — Telephone Encounter (Signed)
  Prescription Request  01/20/2023  Is this a "Controlled Substance" medicine?   Have you seen your PCP in the last 2 weeks? Appt made for 02/19/23  If YES, route message to pool  -  If NO, patient needs to be scheduled for appointment.  What is the name of the medication or equipment? omeprazole (PRILOSEC) 40 MG capsule  simvastatin (ZOCOR) 20 MG tablet  levothyroxine (SYNTHROID) 100 MCG tablet  Have you contacted your pharmacy to request a refill? yes   Which pharmacy would you like this sent to? [facility]    Patient notified that their request is being sent to the clinical staff for review and that they should receive a response within 2 business days.

## 2023-01-20 NOTE — Telephone Encounter (Signed)
Pt aware Simvastatin & Omeprazole done earlier today, Levothyroxine was done on 12/02/22 #90 w/ a refill.

## 2023-01-20 NOTE — Telephone Encounter (Signed)
[place] NTBS last OV 09/05/21 NO RF will be sent to pharmacy last OV greater than 1 year

## 2023-02-19 ENCOUNTER — Ambulatory Visit: Payer: BC Managed Care – PPO | Admitting: Family Medicine

## 2023-02-19 ENCOUNTER — Encounter: Payer: Self-pay | Admitting: Family Medicine

## 2023-02-19 VITALS — BP 105/69 | HR 73 | Temp 98.3°F | Ht 62.0 in | Wt 144.2 lb

## 2023-02-19 DIAGNOSIS — L821 Other seborrheic keratosis: Secondary | ICD-10-CM

## 2023-02-19 DIAGNOSIS — M545 Low back pain, unspecified: Secondary | ICD-10-CM | POA: Diagnosis not present

## 2023-02-19 DIAGNOSIS — G43009 Migraine without aura, not intractable, without status migrainosus: Secondary | ICD-10-CM

## 2023-02-19 DIAGNOSIS — K219 Gastro-esophageal reflux disease without esophagitis: Secondary | ICD-10-CM | POA: Diagnosis not present

## 2023-02-19 DIAGNOSIS — E782 Mixed hyperlipidemia: Secondary | ICD-10-CM

## 2023-02-19 DIAGNOSIS — Z8249 Family history of ischemic heart disease and other diseases of the circulatory system: Secondary | ICD-10-CM

## 2023-02-19 DIAGNOSIS — E034 Atrophy of thyroid (acquired): Secondary | ICD-10-CM | POA: Diagnosis not present

## 2023-02-19 DIAGNOSIS — G8929 Other chronic pain: Secondary | ICD-10-CM

## 2023-02-19 MED ORDER — SIMVASTATIN 20 MG PO TABS
20.0000 mg | ORAL_TABLET | Freq: Every day | ORAL | 3 refills | Status: DC
Start: 2023-02-19 — End: 2024-02-20

## 2023-02-19 MED ORDER — CYCLOBENZAPRINE HCL 10 MG PO TABS
ORAL_TABLET | ORAL | 0 refills | Status: DC
Start: 2023-02-19 — End: 2024-02-20

## 2023-02-19 MED ORDER — LEVOTHYROXINE SODIUM 100 MCG PO TABS
100.0000 ug | ORAL_TABLET | Freq: Every day | ORAL | 3 refills | Status: DC
Start: 2023-02-19 — End: 2024-01-30

## 2023-02-19 MED ORDER — MONTELUKAST SODIUM 10 MG PO TABS: 10.0000 mg | ORAL_TABLET | Freq: Every day | ORAL | 3 refills | Status: DC

## 2023-02-19 MED ORDER — TOPIRAMATE 50 MG PO TABS
50.0000 mg | ORAL_TABLET | Freq: Two times a day (BID) | ORAL | 3 refills | Status: DC
Start: 2023-02-19 — End: 2024-02-20

## 2023-02-19 MED ORDER — OMEPRAZOLE 40 MG PO CPDR
40.0000 mg | DELAYED_RELEASE_CAPSULE | Freq: Every day | ORAL | 3 refills | Status: DC
Start: 2023-02-19 — End: 2024-01-30

## 2023-02-19 NOTE — Patient Instructions (Addendum)
Seborrheic Keratosis A seborrheic keratosis is a common, noncancerous (benign) skin growth. These growths are velvety, waxy, or rough spots that appear on the skin. They are often tan, brown, or black. The skin growths can be flat or raised and may be scaly. What are the causes? The cause of this condition is not known. What increases the risk? You are more likely to develop this condition if you: Have a family history of seborrheic keratosis. Are 46 years old or older. Are pregnant. Have had estrogen replacement therapy. What are the signs or symptoms? Symptoms of this condition include growths on the face, chest, shoulders, back, or other areas. These growths: Are usually painless, but may become irritated and itchy. Can be tan, yellow, brown, black, or other colors. Are slightly raised or have a flat surface. Are sometimes rough or wart-like in texture. Are often velvety or waxy on the surface. Are round or oval-shaped. Often occur in groups, but may occur as a single growth. How is this diagnosed? This condition is diagnosed with a medical history and physical exam. A sample of the growth may be tested (skin biopsy). You may also need to see a skin specialist (dermatologist). How is this treated? Treatment is not usually needed for this condition unless the growths are irritated or bleed often. You may also choose to have the growths removed if you do not like their appearance. Growth removal may include a procedure in which: Liquid nitrogen is applied to "freeze" off the growth (cryosurgery). This is the most common procedure. The growth is burned off with electricity (electrocautery). The growth is removed by scraping (curettage). Follow these instructions at home: Watch your growth or growths for any changes. Do not scratch or pick at the growth or growths. This can cause them to become irritated or infected. Contact a health care provider if: You suddenly have many new  growths. Your growth bleeds, itches, or hurts. Your growth suddenly becomes larger or changes color. Summary A seborrheic keratosis is a common, noncancerous skin growth. Treatment is not usually needed for this condition unless the growths are irritated or bleed often. Watch your growth or growths for any changes. Contact a health care provider if you suddenly have many new growths or your growth suddenly becomes larger or changes color. This information is not intended to replace advice given to you by your health care provider. Make sure you discuss any questions you have with your health care provider. Document Revised: 10/12/2021 Document Reviewed: 10/12/2021 Elsevier Patient Education  2024 [employer] Coronary Calcium Scan A coronary calcium scan is an imaging test used to look for deposits of plaque in the inner lining of the blood vessels of the heart (coronary arteries). Plaque is made up of calcium, protein, and fatty substances. These deposits of plaque can partly clog and narrow the coronary arteries without producing any symptoms or warning signs. This puts a person at risk for a heart attack. A coronary calcium scan is performed using a computed tomography (CT) scanner machine without using a dye (contrast). This test is recommended for people who are at moderate risk for heart disease. The test can find plaque deposits before symptoms develop. Tell a health care provider about: Any allergies you have. All medicines you are taking, including vitamins, herbs, eye drops, creams, and over-the-counter medicines. Any problems you or family members have had with anesthetic medicines. Any bleeding problems you have. Any surgeries you have had. Any medical conditions you have. Whether you are pregnant  or may be pregnant. What are the risks? Generally, this is a safe procedure. However, problems may occur, including: Harm to a pregnant woman and her unborn baby. This test involves  the use of radiation. Radiation exposure can be dangerous to a pregnant woman and her unborn baby. If you are pregnant or think you may be pregnant, you should not have this procedure done. A slight increase in the risk of cancer. This is because of the radiation involved in the test. The amount of radiation from one test is similar to the amount of radiation you are naturally exposed to over one year. What happens before the procedure? Ask your health care provider for any specific instructions on how to prepare for this procedure. You may be asked to avoid products that contain caffeine, tobacco, or nicotine for 4 hours before the procedure. What happens during the procedure?  You will undress and remove any jewelry from your neck or chest. You may need to remove hearing aides and dentures. Women may need to remove their bras. You will put on a hospital gown. Sticky electrodes will be placed on your chest. The electrodes will be connected to an electrocardiogram (ECG) machine to record a tracing of the electrical activity of your heart. You will lie down on your back on a curved bed that is attached to the CT scanner. You may be given medicine to slow down your heart rate so that clear pictures can be created. You will be moved into the CT scanner, and the CT scanner will take pictures of your heart. During this time, you will be asked to lie still and hold your breath for 10-20 seconds at a time while each picture of your heart is being taken. The procedure may vary among health care providers and hospitals. What can I expect after the procedure? You can return to your normal activities. It is up to you to get the results of your procedure. Ask your health care provider, or the department that is doing the procedure, when your results will be ready. Summary A coronary calcium scan is an imaging test used to look for deposits of plaque in the inner lining of the blood vessels of the heart. Plaque  is made up of calcium, protein, and fatty substances. A coronary calcium scan is performed using a CT scanner machine without contrast. Generally, this is a safe procedure. Tell your health care provider if you are pregnant or may be pregnant. Ask your health care provider for any specific instructions on how to prepare for this procedure. You can return to your normal activities after the scan is done. This information is not intended to replace advice given to you by your health care provider. Make sure you discuss any questions you have with your health care provider. Document Revised: 07/08/2021 Document Reviewed: 07/08/2021 Elsevier Patient Education  2024 [employer].

## 2023-02-19 NOTE — Progress Notes (Signed)
Subjective: [id number] PCP: Raliegh Ip, DO Carol Chambers is a 62 y.o. female presenting to clinic today for:  1. Hypothyroidism Reports compliance with Synthroid 100 mcg daily.  She had labs drawn recently at [facility] and this showed normal thyroid levels.  She denies any changes in bowel habits.  She does occasionally feel somewhat shaky on the inside.  She denies any nystagmus, vertigo or other actual physical findings.    2.  Pure hypercholesterolemia LDL noted to be 109 but HDL is 89.  There is a family history of CAD in a grandparent.  Her mother suffers from hyperlipidemia.  She denies any chest pain, change in exercise tolerance.  She recently started an exercise regimen.  3.  Skin lesions Patient reports skin lesions on the left flank.  Denies any pain or bleeding or itching.  She was worried that it may be something of concern and wanted to make sure that we take a look at it today.  She wears sunscreen regularly  ROS: Per HPI  Allergies  Allergen Reactions   Sulfa Antibiotics Hives   Tequin Swelling    Swelling of tongue   Codeine Itching   Vicodin [Hydrocodone-Acetaminophen] Other (See Comments)    Hallucinations    Past Medical History:  Diagnosis Date   Acid reflux    Allergy    SEASONAL   Anemia    ASCUS of cervix with negative high risk HPV 10/2018   Asthma    History of kidney stones    Hypercholesteremia    Hypothyroidism    PONV (postoperative nausea and vomiting)    with Cholecystectomy   Seasonal allergies     Current Outpatient Medications:    Cholecalciferol (VITAMIN D-3 PO), Take by mouth. 50,000 UNITS WEEKLY, Disp: , Rfl:    cyclobenzaprine (FLEXERIL) 10 MG tablet, TAKE 1 TABLET THREE TIMES A DAY AS NEEDED FOR MUSCLE SPASMS, Disp: 180 tablet, Rfl: 0   cycloSPORINE (RESTASIS) 0.05 % ophthalmic emulsion, Restasis 0.05 % eye drops in a dropperette, Disp: , Rfl:    levothyroxine (SYNTHROID) 100 MCG tablet, Take 1 tablet by  mouth daily., Disp: 90 tablet, Rfl: 1   Magnesium 250 MG TABS, Take 2 tablets by mouth daily., Disp: , Rfl:    montelukast (SINGULAIR) 10 MG tablet, Take 1 tablet (10 mg total) by mouth at bedtime., Disp: 90 tablet, Rfl: 11   omeprazole (PRILOSEC) 40 MG capsule, Take 1 capsule (40 mg total) by mouth daily., Disp: 90 capsule, Rfl: 0   simvastatin (ZOCOR) 20 MG tablet, Take 1 tablet (20 mg total) by mouth at bedtime., Disp: 90 tablet, Rfl: 0   SYMBICORT 160-4.5 MCG/ACT inhaler, Use 2 puffs 1-2 times daily as directed. (Patient not taking: Reported on 11/13/2022), Disp: 10.2 g, Rfl: 11   topiramate (TOPAMAX) 50 MG tablet, Take 1 tablet (50 mg total) by mouth 2 (two) times daily., Disp: 180 tablet, Rfl: 0   Triamcinolone Acetonide (NASACORT AQ NA), Place 1 spray into the nose 2 (two) times daily. Uses as needed., Disp: , Rfl:    Vitamin D, Ergocalciferol, (DRISDOL) 1.25 MG (50000 UNIT) CAPS capsule, Take 50,000 Units by mouth once a week., Disp: , Rfl:  Social History   Socioeconomic History   Marital status: Divorced    Spouse name: Not on file   Number of children: 0   Years of education: Not on file   Highest education level: Associate degree: occupational, Scientist, product/process development, or vocational program  Occupational History  Not on file  Tobacco Use   Smoking status: Never   Smokeless tobacco: Never  Vaping Use   Vaping Use: Never used  Substance and Sexual Activity   Alcohol use: Yes    Alcohol/week: 0.0 standard drinks of alcohol    Comment: Rare   Drug use: No   Sexual activity: Yes    Birth control/protection: Post-menopausal    Comment: 1st intercourse 16 yo-5 partners  Other Topics Concern   Not on file  Social History Narrative   Legally separated from spouse, amicable relationship. Reside in separate households   No children   Works at [employer]   Social Determinants of [employer]: Not on file  Food Insecurity: No Food Insecurity (02/17/2023)   Hunger  Vital Sign    Worried About Running Out of Food in the Last Year: Never true    Ran Out of Food in the Last Year: Never true  Transportation Needs: No Transportation Needs (02/17/2023)   PRAPARE - Administrator, Civil Service (Medical): No    Lack of Transportation (Non-Medical): No  Physical Activity: Sufficiently Active (02/17/2023)   Exercise Vital Sign    Days of Exercise per Week: 5 days    Minutes of Exercise per Session: 40 min  Stress: No Stress Concern Present (02/17/2023)   [employer] of Occupational Health - Occupational Stress Questionnaire    Feeling of Stress : Only a little  Social Connections: Moderately Integrated (02/17/2023)   Social Connection and Isolation Panel [NHANES]    Frequency of Communication with Friends and Family: More than three times a week    Frequency of Social Gatherings with Friends and Family: More than three times a week    Attends Religious Services: More than 4 times per year    Active Member of [employer] or Organizations: Yes    Attends Banker Meetings: 1 to 4 times per year    Marital Status: Divorced  Catering manager Violence: Not on file   Family History  Problem Relation Age of Onset   Cancer Mother 56       Uterine   Thyroid disease Mother    Cancer Father        Lung   Breast cancer Maternal Grandmother        Age 19's   Diabetes Maternal Grandfather    Heart disease Maternal Grandfather    Colon cancer Neg Hx    Esophageal cancer Neg Hx    Rectal cancer Neg Hx    Stomach cancer Neg Hx    Allergic rhinitis Neg Hx    Asthma Neg Hx    Eczema Neg Hx    Urticaria Neg Hx    Angioedema Neg Hx    Atopy Neg Hx    Immunodeficiency Neg Hx    Colon polyps Neg Hx    Crohn's disease Neg Hx    Ulcerative colitis Neg Hx     Objective: Office vital signs reviewed. BP 105/69   Pulse 73   Temp 98.3 F (36.8 C) (Temporal)   Ht 5\' 2"  (1.575 m)   Wt 144 lb 3.2 oz (65.4 kg)   LMP 05/26/2014   SpO2 97%   BMI  26.37 kg/m   Physical Examination:  General: Awake, alert, well nourished, No acute distress HEENT: sclera white, MMM.  TMs intact bilaterally with normal light reflex.  Nares normal.  Oropharynx without lesions. Cardio: regular rate and rhythm, S1S2 heard, no murmurs  appreciated Pulm: clear to auscultation bilaterally, no wheezes, rhonchi or rales; normal work of breathing on room air GI: soft, non-tender, non-distended, bowel sounds present x4, no hepatomegaly, no splenomegaly, no masses GU: No uterine abnormalities appreciated externally Extremities: warm, well perfused, No edema, cyanosis or clubbing; +2 pulses bilaterally MSK: normal gait and station Skin: dry; intact; no rashes.  She has several seborrheic keratoses noted along the left flank just beneath the bra line Neuro: No tremor appreciated Psych: Mood stable, speech normal, affect appropriate     02/19/2023    3:33 PM 09/05/2021    8:12 AM 04/20/2021    8:02 AM  Depression screen PHQ 2/9  Decreased Interest 0 0 0  Down, Depressed, Hopeless 0 0 0  PHQ - 2 Score 0 0 0  Altered sleeping 0    Tired, decreased energy 0    Change in appetite 0    Feeling bad or failure about yourself  0    Trouble concentrating 0    Moving slowly or fidgety/restless 0    Suicidal thoughts 0    PHQ-9 Score 0    Difficult doing work/chores Not difficult at all       Assessment/ Plan: 63 y.o. female   Hypothyroidism due to acquired atrophy of thyroid - Plan: levothyroxine (SYNTHROID) 100 MCG tablet  Mixed hyperlipidemia - Plan: simvastatin (ZOCOR) 20 MG tablet, CT CARDIAC SCORING (SELF PAY ONLY)  Family history of heart disease - Plan: CT CARDIAC SCORING (SELF PAY ONLY)  Gastroesophageal reflux disease without esophagitis - Plan: omeprazole (PRILOSEC) 40 MG capsule  Chronic bilateral low back pain without sciatica - Plan: cyclobenzaprine (FLEXERIL) 10 MG tablet  Migraine without aura and without status migrainosus, not intractable -  Plan: topiramate (TOPAMAX) 50 MG tablet  Seborrheic keratoses  Thyroid under good control.  Synthroid renewed.  Continue regular eye checkups  Lipid is slightly elevated from an LDL standpoint but given the excellent control of her HDL I think that this is likely largely compensated for.  I am going to proceed with coronary artery calcium scoring for her given family history and to determine if she has ongoing need for Zocor  PPI renewed.  GERD is chronic and stable  Flexeril renewed.  Migraines are chronic and stable.  Topamax renewed.  CBC and CMP were appropriate  Lesions of concern are consistent with seborrheic keratoses.  Discussed benign nature of this.  Information provided in AVS.  Discussed that they become inflamed or irritated glad to remove but otherwise that would be considered cosmetic  No orders of the defined types were placed in this encounter.  No orders of the defined types were placed in this encounter.    Raliegh Ip, DO [facility] [phone]

## 2023-03-03 ENCOUNTER — Ambulatory Visit (HOSPITAL_COMMUNITY)
Admission: RE | Admit: 2023-03-03 | Discharge: 2023-03-03 | Disposition: A | Discharge status: Home / Self Care | Payer: BC Managed Care – PPO | Source: Ambulatory Visit | Attending: Family Medicine | Admitting: Family Medicine

## 2023-03-03 DIAGNOSIS — Z8249 Family history of ischemic heart disease and other diseases of the circulatory system: Secondary | ICD-10-CM | POA: Insufficient documentation

## 2023-03-03 DIAGNOSIS — E782 Mixed hyperlipidemia: Secondary | ICD-10-CM | POA: Insufficient documentation

## 2023-03-05 ENCOUNTER — Other Ambulatory Visit: Payer: Self-pay | Admitting: Family Medicine

## 2023-03-05 DIAGNOSIS — R932 Abnormal findings on diagnostic imaging of liver and biliary tract: Secondary | ICD-10-CM

## 2023-03-10 ENCOUNTER — Ambulatory Visit (HOSPITAL_COMMUNITY)
Admission: RE | Admit: 2023-03-10 | Discharge: 2023-03-10 | Disposition: A | Discharge status: Home / Self Care | Payer: BC Managed Care – PPO | Source: Ambulatory Visit | Attending: Family Medicine | Admitting: Family Medicine

## 2023-03-10 DIAGNOSIS — K7689 Other specified diseases of liver: Secondary | ICD-10-CM | POA: Diagnosis not present

## 2023-03-10 DIAGNOSIS — Z9049 Acquired absence of other specified parts of digestive tract: Secondary | ICD-10-CM | POA: Diagnosis not present

## 2023-03-10 DIAGNOSIS — R932 Abnormal findings on diagnostic imaging of liver and biliary tract: Secondary | ICD-10-CM | POA: Diagnosis not present

## 2023-03-14 ENCOUNTER — Encounter: Payer: Self-pay | Admitting: Family Medicine

## 2023-03-20 ENCOUNTER — Encounter: Payer: Self-pay | Admitting: Gastroenterology

## 2023-09-05 ENCOUNTER — Encounter: Payer: Self-pay | Admitting: Family Medicine

## 2023-10-29 ENCOUNTER — Ambulatory Visit: Payer: BC Managed Care – PPO | Admitting: Nurse Practitioner

## 2023-10-29 ENCOUNTER — Ambulatory Visit (INDEPENDENT_AMBULATORY_CARE_PROVIDER_SITE_OTHER): Payer: BC Managed Care – PPO | Admitting: Nurse Practitioner

## 2023-10-29 ENCOUNTER — Encounter: Payer: Self-pay | Admitting: Nurse Practitioner

## 2023-10-29 VITALS — BP 118/68 | HR 74 | Resp 12 | Ht 61.0 in | Wt 157.0 lb

## 2023-10-29 DIAGNOSIS — M8580 Other specified disorders of bone density and structure, unspecified site: Secondary | ICD-10-CM | POA: Diagnosis not present

## 2023-10-29 DIAGNOSIS — Z01419 Encounter for gynecological examination (general) (routine) without abnormal findings: Secondary | ICD-10-CM | POA: Diagnosis not present

## 2023-10-29 DIAGNOSIS — Z780 Asymptomatic menopausal state: Secondary | ICD-10-CM | POA: Diagnosis not present

## 2023-10-29 NOTE — Progress Notes (Signed)
 Carol Chambers 10-05-1960 [id number]   History:  63 y.o. G1P0010 presents for annual exam without GYN complaints. Postmenopausal - no HRT, no bleeding. Normal pap and mammogram history. History of hypothyroidism and migraines.   Gynecologic History Patient's last menstrual period was 05/26/2014.   Contraception/Family planning: post menopausal status Sexually active: Yes  Health Maintenance Last Pap: 10/19/2019. Results were: Normal neg HPV, 5-year repeat Last mammogram: 10/28/2022. Results were: Normal Last colonoscopy: 12/10/2022. Results were: Normal, 10-year recall Last Dexa: 09/16/2022. Results were: Osteopenia  Past medical history, past surgical history, family history and social history were all reviewed and documented in the EPIC chart. Boyfriend. Health and Designer, industrial/product for [employer]. Mother with history of uterine cancer at age 40, MGM with history of breast cancer in her 72s.   ROS:  A ROS was performed and pertinent positives and negatives are included.  Exam:  Vitals:   10/29/23 0915  BP: 118/68  Pulse: 74  Resp: 12  Weight: 157 lb (71.2 kg)  Height: 5\' 1"  (1.549 m)      Body mass index is 29.66 kg/m.  General appearance:  Normal Thyroid:  Symmetrical, normal in size, without palpable masses or nodularity. Respiratory  Auscultation:  Clear without wheezing or rhonchi Cardiovascular  Auscultation:  Regular rate, without rubs, murmurs or gallops  Edema/varicosities:  Not grossly evident Abdominal  Soft,nontender, without masses, guarding or rebound.  Liver/spleen:  No organomegaly noted  Hernia:  None appreciated  Skin  Inspection:  Grossly normal   Breasts: Examined lying and sitting.   Right: Without masses, retractions, discharge or axillary adenopathy.   Left: Without masses, retractions, discharge or axillary adenopathy. Pelvic: External genitalia:  no lesions              Urethra:  normal appearing urethra with no masses, tenderness or  lesions              Bartholins and Skenes: normal                 Vagina: normal appearing vagina with normal color and discharge, no lesions              Cervix: no lesions Bimanual Exam:  Uterus:  no masses or tenderness              Adnexa: no mass, fullness, tenderness              Rectovaginal: Deferred              Anus:  normal, no lesions  Patient informed chaperone available to be present for breast and pelvic exam. Patient has requested no chaperone to be present. Patient has been advised what will be completed during breast and pelvic exam.   Assessment/Plan:  63 y.o. G1P0010 for annual exam.   Well female exam with routine gynecological exam - Education provided on SBEs, importance of preventative screenings, current guidelines, high calcium diet, regular exercise, and multivitamin daily. Labs with PCP.    Postmenopausal - no HRT, no bleeding  Osteopenia, unspecified location - managed by PCP.   Screening for cervical cancer - Normal Pap history.  Will repeat at 5-year interval per guidelines.  Screening for breast cancer - Normal mammogram history.  Continue annual screenings.  Normal breast exam today. Scheduled today.   Screening for colon cancer - 11/2022 colonoscopy. Will repeat at GI's recommended interval.   Return in about 1 year (around 10/28/2024) for Annual.     Olivia Mackie  WHNP, 9:31 AM 10/29/2023

## 2023-10-30 ENCOUNTER — Encounter: Payer: Self-pay | Admitting: Nurse Practitioner

## 2023-12-03 NOTE — Patient Instructions (Incomplete)
  1.  Continue to perform Allergen avoidance measures - grass / weed  2.  Continue to Treat and prevent inflammation:   A. Ryaltris 2 sprays in each nostril twice a day(Spring). This will replace Flonase for now In the right nostril, point the applicator out toward the right ear. In the left nostril, point the applicator out toward the left ear   B.  Montelukast  10 mg - 1 tablet 1 time per day   C.  Increase Symbicort  160 to 2 inhalations 2 times per day with spacer (Spring)  3.  Continue to treat reflux:   A.  omeprazole  40 mg tablet 1 time per day.   4. If needed:   A.  Nasal saline multiple times a day  B.  OTC levocetirizine 5 mg (Xyzal) 1-2 time per day. Replaces cetirizine for now  C.  Pro Air HFA or similar 2 inhalations every 4-6 hours  D.  OTC Mucinex 2 tablets twice a day  5. Follow up in 3 months or sooner if needed

## 2023-12-03 NOTE — Progress Notes (Signed)
   [address] [place] [address] Dept: [phone]  FOLLOW UP NOTE  Patient ID: Carol Chambers, female    DOB: 03-01-1961  Age: 63 y.o. MRN: [id number] Date of Office Visit: 12/04/2023  Assessment  Chief Complaint: No chief complaint on file.  HPI Carol Chambers is a 63 year old female who presents to the clinic for follow-up visit.  She was last seen in this clinic on 05/07/2022 by Marinus Sic, FNP, for evaluation of asthma, allergic rhinitis, and reflux.  Her last environmental allergy skin testing was on 10/28/2017 was positive to grass pollen and weed pollen.  Discussed the use of AI scribe software for clinical note transcription with the patient, who gave verbal consent to proceed.  History of Present Illness      Drug Allergies:  Allergies  Allergen Reactions   Sulfa Antibiotics Hives   Tequin Swelling    Swelling of tongue   Codeine Itching   Vicodin [Hydrocodone-Acetaminophen] Other (See Comments)    Hallucinations     Physical Exam: LMP 05/26/2014    Physical Exam  Diagnostics:    Assessment and Plan: No diagnosis found.  No orders of the defined types were placed in this encounter.   There are no Patient Instructions on file for this visit.  No follow-ups on file.    Thank you for the opportunity to care for this patient.  Please do not hesitate to contact me with questions.  Marinus Sic, FNP [facility]

## 2023-12-04 ENCOUNTER — Ambulatory Visit: Admitting: Family Medicine

## 2023-12-04 ENCOUNTER — Encounter: Payer: Self-pay | Admitting: Family Medicine

## 2023-12-04 ENCOUNTER — Other Ambulatory Visit: Payer: Self-pay

## 2023-12-04 VITALS — BP 100/64 | HR 86 | Temp 98.4°F | Ht 61.0 in | Wt 157.3 lb

## 2023-12-04 DIAGNOSIS — J4540 Moderate persistent asthma, uncomplicated: Secondary | ICD-10-CM | POA: Diagnosis not present

## 2023-12-04 DIAGNOSIS — J301 Allergic rhinitis due to pollen: Secondary | ICD-10-CM | POA: Diagnosis not present

## 2023-12-04 DIAGNOSIS — K219 Gastro-esophageal reflux disease without esophagitis: Secondary | ICD-10-CM | POA: Diagnosis not present

## 2023-12-04 MED ORDER — SYMBICORT 160-4.5 MCG/ACT IN AERO: INHALATION_SPRAY | RESPIRATORY_TRACT | 11 refills | Status: DC

## 2023-12-04 MED ORDER — SYMBICORT 160-4.5 MCG/ACT IN AERO: INHALATION_SPRAY | RESPIRATORY_TRACT | 5 refills | Status: DC

## 2023-12-08 ENCOUNTER — Other Ambulatory Visit (HOSPITAL_COMMUNITY): Payer: Self-pay

## 2023-12-08 ENCOUNTER — Telehealth: Payer: Self-pay

## 2023-12-08 NOTE — Telephone Encounter (Signed)
 Pharmacy Patient Advocate Encounter   Received notification from CoverMyMeds that prior authorization for Symbicort  160-4.5MCG/ACT aerosol  is required/requested.   Insurance verification completed.   The patient is insured through [facility] .   Per test claim: The current 30 day co-pay is, $25.  No PA needed at this time. This test claim was processed through [facility]- copay amounts may vary at other pharmacies due to pharmacy/plan contracts, or as the patient moves through the different stages of their insurance plan.

## 2023-12-09 ENCOUNTER — Encounter: Payer: Self-pay | Admitting: Family Medicine

## 2023-12-09 ENCOUNTER — Other Ambulatory Visit: Payer: Self-pay

## 2023-12-09 MED ORDER — BUDESONIDE-FORMOTEROL FUMARATE 160-4.5 MCG/ACT IN AERO: 2.0000 | INHALATION_SPRAY | Freq: Two times a day (BID) | RESPIRATORY_TRACT | 5 refills | Status: AC

## 2024-01-02 LAB — LAB REPORT - SCANNED
A1c: 5.3
EGFR (Non-African Amer.): 64
Free T4: 1.2 ng/dL
TSH: 2.04 (ref 0.41–5.90)

## 2024-01-30 ENCOUNTER — Other Ambulatory Visit: Payer: Self-pay | Admitting: Family Medicine

## 2024-01-30 DIAGNOSIS — E034 Atrophy of thyroid (acquired): Secondary | ICD-10-CM

## 2024-01-30 DIAGNOSIS — K219 Gastro-esophageal reflux disease without esophagitis: Secondary | ICD-10-CM

## 2024-02-20 ENCOUNTER — Encounter: Payer: Self-pay | Admitting: Family Medicine

## 2024-02-20 ENCOUNTER — Ambulatory Visit: Payer: BC Managed Care – PPO | Admitting: Family Medicine

## 2024-02-20 VITALS — BP 90/57 | HR 77 | Temp 98.0°F | Ht 61.75 in | Wt 152.4 lb

## 2024-02-20 DIAGNOSIS — E034 Atrophy of thyroid (acquired): Secondary | ICD-10-CM | POA: Diagnosis not present

## 2024-02-20 DIAGNOSIS — E782 Mixed hyperlipidemia: Secondary | ICD-10-CM | POA: Diagnosis not present

## 2024-02-20 DIAGNOSIS — K219 Gastro-esophageal reflux disease without esophagitis: Secondary | ICD-10-CM

## 2024-02-20 DIAGNOSIS — Z0001 Encounter for general adult medical examination with abnormal findings: Secondary | ICD-10-CM | POA: Diagnosis not present

## 2024-02-20 DIAGNOSIS — Z0000 Encounter for general adult medical examination without abnormal findings: Secondary | ICD-10-CM

## 2024-02-20 DIAGNOSIS — M545 Low back pain, unspecified: Secondary | ICD-10-CM

## 2024-02-20 DIAGNOSIS — G8929 Other chronic pain: Secondary | ICD-10-CM

## 2024-02-20 DIAGNOSIS — Z23 Encounter for immunization: Secondary | ICD-10-CM

## 2024-02-20 DIAGNOSIS — G43009 Migraine without aura, not intractable, without status migrainosus: Secondary | ICD-10-CM

## 2024-02-20 MED ORDER — TOPIRAMATE 50 MG PO TABS: 50.0000 mg | ORAL_TABLET | Freq: Two times a day (BID) | ORAL | 4 refills | Status: AC

## 2024-02-20 MED ORDER — MONTELUKAST SODIUM 10 MG PO TABS: 10.0000 mg | ORAL_TABLET | Freq: Every day | ORAL | 4 refills | Status: AC

## 2024-02-20 MED ORDER — CYCLOBENZAPRINE HCL 10 MG PO TABS
ORAL_TABLET | ORAL | 0 refills | Status: AC
Start: 2024-02-20 — End: ?

## 2024-02-20 MED ORDER — LEVOTHYROXINE SODIUM 100 MCG PO TABS: 100.0000 ug | ORAL_TABLET | Freq: Every day | ORAL | 4 refills | Status: AC

## 2024-02-20 MED ORDER — OMEPRAZOLE 40 MG PO CPDR: 40.0000 mg | DELAYED_RELEASE_CAPSULE | Freq: Every day | ORAL | 4 refills | Status: AC

## 2024-02-20 NOTE — Progress Notes (Signed)
 Carol Chambers is a 63 y.o. female presents to office today for annual physical exam examination.    Concerns today include: 1. None.  She is doing well.  She started a medication for weight loss in March and that seems to be working well for her.  Denies any side effects.  She had labs done at an outside facility and has those for me to review.  She is compliant with all medications and needs refills on all except for what is managed by her allergist and weight loss specialist  Occupation: works for [employer], Substance use: none Health Maintenance Due  Topic Date Due   Pneumococcal Vaccine 33-73 Years old (1 of 2 - PCV) Never done   COVID-19 Vaccine (4 - 2024-25 season) 04/13/2023   Refills needed today: all  Immunization History  Administered Date(s) Administered   Influenza Inj Mdck Quad Pf 05/28/2021   Influenza,inj,Quad PF,6+ Mos 06/07/2019   Influenza-Unspecified 06/24/2016, 06/16/2017, 06/05/2020   Moderna Sars-Covid-2 Vaccination 10/18/2019, 11/11/2019, 12/13/2019   Tdap 04/20/2021   Zoster Recombinant(Shingrix) 11/06/2020, 01/17/2021   Past Medical History:  Diagnosis Date   Acid reflux    Allergy    SEASONAL   Anemia    ASCUS of cervix with negative high risk HPV 10/2018   Asthma    History of kidney stones    Hypercholesteremia    Hypothyroidism    PONV (postoperative nausea and vomiting)    with Cholecystectomy   Seasonal allergies    Social History   Socioeconomic History   Marital status: Divorced    Spouse name: Not on file   Number of children: 0   Years of education: Not on file   Highest education level: Associate degree: occupational, Scientist, product/process development, or vocational program  Occupational History   Not on file  Tobacco Use   Smoking status: Never   Smokeless tobacco: Never  Vaping Use   Vaping status: Never Used  Substance and Sexual Activity   Alcohol use: Yes    Alcohol/week: 0.0 standard drinks of alcohol    Comment: Rare   Drug use: No    Sexual activity: Yes    Birth control/protection: Post-menopausal    Comment: 1st intercourse 35 yo-5 partners  Other Topics Concern   Not on file  Social History Narrative   Legally separated from spouse, amicable relationship. Reside in separate households   No children   Works at [employer]   Social Drivers of [employer]: Not on file  Food Insecurity: No Food Insecurity (02/17/2023)   Hunger Vital Sign    Worried About Running Out of Food in the Last Year: Never true    Ran Out of Food in the Last Year: Never true  Transportation Needs: No Transportation Needs (02/17/2023)   PRAPARE - Administrator, Civil Service (Medical): No    Lack of Transportation (Non-Medical): No  Physical Activity: Sufficiently Active (02/17/2023)   Exercise Vital Sign    Days of Exercise per Week: 5 days    Minutes of Exercise per Session: 40 min  Stress: No Stress Concern Present (02/17/2023)   [employer] of Occupational Health - Occupational Stress Questionnaire    Feeling of Stress : Only a little  Social Connections: Moderately Integrated (02/17/2023)   Social Connection and Isolation Panel    Frequency of Communication with Friends and Family: More than three times a week    Frequency of Social Gatherings with Friends and Family: More  than three times a week    Attends Religious Services: More than 4 times per year    Active Member of Clubs or Organizations: Yes    Attends Banker Meetings: 1 to 4 times per year    Marital Status: Divorced  Catering manager Violence: Not on file   Past Surgical History:  Procedure Laterality Date   BRAVO PH STUDY N/A 08/23/2014   Procedure: BRAVO PH STUDY;  Surgeon: Lamar JONETTA Aho, MD;  Location: [facility];  Service: Endoscopy;  Laterality: N/A;  48 hour bravo pH study with impedance plethysmography    BRAVO PH STUDY N/A 10/10/2014   Procedure: BRAVO PH STUDY;  Surgeon: Lamar JONETTA Aho, MD;   Location: [facility];  Service: Endoscopy;  Laterality: N/A;  2nd attempt   CARPAL TUNNEL RELEASE  2003   bilat   COLONOSCOPY     ESOPHAGOGASTRODUODENOSCOPY (EGD) WITH PROPOFOL  N/A 08/23/2014   Procedure: ESOPHAGOGASTRODUODENOSCOPY (EGD) WITH PROPOFOL ;  Surgeon: Lamar JONETTA Aho, MD;  Location: [facility];  Service: Endoscopy;  Laterality: N/A;   ESOPHAGOGASTRODUODENOSCOPY (EGD) WITH PROPOFOL  N/A 10/10/2014   Procedure: ESOPHAGOGASTRODUODENOSCOPY (EGD) WITH PROPOFOL ;  Surgeon: Lamar JONETTA Aho, MD;  Location: [facility];  Service: Endoscopy;  Laterality: N/A;   Esophagus stretched     X 2   FOOT SURGERY     03-24-14 Plantar Fasciatitis   FOOT SURGERY Left    foot surgery 09-29-14   groin cyst removed  2000   right   LAPAROSCOPIC CHOLECYSTECTOMY  2003   Family History  Problem Relation Age of Onset   Cancer Mother 56       Uterine   Thyroid  disease Mother    Cancer Father        Lung   Breast cancer Maternal Grandmother        Age 35's   Diabetes Maternal Grandfather    Heart disease Maternal Grandfather    Colon cancer Neg Hx    Esophageal cancer Neg Hx    Rectal cancer Neg Hx    Stomach cancer Neg Hx    Allergic rhinitis Neg Hx    Asthma Neg Hx    Eczema Neg Hx    Urticaria Neg Hx    Angioedema Neg Hx    Atopy Neg Hx    Immunodeficiency Neg Hx    Colon polyps Neg Hx    Crohn's disease Neg Hx    Ulcerative colitis Neg Hx     Current Outpatient Medications:    budesonide -formoterol  (SYMBICORT ) 160-4.5 MCG/ACT inhaler, Inhale 2 puffs into the lungs 2 (two) times daily., Disp: 10.2 g, Rfl: 5   cyclobenzaprine  (FLEXERIL ) 10 MG tablet, TAKE 1 TABLET THREE TIMES A DAY AS NEEDED FOR MUSCLE SPASMS, Disp: 180 tablet, Rfl: 0   cycloSPORINE (RESTASIS) 0.05 % ophthalmic emulsion, Restasis 0.05 % eye drops in a dropperette (Patient not taking: Reported on 12/04/2023), Disp: , Rfl:    Diethylpropion HCl CR 75 MG TB24, Take 1 tablet by mouth daily., Disp: , Rfl:    levothyroxine   (SYNTHROID ) 100 MCG tablet, Take 1 tablet by mouth daily., Disp: 30 tablet, Rfl: 0   Magnesium 250 MG TABS, Take 2 tablets by mouth daily., Disp: , Rfl:    montelukast  (SINGULAIR ) 10 MG tablet, Take 1 tablet by mouth at bedtime., Disp: 90 tablet, Rfl: 2   omeprazole  (PRILOSEC) 40 MG capsule, Take 1 capsule by mouth daily., Disp: 30 capsule, Rfl: 0   simvastatin  (ZOCOR ) 20 MG tablet, Take 1 tablet (20  mg total) by mouth at bedtime., Disp: 90 tablet, Rfl: 3   topiramate  (TOPAMAX ) 50 MG tablet, Take 1 tablet (50 mg total) by mouth 2 (two) times daily., Disp: 180 tablet, Rfl: 3   Triamcinolone Acetonide (NASACORT AQ NA), Place 1 spray into the nose 2 (two) times daily. Uses as needed., Disp: , Rfl:    Vitamin D , Ergocalciferol , (DRISDOL) 1.25 MG (50000 UNIT) CAPS capsule, Take 50,000 Units by mouth once a week., Disp: , Rfl:   Allergies  Allergen Reactions   Sulfa Antibiotics Hives   Tequin Swelling    Swelling of tongue   Codeine Itching   Vicodin [Hydrocodone-Acetaminophen] Other (See Comments)    Hallucinations      ROS: Review of Systems Pertinent items noted in HPI and remainder of comprehensive ROS otherwise negative.    Physical exam BP (!) 90/57   Pulse 77   Temp 98 F (36.7 C) (Temporal)   Ht 5' 1.75 (1.568 m)   Wt 152 lb 6.4 oz (69.1 kg)   LMP 05/26/2014   SpO2 99%   BMI 28.10 kg/m  General appearance: alert, cooperative, appears stated age, and no distress Head: Normocephalic, without obvious abnormality, atraumatic Eyes: negative findings: lids and lashes normal, conjunctivae and sclerae normal, corneas clear, and pupils equal, round, reactive to light and accomodation Ears: normal TM's and external ear canals both ears Nose: Nares normal. Septum midline. Mucosa normal. No drainage or sinus tenderness. Throat: lips, mucosa, and tongue normal; teeth and gums normal Neck: no adenopathy, no carotid bruit, supple, symmetrical, trachea midline, and thyroid  not enlarged,  symmetric, no tenderness/mass/nodules Back: symmetric, no curvature. ROM normal. No CVA tenderness. Lungs: clear to auscultation bilaterally Heart: regular rate and rhythm, S1, S2 normal, no murmur, click, rub or gallop Abdomen: soft, non-tender; bowel sounds normal; no masses,  no organomegaly Extremities: extremities normal, atraumatic, no cyanosis or edema Pulses: 2+ and symmetric Skin: Skin color, texture, turgor normal. No rashes or lesions Lymph nodes: Cervical, supraclavicular, and axillary nodes normal. Neurologic: Grossly normal      10/29/2023    9:18 AM 02/19/2023    3:33 PM 09/05/2021    8:12 AM  Depression screen PHQ 2/9  Decreased Interest 0 0 0  Down, Depressed, Hopeless 0 0 0  PHQ - 2 Score 0 0 0  Altered sleeping  0   Tired, decreased energy  0   Change in appetite  0   Feeling bad or failure about yourself   0   Trouble concentrating  0   Moving slowly or fidgety/restless  0   Suicidal thoughts  0   PHQ-9 Score  0   Difficult doing work/chores  Not difficult at all       02/19/2023    3:33 PM 09/05/2021    8:12 AM 09/17/2019   12:06 PM  GAD 7 : Generalized Anxiety Score  Nervous, Anxious, on Edge 0 0 0  Control/stop worrying 0 0 0  Worry too much - different things 0 0 1  Trouble relaxing 0 0 0  Restless 0 0 0  Easily annoyed or irritable 0 0 1  Afraid - awful might happen 0 0 0  Total GAD 7 Score 0 0 2  Anxiety Difficulty Not difficult at all Not difficult at all Not difficult at all            Assessment/ Plan: Reda JONETTA Ill here for annual physical exam.   Annual physical exam  Hypothyroidism due to acquired atrophy  of thyroid  - Plan: levothyroxine  (SYNTHROID ) 100 MCG tablet, CANCELED: CMP14+EGFR, CANCELED: TSH + free T4  Mixed hyperlipidemia - Plan: CANCELED: CMP14+EGFR, CANCELED: Lipid Panel  Gastroesophageal reflux disease without esophagitis - Plan: omeprazole  (PRILOSEC) 40 MG capsule, CANCELED: CMP14+EGFR, CANCELED: CBC  Migraine  without aura and without status migrainosus, not intractable - Plan: topiramate  (TOPAMAX ) 50 MG tablet, CANCELED: CMP14+EGFR, CANCELED: CBC  Chronic bilateral low back pain without sciatica - Plan: cyclobenzaprine  (FLEXERIL ) 10 MG tablet  Need for vaccination against Streptococcus pneumoniae using pneumococcal conjugate vaccine 13 - Plan: Pneumococcal conjugate vaccine 20-valent (Prevnar 20)  Pneumococcal vaccination administered.  Fasting labs canceled and I will scanned in her very comprehensive lab results which were within normal range except for LDL of 167 with HDL of 76.  She had coronary artery calcium scoring done last year and this allowed her to DC statin.  She continues with diet and exercise for weight loss  GERD is stable with PPI.  Migraines well-controlled with Topamax   Back pain chronic and stable.  Flexeril  renewed for as needed use  Counseled on healthy lifestyle choices, including diet (rich in fruits, vegetables and lean meats and low in salt and simple carbohydrates) and exercise (at least 30 minutes of moderate physical activity daily).  Patient to follow up 1 year for CPE  Jshaun Abernathy M. Jolinda, DO

## 2024-02-20 NOTE — Patient Instructions (Signed)
 Preventive Care 16-61 Years Old, Female  Preventive care refers to lifestyle choices and visits with your health care provider that can promote health and wellness. Preventive care visits are also called wellness exams.  What can I expect for my preventive care visit?  Counseling  Your health care provider may ask you questions about your:  Medical history, including:  Past medical problems.  Family medical history.  Pregnancy history.  Current health, including:  Menstrual cycle.  Method of birth control.  Emotional well-being.  Home life and relationship well-being.  Sexual activity and sexual health.  Lifestyle, including:  Alcohol, nicotine or tobacco, and drug use.  Access to firearms.  Diet, exercise, and sleep habits.  Work and work Astronomer.  Sunscreen use.  Safety issues such as seatbelt and bike helmet use.  Physical exam  Your health care provider will check your:  Height and weight. These may be used to calculate your BMI (body mass index). BMI is a measurement that tells if you are at a healthy weight.  Waist circumference. This measures the distance around your waistline. This measurement also tells if you are at a healthy weight and may help predict your risk of certain diseases, such as type 2 diabetes and high blood pressure.  Heart rate and blood pressure.  Body temperature.  Skin for abnormal spots.  What immunizations do I need?    Vaccines are usually given at various ages, according to a schedule. Your health care provider will recommend vaccines for you based on your age, medical history, and lifestyle or other factors, such as travel or where you work.  What tests do I need?  Screening  Your health care provider may recommend screening tests for certain conditions. This may include:  Lipid and cholesterol levels.  Diabetes screening. This is done by checking your blood sugar (glucose) after you have not eaten for a while (fasting).  Pelvic exam and Pap test.  Hepatitis B test.  Hepatitis C  test.  HIV (human immunodeficiency virus) test.  STI (sexually transmitted infection) testing, if you are at risk.  Lung cancer screening.  Colorectal cancer screening.  Mammogram. Talk with your health care provider about when you should start having regular mammograms. This may depend on whether you have a family history of breast cancer.  BRCA-related cancer screening. This may be done if you have a family history of breast, ovarian, tubal, or peritoneal cancers.  Bone density scan. This is done to screen for osteoporosis.  Talk with your health care provider about your test results, treatment options, and if necessary, the need for more tests.  Follow these instructions at home:  Eating and drinking    Eat a diet that includes fresh fruits and vegetables, whole grains, lean protein, and low-fat dairy products.  Take vitamin and mineral supplements as recommended by your health care provider.  Do not drink alcohol if:  Your health care provider tells you not to drink.  You are pregnant, may be pregnant, or are planning to become pregnant.  If you drink alcohol:  Limit how much you have to 0-1 drink a day.  Know how much alcohol is in your drink. In the [place] one drink equals one 12 oz bottle of beer (355 mL), one 5 oz glass of wine (148 mL), or one 1 oz glass of hard liquor (44 mL).  Lifestyle  Brush your teeth every morning and night with fluoride toothpaste. Floss one time each day.  Exercise for at least  30 minutes 5 or more days each week.  Do not use any products that contain nicotine or tobacco. These products include cigarettes, chewing tobacco, and vaping devices, such as e-cigarettes. If you need help quitting, ask your health care provider.  Do not use drugs.  If you are sexually active, practice safe sex. Use a condom or other form of protection to prevent STIs.  If you do not wish to become pregnant, use a form of birth control. If you plan to become pregnant, see your health care provider for a  prepregnancy visit.  Take aspirin only as told by your health care provider. Make sure that you understand how much to take and what form to take. Work with your health care provider to find out whether it is safe and beneficial for you to take aspirin daily.  Find healthy ways to manage stress, such as:  Meditation, yoga, or listening to music.  Journaling.  Talking to a trusted person.  Spending time with friends and family.  Minimize exposure to UV radiation to reduce your risk of skin cancer.  Safety  Always wear your seat belt while driving or riding in a vehicle.  Do not drive:  If you have been drinking alcohol. Do not ride with someone who has been drinking.  When you are tired or distracted.  While texting.  If you have been using any mind-altering substances or drugs.  Wear a helmet and other protective equipment during sports activities.  If you have firearms in your house, make sure you follow all gun safety procedures.  Seek help if you have been physically or sexually abused.  What's next?  Visit your health care provider once a year for an annual wellness visit.  Ask your health care provider how often you should have your eyes and teeth checked.  Stay up to date on all vaccines.  This information is not intended to replace advice given to you by your health care provider. Make sure you discuss any questions you have with your health care provider.  Document Revised: 01/24/2021 Document Reviewed: 01/24/2021  Elsevier Patient Education  2024 [employer].

## 2024-07-12 ENCOUNTER — Encounter: Payer: Self-pay | Admitting: Family Medicine

## 2024-07-12 ENCOUNTER — Other Ambulatory Visit: Payer: Self-pay | Admitting: Family Medicine

## 2024-07-12 DIAGNOSIS — E782 Mixed hyperlipidemia: Secondary | ICD-10-CM

## 2024-07-12 MED ORDER — SIMVASTATIN 20 MG PO TABS: 20.0000 mg | ORAL_TABLET | Freq: Every day | ORAL | 3 refills | Status: AC

## 2024-08-25 ENCOUNTER — Encounter: Payer: Self-pay | Admitting: Family Medicine

## 2024-08-25 DIAGNOSIS — E034 Atrophy of thyroid (acquired): Secondary | ICD-10-CM

## 2024-08-27 ENCOUNTER — Other Ambulatory Visit

## 2024-08-27 ENCOUNTER — Other Ambulatory Visit: Payer: Self-pay | Admitting: Family Medicine

## 2024-08-27 DIAGNOSIS — E034 Atrophy of thyroid (acquired): Secondary | ICD-10-CM

## 2024-08-28 LAB — TSH+FREE T4
Free T4: 1.02 ng/dL (ref 0.82–1.77)
TSH: 4.36 u[IU]/mL (ref 0.450–4.500)

## 2024-08-30 ENCOUNTER — Ambulatory Visit: Payer: Self-pay | Admitting: Family Medicine

## 2024-08-30 ENCOUNTER — Encounter: Payer: Self-pay | Admitting: Family Medicine

## 2024-11-01 ENCOUNTER — Ambulatory Visit: Admitting: Nurse Practitioner

## 2024-11-04 ENCOUNTER — Ambulatory Visit: Admitting: Family Medicine

## 2024-12-28 ENCOUNTER — Ambulatory Visit: Admitting: Allergy and Immunology

## 2025-02-21 ENCOUNTER — Encounter: Payer: Self-pay | Admitting: Family Medicine
